# Patient Record
Sex: Female | Born: 1940 | Race: White | Hispanic: No | Smoking: Former smoker
Health system: Southern US, Community
[De-identification: ages and names within clinical notes are randomized; demographics above are authoritative.]

## PROBLEM LIST (undated history)

## (undated) DIAGNOSIS — K589 Irritable bowel syndrome without diarrhea: Secondary | ICD-10-CM

## (undated) DIAGNOSIS — K5790 Diverticulosis of intestine, part unspecified, without perforation or abscess without bleeding: Secondary | ICD-10-CM

## (undated) DIAGNOSIS — H04129 Dry eye syndrome of unspecified lacrimal gland: Secondary | ICD-10-CM

## (undated) DIAGNOSIS — Z5189 Encounter for other specified aftercare: Secondary | ICD-10-CM

## (undated) DIAGNOSIS — K449 Diaphragmatic hernia without obstruction or gangrene: Secondary | ICD-10-CM

## (undated) DIAGNOSIS — N3281 Overactive bladder: Secondary | ICD-10-CM

## (undated) DIAGNOSIS — M6289 Other specified disorders of muscle: Secondary | ICD-10-CM

## (undated) DIAGNOSIS — K861 Other chronic pancreatitis: Secondary | ICD-10-CM

## (undated) DIAGNOSIS — M4135 Thoracogenic scoliosis, thoracolumbar region: Secondary | ICD-10-CM

## (undated) DIAGNOSIS — K047 Periapical abscess without sinus: Secondary | ICD-10-CM

## (undated) DIAGNOSIS — K859 Acute pancreatitis without necrosis or infection, unspecified: Secondary | ICD-10-CM

## (undated) DIAGNOSIS — R682 Dry mouth, unspecified: Secondary | ICD-10-CM

## (undated) DIAGNOSIS — K219 Gastro-esophageal reflux disease without esophagitis: Secondary | ICD-10-CM

## (undated) DIAGNOSIS — H409 Unspecified glaucoma: Secondary | ICD-10-CM

## (undated) DIAGNOSIS — K922 Gastrointestinal hemorrhage, unspecified: Secondary | ICD-10-CM

## (undated) DIAGNOSIS — N828 Other female genital tract fistulae: Secondary | ICD-10-CM

## (undated) DIAGNOSIS — IMO0001 Reserved for inherently not codable concepts without codable children: Secondary | ICD-10-CM

## (undated) DIAGNOSIS — K648 Other hemorrhoids: Secondary | ICD-10-CM

## (undated) DIAGNOSIS — I10 Essential (primary) hypertension: Secondary | ICD-10-CM

## (undated) DIAGNOSIS — K562 Volvulus: Secondary | ICD-10-CM

## (undated) DIAGNOSIS — J9601 Acute respiratory failure with hypoxia: Secondary | ICD-10-CM

## (undated) DIAGNOSIS — N3289 Other specified disorders of bladder: Secondary | ICD-10-CM

## (undated) HISTORY — DX: Other specified disorders of bladder: N32.89

## (undated) HISTORY — DX: Encounter for other specified aftercare: Z51.89

## (undated) HISTORY — PX: HIATAL HERNIA REPAIR: SHX195

## (undated) HISTORY — DX: Acute respiratory failure with hypoxia: J96.01

## (undated) HISTORY — DX: Gastro-esophageal reflux disease without esophagitis: K21.9

## (undated) HISTORY — DX: Volvulus: K56.2

## (undated) HISTORY — DX: Irritable bowel syndrome, unspecified: K58.9

## (undated) HISTORY — DX: Diverticulosis of intestine, part unspecified, without perforation or abscess without bleeding: K57.90

## (undated) HISTORY — DX: Dry eye syndrome of unspecified lacrimal gland: H04.129

## (undated) HISTORY — PX: MOUTH SURGERY: SHX715

## (undated) HISTORY — DX: Essential (primary) hypertension: I10

## (undated) HISTORY — PX: NISSEN FUNDOPLICATION: SHX2091

## (undated) HISTORY — PX: EYE SURGERY: SHX253

## (undated) HISTORY — DX: Thoracogenic scoliosis, thoracolumbar region: M41.35

## (undated) HISTORY — DX: Overactive bladder: N32.81

## (undated) HISTORY — PX: CATARACT EXTRACTION: SUR2

## (undated) HISTORY — DX: Other specified disorders of muscle: M62.89

## (undated) HISTORY — PX: BAND HEMORRHOIDECTOMY: SHX1213

## (undated) HISTORY — DX: Other hemorrhoids: K64.8

## (undated) HISTORY — PX: STAPLE HEMORRHOIDECTOMY: SHX2438

## (undated) HISTORY — DX: Diaphragmatic hernia without obstruction or gangrene: K44.9

## (undated) HISTORY — DX: Gastrointestinal hemorrhage, unspecified: K92.2

## (undated) HISTORY — DX: Reserved for inherently not codable concepts without codable children: IMO0001

## (undated) HISTORY — DX: Other chronic pancreatitis: K86.1

---

## 1998-12-12 ENCOUNTER — Encounter: Payer: Self-pay | Admitting: Gastroenterology

## 1998-12-12 ENCOUNTER — Ambulatory Visit (HOSPITAL_COMMUNITY): Admission: RE | Admit: 1998-12-12 | Discharge: 1998-12-12 | Payer: Self-pay | Admitting: Gastroenterology

## 1999-02-27 ENCOUNTER — Ambulatory Visit (HOSPITAL_COMMUNITY): Admission: RE | Admit: 1999-02-27 | Discharge: 1999-02-27 | Payer: Self-pay | Admitting: Gastroenterology

## 1999-02-27 ENCOUNTER — Encounter: Payer: Self-pay | Admitting: Gastroenterology

## 1999-04-26 ENCOUNTER — Other Ambulatory Visit: Admission: RE | Admit: 1999-04-26 | Discharge: 1999-04-26 | Payer: Self-pay | Admitting: Family Medicine

## 1999-06-04 ENCOUNTER — Encounter: Payer: Self-pay | Admitting: Emergency Medicine

## 1999-06-04 ENCOUNTER — Emergency Department (HOSPITAL_COMMUNITY): Admission: EM | Admit: 1999-06-04 | Discharge: 1999-06-04 | Payer: Self-pay | Admitting: Emergency Medicine

## 2000-02-16 ENCOUNTER — Other Ambulatory Visit: Admission: RE | Admit: 2000-02-16 | Discharge: 2000-02-16 | Payer: Self-pay | Admitting: Family Medicine

## 2000-03-20 ENCOUNTER — Other Ambulatory Visit: Admission: RE | Admit: 2000-03-20 | Discharge: 2000-03-20 | Payer: Self-pay | Admitting: Radiology

## 2000-03-26 ENCOUNTER — Other Ambulatory Visit: Admission: RE | Admit: 2000-03-26 | Discharge: 2000-03-26 | Payer: Self-pay | Admitting: Radiology

## 2000-09-10 HISTORY — PX: BLADDER SUSPENSION: SHX72

## 2000-09-10 HISTORY — PX: VAGINAL HYSTERECTOMY: SUR661

## 2000-11-26 ENCOUNTER — Encounter: Payer: Self-pay | Admitting: Obstetrics and Gynecology

## 2000-12-02 ENCOUNTER — Encounter (INDEPENDENT_AMBULATORY_CARE_PROVIDER_SITE_OTHER): Payer: Self-pay

## 2000-12-02 ENCOUNTER — Inpatient Hospital Stay (HOSPITAL_COMMUNITY): Admission: RE | Admit: 2000-12-02 | Discharge: 2000-12-05 | Payer: Self-pay | Admitting: Obstetrics and Gynecology

## 2005-07-23 ENCOUNTER — Ambulatory Visit: Payer: Self-pay | Admitting: Gastroenterology

## 2007-01-29 ENCOUNTER — Ambulatory Visit: Payer: Self-pay | Admitting: Gastroenterology

## 2007-05-23 ENCOUNTER — Encounter: Admission: RE | Admit: 2007-05-23 | Discharge: 2007-05-23 | Payer: Self-pay | Admitting: Family Medicine

## 2007-11-17 ENCOUNTER — Ambulatory Visit: Payer: Self-pay | Admitting: Internal Medicine

## 2007-11-24 LAB — CONVERTED CEMR LAB
Basophils Absolute: 0 10*3/uL (ref 0.0–0.1)
Eosinophils Absolute: 0.1 10*3/uL (ref 0.0–0.6)
HCT: 41.2 % (ref 36.0–46.0)
Iron: 146 ug/dL — ABNORMAL HIGH (ref 42–145)
MCHC: 34.2 g/dL (ref 30.0–36.0)
MCV: 92.2 fL (ref 78.0–100.0)
Monocytes Relative: 8.5 % (ref 3.0–11.0)
Neutrophils Relative %: 74.8 % (ref 43.0–77.0)
Platelets: 239 10*3/uL (ref 150–400)
RBC: 4.47 M/uL (ref 3.87–5.11)
Saturation Ratios: 42.9 % (ref 20.0–50.0)

## 2008-01-20 ENCOUNTER — Ambulatory Visit: Payer: Self-pay | Admitting: Internal Medicine

## 2008-02-03 ENCOUNTER — Encounter: Payer: Self-pay | Admitting: Internal Medicine

## 2008-02-03 ENCOUNTER — Emergency Department (HOSPITAL_COMMUNITY): Admission: EM | Admit: 2008-02-03 | Discharge: 2008-02-04 | Payer: Self-pay | Admitting: Emergency Medicine

## 2008-02-03 ENCOUNTER — Ambulatory Visit: Payer: Self-pay | Admitting: Internal Medicine

## 2008-02-03 HISTORY — PX: COLONOSCOPY: SHX5424

## 2008-02-04 ENCOUNTER — Encounter: Payer: Self-pay | Admitting: Internal Medicine

## 2008-02-04 ENCOUNTER — Encounter (INDEPENDENT_AMBULATORY_CARE_PROVIDER_SITE_OTHER): Payer: Self-pay | Admitting: *Deleted

## 2008-02-10 ENCOUNTER — Telehealth: Payer: Self-pay | Admitting: Internal Medicine

## 2008-02-10 ENCOUNTER — Ambulatory Visit: Payer: Self-pay | Admitting: Internal Medicine

## 2008-02-11 ENCOUNTER — Telehealth: Payer: Self-pay | Admitting: Internal Medicine

## 2008-02-11 LAB — CONVERTED CEMR LAB
Eosinophils Absolute: 0.2 10*3/uL (ref 0.0–0.7)
HCT: 34.1 % — ABNORMAL LOW (ref 36.0–46.0)
Monocytes Absolute: 0.7 10*3/uL (ref 0.1–1.0)
Monocytes Relative: 7.2 % (ref 3.0–12.0)
Platelets: 433 10*3/uL — ABNORMAL HIGH (ref 150–400)
RDW: 11.7 % (ref 11.5–14.6)

## 2008-02-12 ENCOUNTER — Encounter: Payer: Self-pay | Admitting: Gastroenterology

## 2008-02-12 ENCOUNTER — Encounter: Payer: Self-pay | Admitting: Internal Medicine

## 2008-02-12 ENCOUNTER — Inpatient Hospital Stay (HOSPITAL_COMMUNITY): Admission: EM | Admit: 2008-02-12 | Discharge: 2008-02-20 | Payer: Self-pay | Admitting: Emergency Medicine

## 2008-02-12 HISTORY — PX: UPPER GASTROINTESTINAL ENDOSCOPY: SHX188

## 2008-02-16 ENCOUNTER — Encounter: Payer: Self-pay | Admitting: Gastroenterology

## 2008-02-18 ENCOUNTER — Ambulatory Visit: Payer: Self-pay | Admitting: Gastroenterology

## 2008-02-25 DIAGNOSIS — M4120 Other idiopathic scoliosis, site unspecified: Secondary | ICD-10-CM | POA: Insufficient documentation

## 2008-02-25 DIAGNOSIS — K219 Gastro-esophageal reflux disease without esophagitis: Secondary | ICD-10-CM | POA: Insufficient documentation

## 2008-02-25 DIAGNOSIS — H409 Unspecified glaucoma: Secondary | ICD-10-CM | POA: Insufficient documentation

## 2008-02-25 DIAGNOSIS — R197 Diarrhea, unspecified: Secondary | ICD-10-CM | POA: Insufficient documentation

## 2008-02-26 ENCOUNTER — Ambulatory Visit: Payer: Self-pay | Admitting: Internal Medicine

## 2008-02-27 LAB — CONVERTED CEMR LAB
Basophils Relative: 0.6 % (ref 0.0–1.0)
Eosinophils Relative: 1.5 % (ref 0.0–5.0)
Lymphocytes Relative: 38.1 % (ref 12.0–46.0)
MCV: 93.5 fL (ref 78.0–100.0)
Neutrophils Relative %: 50.3 % (ref 43.0–77.0)
RBC: 3.95 M/uL (ref 3.87–5.11)
WBC: 7 10*3/uL (ref 4.5–10.5)

## 2008-03-04 ENCOUNTER — Telehealth: Payer: Self-pay | Admitting: Internal Medicine

## 2008-03-10 ENCOUNTER — Encounter: Payer: Self-pay | Admitting: Internal Medicine

## 2008-04-12 ENCOUNTER — Ambulatory Visit: Payer: Self-pay | Admitting: Internal Medicine

## 2008-06-08 ENCOUNTER — Ambulatory Visit: Payer: Self-pay | Admitting: Internal Medicine

## 2008-06-08 DIAGNOSIS — M810 Age-related osteoporosis without current pathological fracture: Secondary | ICD-10-CM | POA: Insufficient documentation

## 2008-06-14 ENCOUNTER — Telehealth: Payer: Self-pay | Admitting: Internal Medicine

## 2008-06-15 ENCOUNTER — Ambulatory Visit: Payer: Self-pay | Admitting: Internal Medicine

## 2008-06-15 LAB — CONVERTED CEMR LAB
Prealbumin: 26.4 mg/dL (ref 18.0–45.0)
Tissue Transglutaminase Ab, IgA: 0.1 units (ref <7)

## 2008-06-17 ENCOUNTER — Encounter: Payer: Self-pay | Admitting: Internal Medicine

## 2008-09-27 ENCOUNTER — Ambulatory Visit: Payer: Self-pay | Admitting: Internal Medicine

## 2008-09-28 ENCOUNTER — Telehealth: Payer: Self-pay | Admitting: Internal Medicine

## 2008-10-04 ENCOUNTER — Ambulatory Visit: Payer: Self-pay | Admitting: Internal Medicine

## 2008-10-04 LAB — CONVERTED CEMR LAB: IgA: 148 mg/dL (ref 68–378)

## 2008-10-06 ENCOUNTER — Encounter: Payer: Self-pay | Admitting: Internal Medicine

## 2008-10-21 ENCOUNTER — Telehealth: Payer: Self-pay | Admitting: Internal Medicine

## 2008-10-27 ENCOUNTER — Encounter: Payer: Self-pay | Admitting: Internal Medicine

## 2008-11-10 ENCOUNTER — Ambulatory Visit: Payer: Self-pay | Admitting: Internal Medicine

## 2008-11-11 ENCOUNTER — Encounter: Payer: Self-pay | Admitting: Internal Medicine

## 2008-11-11 ENCOUNTER — Ambulatory Visit: Payer: Self-pay | Admitting: Internal Medicine

## 2008-11-11 HISTORY — PX: FLEXIBLE SIGMOIDOSCOPY: SHX1649

## 2008-11-19 ENCOUNTER — Encounter: Payer: Self-pay | Admitting: Internal Medicine

## 2008-11-22 ENCOUNTER — Telehealth: Payer: Self-pay | Admitting: Internal Medicine

## 2008-11-24 ENCOUNTER — Telehealth: Payer: Self-pay | Admitting: Internal Medicine

## 2008-11-29 ENCOUNTER — Telehealth: Payer: Self-pay | Admitting: Internal Medicine

## 2008-12-01 ENCOUNTER — Telehealth: Payer: Self-pay | Admitting: Internal Medicine

## 2008-12-07 ENCOUNTER — Telehealth: Payer: Self-pay | Admitting: Internal Medicine

## 2008-12-08 ENCOUNTER — Ambulatory Visit: Payer: Self-pay | Admitting: Internal Medicine

## 2008-12-09 ENCOUNTER — Telehealth: Payer: Self-pay | Admitting: Internal Medicine

## 2008-12-17 ENCOUNTER — Ambulatory Visit (HOSPITAL_COMMUNITY): Admission: RE | Admit: 2008-12-17 | Discharge: 2008-12-17 | Payer: Self-pay | Admitting: Surgery

## 2009-01-13 ENCOUNTER — Ambulatory Visit: Payer: Self-pay | Admitting: Internal Medicine

## 2009-02-03 ENCOUNTER — Telehealth: Payer: Self-pay | Admitting: Internal Medicine

## 2009-02-08 ENCOUNTER — Telehealth: Payer: Self-pay | Admitting: Internal Medicine

## 2009-02-11 ENCOUNTER — Telehealth: Payer: Self-pay | Admitting: Internal Medicine

## 2009-02-16 ENCOUNTER — Telehealth: Payer: Self-pay | Admitting: Internal Medicine

## 2009-02-17 ENCOUNTER — Telehealth: Payer: Self-pay | Admitting: Internal Medicine

## 2009-02-17 ENCOUNTER — Ambulatory Visit: Payer: Self-pay | Admitting: Internal Medicine

## 2009-02-17 DIAGNOSIS — K861 Other chronic pancreatitis: Secondary | ICD-10-CM | POA: Insufficient documentation

## 2009-02-17 DIAGNOSIS — K589 Irritable bowel syndrome without diarrhea: Secondary | ICD-10-CM

## 2009-02-19 ENCOUNTER — Encounter: Payer: Self-pay | Admitting: Physician Assistant

## 2009-02-21 LAB — CONVERTED CEMR LAB
Eosinophils Relative: 1.2 % (ref 0.0–5.0)
GFR calc non Af Amer: 105.74 mL/min (ref >60)
HCT: 40.7 % (ref 36.0–46.0)
Hemoglobin: 14.1 g/dL (ref 12.0–15.0)
Lymphs Abs: 1.1 10*3/uL (ref 0.7–4.0)
Monocytes Relative: 7.4 % (ref 3.0–12.0)
Neutro Abs: 5.2 10*3/uL (ref 1.4–7.7)
Potassium: 4.4 meq/L (ref 3.5–5.1)
Sodium: 141 meq/L (ref 135–145)
WBC: 6.9 10*3/uL (ref 4.5–10.5)

## 2009-03-08 ENCOUNTER — Ambulatory Visit: Payer: Self-pay | Admitting: Internal Medicine

## 2009-03-09 ENCOUNTER — Encounter: Payer: Self-pay | Admitting: Internal Medicine

## 2009-03-15 ENCOUNTER — Telehealth: Payer: Self-pay | Admitting: Gastroenterology

## 2009-03-16 ENCOUNTER — Telehealth: Payer: Self-pay | Admitting: Internal Medicine

## 2009-04-15 ENCOUNTER — Telehealth: Payer: Self-pay | Admitting: Internal Medicine

## 2009-04-18 ENCOUNTER — Telehealth: Payer: Self-pay | Admitting: Internal Medicine

## 2009-05-05 ENCOUNTER — Encounter: Admission: RE | Admit: 2009-05-05 | Discharge: 2009-05-05 | Payer: Self-pay | Admitting: Family Medicine

## 2009-05-18 ENCOUNTER — Ambulatory Visit: Payer: Self-pay | Admitting: Internal Medicine

## 2009-05-23 ENCOUNTER — Encounter: Payer: Self-pay | Admitting: Internal Medicine

## 2009-05-31 ENCOUNTER — Telehealth (INDEPENDENT_AMBULATORY_CARE_PROVIDER_SITE_OTHER): Payer: Self-pay | Admitting: *Deleted

## 2009-06-08 ENCOUNTER — Encounter (INDEPENDENT_AMBULATORY_CARE_PROVIDER_SITE_OTHER): Payer: Self-pay | Admitting: *Deleted

## 2009-06-08 ENCOUNTER — Ambulatory Visit: Payer: Self-pay

## 2009-06-08 ENCOUNTER — Encounter (INDEPENDENT_AMBULATORY_CARE_PROVIDER_SITE_OTHER): Payer: Self-pay | Admitting: Neurology

## 2009-06-16 ENCOUNTER — Encounter: Payer: Self-pay | Admitting: Internal Medicine

## 2009-07-11 ENCOUNTER — Encounter: Payer: Self-pay | Admitting: Internal Medicine

## 2009-08-15 ENCOUNTER — Ambulatory Visit: Payer: Self-pay | Admitting: Internal Medicine

## 2009-10-03 ENCOUNTER — Ambulatory Visit (HOSPITAL_COMMUNITY): Admission: RE | Admit: 2009-10-03 | Discharge: 2009-10-03 | Payer: Self-pay | Admitting: Family Medicine

## 2010-01-03 ENCOUNTER — Ambulatory Visit (HOSPITAL_COMMUNITY): Admission: RE | Admit: 2010-01-03 | Discharge: 2010-01-03 | Payer: Self-pay | Admitting: Family Medicine

## 2010-03-28 ENCOUNTER — Encounter (HOSPITAL_COMMUNITY): Admission: RE | Admit: 2010-03-28 | Discharge: 2010-03-28 | Payer: Self-pay | Admitting: Internal Medicine

## 2010-06-27 ENCOUNTER — Encounter (HOSPITAL_COMMUNITY)
Admission: RE | Admit: 2010-06-27 | Discharge: 2010-09-25 | Payer: Self-pay | Source: Home / Self Care | Attending: Family Medicine | Admitting: Family Medicine

## 2010-10-18 ENCOUNTER — Telehealth: Payer: Self-pay | Admitting: Internal Medicine

## 2010-10-26 ENCOUNTER — Telehealth: Payer: Self-pay | Admitting: Internal Medicine

## 2010-10-26 NOTE — Progress Notes (Signed)
Summary: Medication refill  Phone Note Call from Patient Call back at Home Phone [phone]   Caller: Patient Call For: Drenda Freeze Reason for Call: Talk to Nurse Summary of Call: pt scheduled an appt and wants to know if she can get a refill on her medication now Initial call taken by: [place] Johnson,  October 18, 2010 8:13 AM  Follow-up for Phone Call        I called patient back and told her that I did [facility] and for her to have further refills she must keep her follow up appointment with Dr. Leone Payor on March 12. Patient said that she understands and will be at her appointment on the 12th. Follow-up by: Ok Anis CMA,  October 18, 2010 9:18 AM    Prescriptions: CREON 24000 UNIT CPEP (PANCRELIPASE (LIP-PROT-AMYL)) Take 4  capsules with each meal and 1 with each snack  #330 Capsule x 0   Entered by:   Ok Anis CMA   Authorized by:   Iva Boop MD, [facility]   Signed by:   Ok Anis CMA on 10/18/2010   Method used:   [facility]  [place] [address]       [address]       [place], [place]  [address]       Ph: [phone] or [phone]       Fax: [phone]   RxID:   [id number]

## 2010-11-01 NOTE — Progress Notes (Signed)
Summary: refills  Phone Note Call from Patient Call back at Home Phone [phone]   Caller: Patient Call For: Dr Leone Payor Reason for Call: Talk to Nurse Summary of Call: Patient would like refills for her Lotonex until her appt 3-12 Initial call taken by: Tawni Levy,  October 26, 2010 11:45 AM  Follow-up for Phone Call        Patient wants to know if she can have Lotronex until her office appointment on 3-12.. Do you want me to refill this for patient. Follow-up by: Ok Anis CMA,  October 26, 2010 12:56 PM  Additional Follow-up for Phone Call Additional follow up Details #1::        Called patient and told her that it will not be until tommorrow that we can give her an answer about the Lotronex and patient said thats ok she has some pills left but not enough to last until March office visit. Patient states that she will also need refill on Creon as well.  Is it ok to refill Creon  Additional Follow-up by: Ok Anis CMA,  October 26, 2010 1:00 PM    Additional Follow-up for Phone Call Additional follow up Details #2::    refills done Follow-up by: Iva Boop MD, Clementeen Graham,  October 27, 2010 8:27 AM  Additional Follow-up for Phone Call Additional follow up Details #3:: Details for Additional Follow-up Action Taken: I called patient and let her know we sent  RX Additional Follow-up by: Ok Anis CMA,  October 27, 2010 8:38 AM  Prescriptions: CREON 24000 UNIT CPEP (PANCRELIPASE (LIP-PROT-AMYL)) Take 4  capsules with each meal and 1 with each snack  #330 Capsule x 0   Entered and Authorized by:   Iva Boop MD, [facility]   Signed by:   Iva Boop MD, [facility] on 10/27/2010   Method used:   [facility]  [place] [address]       [address]       [place], [place]  [address]       Ph: [phone] or [phone]       Fax: [phone]   RxID:   [id number] LOTRONEX 1 MG TABS (ALOSETRON HCL) 1 by mouth two times a day as needed  #60  Tablet x 0   Entered and Authorized by:   Iva Boop MD, [facility]   Signed by:   Iva Boop MD, [facility] on 10/27/2010   Method used:   [facility]  [place] [address]       [address]       [place], [place]  [address]       Ph: [phone] or [phone]       Fax: [phone]   RxID:   [id number]

## 2010-11-20 ENCOUNTER — Encounter: Payer: Self-pay | Admitting: Internal Medicine

## 2010-11-20 ENCOUNTER — Ambulatory Visit (INDEPENDENT_AMBULATORY_CARE_PROVIDER_SITE_OTHER): Payer: Medicare Other | Admitting: Internal Medicine

## 2010-11-20 DIAGNOSIS — K861 Other chronic pancreatitis: Secondary | ICD-10-CM

## 2010-11-20 DIAGNOSIS — K589 Irritable bowel syndrome without diarrhea: Secondary | ICD-10-CM

## 2010-11-28 NOTE — Assessment & Plan Note (Signed)
Summary: FU FOR MEDICATION.[name] W PT//CX POL ADVISED   History of Present Illness Visit Type: Follow-up Visit Primary GI MD: Angela Head MD [facility] Primary Provider: [name], MD Requesting Provider: n/a Chief Complaint: pancreatitis History of Present Illness:   70 yo ww with severe IBS and chronc pancreatitis for follow-up since 12.2010 visit. Weight up 13# Doing well overall, no incontinence, better quality of life on current regimen. Would like refills of Creon and Lotronex and Prilosec.    GI Review of Systems      Denies abdominal pain, acid reflux, belching, bloating, chest pain, dysphagia with liquids, dysphagia with solids, heartburn, loss of appetite, nausea, vomiting, vomiting blood, weight loss, and  weight gain.        Denies anal fissure, black tarry stools, change in bowel habit, constipation, diarrhea, diverticulosis, fecal incontinence, heme positive stool, hemorrhoids, irritable bowel syndrome, jaundice, light color stool, liver problems, rectal bleeding, and  rectal pain.    Current Medications (verified): 1)  Brimonidine Tartrate 0.2 %  Soln (Brimonidine Tartrate) .Marland Kitchen.. 1 Drop Two Times A Day Each Eye 2)  Lotrel 5-20 Mg  Caps (Amlodipine Besy-Benazepril Hcl) .Marland Kitchen.. 1 Once Daily 3)  Est Estrogens-Methyltest Ds 1.25-2.5 Mg  Tabs (Est Estrogens-Methyltest) .Marland Kitchen.. 1 Once Daily 4)  Centrum Ultra Womens  Tabs (Multiple Vitamins-Minerals) .... One Tablet By Mouth Once Daily 5)  Dorzolamide Hcl 2 %  Soln (Dorzolamide Hcl) .Marland Kitchen.. 1 Drop Each Eye 6)  Xalatan 0.005 %  Soln (Latanoprost) .Marland Kitchen.. 1 Drop Each Eye 7)  Calcium 500 Mg  Tabs (Calcium Carbonate) .... Take 1 Tablet By Mouth 2-3 Times Daily 8)  Vitamin D 78295 Unit Caps (Ergocalciferol) .... Take 1 Capsule By Mouth Every Other Week 9)  Aspirin 81 Mg Tbec (Aspirin) .... Once Daily 10)  Lotronex 1 Mg Tabs (Alosetron Hcl) .Marland Kitchen.. 1 By Mouth Two Times A Day As Needed 11)  Creon 24000 Unit Cpep (Pancrelipase (Lip-Prot-Amyl))  .... Take 3 Capsule By Mouth With Each Meal and One Capsule By Mouth With Each Snack 12)  Prilosec 20 Mg Cpdr (Omeprazole) .... Take 1 Capsule One Half Hr. Before Breakfast 13)  Imodium A-D 2 Mg Tabs (Loperamide Hcl) .... One Tablet By Mouth Once Daily May Take Two As Needed 14)  Gas Relief Extra Strength 125 Mg Caps (Simethicone) .... As Needed 15)  Benefiber Plus Calcium  Chew (Wheat Dextrin-Calcium) .... Take 2  Every Morning and Takes 2 At Bedtime 16)  Hydrocortisone Acetate 25 Mg Supp (Hydrocortisone Acetate) .... Insert 1 Supp Rectally Two Times A Day As Needed 17)  Systane Ultra 0.4-0.3 % Soln (Polyethyl Glycol-Propyl Glycol) .... 4 Drops in Each Eye Once Daily 18)  Muro 128 5 % Oint (Sodium Chloride (Hypertonic)) .... At Bedtime in Each Eye 19)  Boniva Injections .... 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 Nissen Fundoplication Oral surgery bladder tact hysterectomy  Cataract Extraction r eye hemorrhoidal banding left eye cataract extraction  Family History: Reviewed history from 03/08/2009 and no changes required. Family History of Heart Disease: Sister Family History of CVA: Father No FH of Colon  Cancer: Family History of Liver Cancer: Sister  Social History: Reviewed history from 03/08/2009 and no changes required. Patient is widowed (09/03/08) was married 48 yrs Occupation: Former Chartered loss adjuster Patient is a former smoker. -stopped 55 years ago Alcohol Use - no Patient does not get regular exercise.   Vital Signs:  Patient profile:   70 year old female Height:      56 inches Weight:      105 pounds BMI:     23.63 BSA:     1.35 Pulse rate:   60 / minute Pulse rhythm:   regular BP sitting:   122 / 64  (left arm) Cuff size:   regular  Vitals Entered By: Ok Anis CMA (November 20, 2010 10:03 AM)  Physical Exam  General:  petite and thin NAD Chest Wall:  severe kyphoscoliosis Lungs:  Clear throughout to auscultation. Heart:  Regular rate and rhythm; no murmurs, rubs,  or bruits. Abdomen:  soft and nontender, no masses BS+    Impression & Recommendations:  Problem # 1:  CHRONIC PANCREATITIS (ICD-577.1) Assessment Improved doing well on Creon. Continue  Problem # 2:  IRRITABLE BOWEL SYNDROME (ICD-564.1) Assessment: Improved Doing well on current therapy - continue Lotronex, fiber.  Problem # 3:  GERD (ICD-530.81) Assessment: Unchanged doing well, continue PPI.  Patient Instructions: 1)  Your prescriptions have been sent to your pharmacy.  2)  Please schedule a follow-up appointment in 1 year. 3)  Copy sent to : Angela Pollack, MD 4)  The medication list was reviewed and reconciled.  All changed / newly prescribed medications were explained.  A complete medication list was provided to the patient / caregiver. Prescriptions: LOTRONEX 1 MG TABS (ALOSETRON HCL) 1 by mouth two times a day as needed  #60 x 11   Entered by:   Christie Nottingham CMA (AAMA)   Authorized by:   Iva Boop MD, [facility]   Signed by:   Iva Boop MD, FACG on 11/20/2010   Method used:   [facility]  [place] [address]       [address]       [place], [place]  [address]       Ph: [phone] or [phone]       Fax: [phone]   RxID:   [id number] CREON 24000 UNIT CPEP (PANCRELIPASE (LIP-PROT-AMYL)) Take 3 capsule by mouth with each meal and one  capsule by mouth with each snack  #120 x 11   Entered by:   Christie Nottingham CMA (AAMA)   Authorized by:   Iva Boop MD, [facility]   Signed by:   Iva Boop MD, FACG on 11/20/2010   Method used:   [facility]  [place] [address]       [address]       [place], [place]  [address]       Ph: [phone] or [phone]       Fax: [phone]   RxID:   [id number] PRILOSEC 20 MG CPDR (OMEPRAZOLE) Take 1 capsule one half hr. before breakfast  #42 x 11   Entered by:   Christie Nottingham CMA (AAMA)   Authorized by:   Iva Boop MD, [facility]   Signed by:   Iva Boop MD, FACG on 11/20/2010   Method used:   [facility]  [facility]  [place] [address] (retail)       [address]       [place], [place]  [address]       Ph: [phone] or [phone]       Fax: [phone]   RxID:   [id number]

## 2010-12-12 ENCOUNTER — Encounter (HOSPITAL_COMMUNITY): Payer: Medicare Other | Attending: Family Medicine

## 2010-12-12 DIAGNOSIS — M810 Age-related osteoporosis without current pathological fracture: Secondary | ICD-10-CM | POA: Insufficient documentation

## 2010-12-20 ENCOUNTER — Telehealth: Payer: Self-pay | Admitting: Internal Medicine

## 2010-12-20 LAB — BASIC METABOLIC PANEL
CO2: 24 mEq/L (ref 19–32)
Chloride: 108 mEq/L (ref 96–112)
Creatinine, Ser: 0.65 mg/dL (ref 0.4–1.2)
GFR calc Af Amer: >60 mL/min (ref >60)
Potassium: 4 mEq/L (ref 3.5–5.1)

## 2010-12-20 LAB — HEMOGLOBIN AND HEMATOCRIT, BLOOD: HCT: 38.9 % (ref 36.0–46.0)

## 2010-12-20 NOTE — Telephone Encounter (Signed)
Pt is only getting a 10 day supply of creon at a time.  Dr Leone Payor prescribed 3 po with each meal and 1 with each snack, but he only sent in 120 pills.  I have spoke with the pharmacy and changed it back to 330 as it was previously.  I have left a message for the patient that she should be able to pick up the new montly rx.

## 2010-12-26 ENCOUNTER — Other Ambulatory Visit: Payer: Self-pay | Admitting: Internal Medicine

## 2011-01-23 NOTE — H&P (Signed)
[name], [name]                  ACCOUNT NO.:  [id number]   MEDICAL RECORD NO.:  [id number]          PATIENT TYPE:  INP   LOCATION:  1223                         FACILITY:  [facility]   PHYSICIAN:  Iva Boop, MD,FACGDATE OF BIRTH:  [birth year]   DATE OF ADMISSION:  02/11/2008  DATE OF DISCHARGE:                              HISTORY & PHYSICAL   ADDENDUM:   MEDICATIONS:  Aspirin, Prilosec, Lomotil, Benazepril, Amlodipine,  Xalatan, Alphagan.   ALLERGIES:  CODEINE.      Iva Boop, MD,FACG  Electronically Signed     CEG/MEDQ  D:  02/12/2008  T:  02/12/2008  Job:  [id number]

## 2011-01-23 NOTE — Discharge Summary (Signed)
NAMEKYLEIGH, [name]                  ACCOUNT NO.:  [id number]   MEDICAL RECORD NO.:  [id number]          PATIENT TYPE:  INP   LOCATION:  1531                         FACILITY:  [facility]   PHYSICIAN:  Barbette Hair. Arlyce Dice, MD,FACGDATE OF BIRTH:  1941-07-31   DATE OF ADMISSION:  02/11/2008  DATE OF DISCHARGE:  02/20/2008                               DISCHARGE SUMMARY   ADMITTING DIAGNOSES:  86. A 70 year old white female with acute lower gastrointestinal bleed,      etiology not clear, rule out diverticular, rule out post      colonoscopy injury.  2. Right lower quadrant pain, post colonoscopy done approximately 1      week ago, rule out transient ischemia with colonoscopy or post      colonoscopy injury.  3. Chronic diarrhea, previously attributed to chronic pancreatitis and      component of irritable bowel syndrome.  4. Acute blood loss anemia.  5. Gastroesophageal reflux disease.  6. 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  7. Glaucoma.   DISCHARGE DIAGNOSES:  1. Resolved recurrent lower gastrointestinal bleed secondary to injury      and ulceration of the sigmoid colon at the time of colonoscopy,      stable status post endoclipping.  2. Right lower quadrant pain, resolved secondary to the same process.  3. Transient upper gastrointestinal bleed during admission, probable      retch gastropathy with a huge hiatal hernia.  4. Anemia secondary to acute blood loss, stable status post      transfusions x4 I believe.  5. Other diagnoses as listed above.   CONSULTATIONS:  None.   BRIEF HISTORY:  [name]  is a pleasant 70 year old white female who  underwent colonoscopy with Dr. Juanda Chance on Feb 03, 2008, for further  evaluation of chronic diarrhea and also for polyp surveillance.  She had  been having a history of intermittent rectal bleeding as well.  Colonoscopy showed a severely tortuous colon, diverticulosis and  internal hemorrhoids, as well as a question of  proctitis.  Path specimen  showed normal random colon biopsies, but in the area of questionable  proctitis these seemed to be consistent with a solitary rectal ulcer  syndrome.  The night after her colonoscopy, she came to the emergency  room with complaints of right lower quadrant pain.  She was evaluated by  the ER.  A CT of the abdomen and pelvis was done without contrast and  showed minimal bilateral pleural effusions, small amount of free  peritoneal fluid and mild sigmoid diverticulosis.  She was discharged to  home and since then has continued to have pain, though it had not  progressed.  Her appetite has been poor and she has continued to  experience some intermittent bleeding.  She called the office, was given  Anusol-HC suppositories.  She had a CBC done, where hemoglobin went from  14-12.  On the evening of admission, she had abrupt onset of voluminous  bloody stools with clots.  She called Dr. Leone Payor, who was on-call and  was advised to come  to the emergency room.  She was seen and evaluated  in the ER per Dr. Leone Payor.  Hemoglobin initially 10, and then with  hydration was 7.6.  She was admitted to the step-down unit with an acute  lower GI bleed for stabilization, transfusions as indicated and further  diagnostic workup.  She was also still complaining of the right lower  quadrant pain and, therefore, it was suspected that she may have had an  injury to her colon at the time of the recent colonoscopy.   LABORATORY STUDIES:  On admission, white count was 16,000, platelets  500,000, BUN and creatinine normal.  Albumin 2.2, calcium 7.7, glucose  126, hemoglobin was 7.6.   She was transfused 2 units of packed RBCs.  The following morning, her  hemoglobin up to 11.4 post transfusion, however, later that morning she  started having profuse nausea and vomiting with coffee-ground emesis.  She complained of a lot of heartburn and indigestion along with this.  She had not had any  nausea or vomiting prior to admission.  She was  retching to the point that an NG was placed.  She was started on a  Protonix infusion.  We were trying to obtain a stat CT scan of the  abdomen and pelvis.  This was proceeded with contrast via the NG, and  then she also underwent upper endoscopy with Dr. Christella Hartigan later that  afternoon.  The CT scan revealed a large hiatal hernia, NG tube entirely  above the diaphragm within the gastric fundus, severe rotary scoliosis,  coarse calcification in the body of the pancreas.  The ascending colon,  transverse colon and descending colon appeared normal.  Diffuse haziness  in the peritoneum.  On the pelvic CT, there appeared to be thickening of  the sigmoid colon and indistinct serosal border.  She underwent upper  endoscopy which showed an approximate 15-cm hiatal hernia with the  majority of the stomach in her chest.  There were no obvious ulcerations  and no fresh blood in her stomach.  She was covered with IV antibiotics,  Unasyn.  Her lower GI bleeding stopped and we began very gradually  advancing her diet.  On the morning of February 16, 2008, she was  approaching potential discharge.  She unfortunately had another large  volume lower GI bleed.  She dropped her hemoglobin and was transfused 2  more units of packed RBCs with a hemoglobin up to 10.8 post transfusion.  She underwent sigmoidoscopy with Dr. Arlyce Dice on February 16, 2008, with  finding of a 5-cm segment of proximal sigmoid colon with severely  inflamed and deeply ulcerated mucosa.  There was one visible vessel with  slight oozing.  Two endoclips were applied and hemostasis was achieved.  She did have some increase in abdominal tenderness after that procedure,  which then gradually resolved.  She had no evidence for any further  active bleeding during her hospitalization.  She was continued on IV  Unasyn.  We bowel rested her for about 48 hours, and then gradually  began advancing her diet.  By  February 19, 2008, She was doing very well,  was ambulating in the room, still somewhat weak, but otherwise feeling  well and without any complaints of abdominal discomfort.  She did  require some potassium replacement, and by February 20, 2008, she was felt  stable for discharge to home with instructions to follow up with Dr.  Juanda Chance in 1 week on February 26, 2008, and to call  for any problems in the  interim.  She was to limit her activity, no strenuous activity or heavy  lifting at home.  She was to stop her baby aspirin until further notice.  She was discharged on Cipro 500 mg b.i.d. x7 days and Flagyl 250 q.i.d.  x7 days.  Other medications same as on admission, which included her  Prozac 20 mg daily, Lomotil p.r.n., Xalatan and Alphagan eye drops,  benazepril and amlodipine.   DISCHARGE DIET:  Soft, low-residue.   We did send off a celiac panel during this admission, TGG returned  within normal limits.      Amy Esterwood, PA-C      Robert D. Arlyce Dice, MD,FACG  Electronically Signed    AE/MEDQ  D:  02/20/2008  T:  02/20/2008  Job:  [id number]   cc:   Duncan Dull, M.D.  Fax: [phone]

## 2011-01-23 NOTE — Assessment & Plan Note (Signed)
[facility]                         GASTROENTEROLOGY OFFICE NOTE   NAME:[name], Angela HARMON                         MRN:          [id number]  DATE:01/29/2007                            DOB:          [birth year]    [name] comes in on May 21.  Since 2007, really no problems.  Has IBS and  some rectal incontinence.  She has had pancreatitis and GERD.  [name] has  a known large hiatal hernia, history of chronic pancreatitis, IBS for  diverticular disease and has a known very tortuous colon and has had  some previous polyps of the colon.  She is also status post  fundoplication, and she has known marked kyphosis.  She is a bus Hospital doctor.   PHYSICAL EXAMINATION:  GENERAL:  She looks about the same.  VITAL SIGNS:  Weighs 101 pounds.  Blood pressure 118/76, pulse 80 and  regular. Heart, lung and extremities are all basically unremarkable.   IMPRESSION:  1. Marked kyphosis.  2. Large hiatal hernia.  3. History of chronic pancreatitis.  4. Irritable bowel syndrome with a history of colon polyps.  5. Status post fundoplication.   RECOMMENDATIONS:  She is to continue the same medication.  I have her on  Nexium, Lomotil, and that sometime in the near future she needs to have  a colonoscopic examination with one of my colleagues after my  retirement.  She also needs blood tests if she has not had any done  recently.     Ulyess Mort, MD  Electronically Signed    SML/MedQ  DD: 01/29/2007  DT: 01/29/2007  Job #: [id number]

## 2011-01-23 NOTE — Assessment & Plan Note (Signed)
[facility]                                 ON-CALL NOTE   NAME:[name], [name]                           MRN:          [id number]  DATE:11/15/2007                            DOB:          [birth year]    Phone number is [phone].  The time of the call is 7:25 a.m.   [name] called this morning with complaints of diarrhea over the past  3 days.  She states that she has seen Dr. Victorino Dike in the past for  irritable bowel syndrome.  She has been reassigned to Dr. Lina Sar,  but has not seen Dr. Juanda Chance.  She has not been in our office for over a  year.  She sees Dr. [name] as her primary Idil Maslanka.  The patient  tells me that she has had multiple voluminous stools.  She has also had  stools occurring at night throughout the night.  She denies recent  antibiotic exposure or fevers.  No significant abdominal pain or  vomiting.  She has been using Imodium and wondered if I might call in  Lomotil, as her prescription has expired.  I told her that without  further evaluation, that I was uncomfortable treating her over the  phone.  Furthermore, I told her that diarrhea pattern (through the  course of the night...) would be inconsistent with irritable bowel.  As  such, I encouraged her to present to an urgent care for evaluation, or  contact her primary care physician who knows her more intimately.  I  also encouraged her to contact our office to establish with Dr. Juanda Chance  as soon as possible.  She was agreeable.     Wilhemina Bonito. Marina Goodell, MD  Electronically Signed    JNP/MedQ  DD: 11/15/2007  DT: 11/15/2007  Job #: [id number]   cc:   Hedwig Morton. Juanda Chance, MD

## 2011-01-23 NOTE — Op Note (Signed)
[name], [name]                  ACCOUNT NO.:  [id number]   MEDICAL RECORD NO.:  [id number]          PATIENT TYPE:  AMB   LOCATION:  DAY                          FACILITY:  [facility]   PHYSICIAN:  Thornton Park. Daphine Deutscher, MD  DATE OF BIRTH:  08-Apr-1941   DATE OF PROCEDURE:  DATE OF DISCHARGE:                               OPERATIVE REPORT   PREOPERATIVE DIAGNOSIS:  Bleeding hemorrhoids.   POSTOPERATIVE DIAGNOSIS:  Right-sided prolapsing hemorrhoids.   PROCEDURE:  Examination under anesthesia with hemorrhoidal banding x2.   SURGEON:  Thornton Park. Daphine Deutscher, M.D.   ASSISTANT:  None.   ANESTHESIA:  General.   DESCRIPTION OF PROCEDURE:  [name] is a 70 year old lady with  underlying pancreatitis and diarrhea who was taken to room-6 on Friday,  December 17, 2008, and given general anesthesia.  The perineum was prepped  with a chlorhexidine equivalent and draped sterilely.  Her hemorrhoidal  material was prolapsing on the right.  I did an EUA and did not see  really any prolapsing tissue on the left at all.  These 2 areas were so  prominent that I could almost just grasp them on the outside and take  the hemorrhoidal banding device and go down to the base and double band  them.  I did this at the 10 and 7 o'clock positions respectively.  There  was essentially no bleeding and I got good purchases.  The area was then  injected with some lidocaine, including radially all around in her anal  sphincter region for pain control.  The patient was awakened and taken  to the recovery room.  She will be given a prescription for Darvocet-N  100 to take as needed for pain.      Thornton Park Daphine Deutscher, MD  Electronically Signed     MBM/MEDQ  D:  12/17/2008  T:  12/17/2008  Job:  [id number]   cc:   Duncan Dull, M.D.  Fax: [phone]   Hedwig Morton. Juanda Chance, MD  [address]  [place]  [place] [address]

## 2011-01-23 NOTE — H&P (Signed)
[name], [name]                  ACCOUNT NO.:  [id number]   MEDICAL RECORD NO.:  [id number]          PATIENT TYPE:  INP   LOCATION:  1223                         FACILITY:  [facility]   PHYSICIAN:  Iva Boop, MD,FACGDATE OF BIRTH:  1941/01/01   DATE OF ADMISSION:  02/11/2008  DATE OF DISCHARGE:                              HISTORY & PHYSICAL   CHIEF COMPLAINT:  Passing blood.   HISTORY:  This a 70 year old white woman who underwent a colonoscopy  with biopsies on Feb 03, 2008, by Dr. Lina Sar.  The patient has  chronic diarrhea and was having a colonoscopy for polyp surveillance as  well.  She also has a history of intermittent rectal bleeding.  That  colonoscopy demonstrated a severe tortuous colon, diverticulosis, and  internal hemorrhoids, and a question of proctitis.  Pathology specimens  demonstrated normal random colon biopsies, but in the area of  questionable proctitis it was consistent with a solitary rectal ulcer  syndrome.  Spme mucosal trauma in the sigmoid was also noted. The night  after the colonoscopy, she presented to the emergency department with  right lower quadrant pain, where she was evaluated, laboratory studies  were unrevealing, a CT of the abdomen and pelvis without contrast (this  limited the exam, as well as lack of intraabdominal fat and marked  scoliosis) showed some minimal bilateral pleural effusions, a small  amount of free peritoneal fluid, within normal to physiologic fluid, and  mild sigmoid diverticulosis.  The abdominal pain has improved, though it  has been there, and she has not felt like eating much.  About 2-3 days  prior to admission, she called our office, and because of the rectal  bleeding was prescribed Anusol-HC suppositories, which she talked at  least twice.  Her hemoglobin went from 14 prior to her colonoscopy to  12.  His hemoglobin was also 14 the night of the ER evaluation.  Then  this evening, she had six voluminous bloody  bowel movements with clots.  She called me, and I directed to come to the emergency room, and in the  time between the phone call in the ER presentation she has three more  bloody stools and also had a short syncopal episode in the emergency  department waiting room.  She was given fluids and felt better.  Her  hemoglobin was 10 and then 7.6.  She has not had any melena.  There has  not been any vomiting.  She has the chronic diarrhea related to  irritable bowel and presumed pancreatic insufficiency.  She has not been  using any NSAIDs.  She does take an 81 mg aspirin daily, however, but  nothing new.  She has eyeglasses, she has been somewhat weak, and has  been anorectic as noted.  She has had a fairly steady right lower  quadrant pain that has improved but has persisted since the colonoscopy.  All other review of systems negative.   PAST MEDICAL HISTORY:  1. Adenomatous colon polyps, 2004.  2. Irritable bowel syndrome.  3. Chronic pancreatitis diagnosed by Dr. Terrial Rhodes.  4. 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  5. Glaucoma, with a history of laser treatment for that.  6. Prior vaginal hysterectomy and bladder suspension.  7. Large hiatal hernia (question failed Nissen fundoplication).  8. History of asthma side.  9. Gastroesophageal reflux disease.   SOCIAL HISTORY:  She is married.  She lives with her husband, who is  being treated for bladder cancer.  She is a former smoker.  No alcohol  use.  Former school Midwife.   FAMILY HISTORY:  Noncontributory.   PHYSICAL EXAMINATION:  GENERAL:  Reveals a pale elderly white woman,  thin, asthenic.  VITAL SIGNS:  Temperature 97.1, initial blood pressure 152/69, then  80/60, and then 90/62, pulse 104 to 84 to 71,  respirations 18.  HEENT:  Eyes:  Pupils round and reactive to light.  Pale conjunctiva.  Anicteric.  NECK:  Supple.  MOUTH:  Posterior pharynx free of lesions.  The mucosa is pale.  LUNGS:  Clear.  BACK:  Marked kyphoscoliosis.  HEART:  S1, S2.  No rubs, murmurs, or gallops.  ABDOMEN:  Soft.  Bowel sounds are present.  She has moderate tenderness  and some guarding in the right lower quadrant area and a focal area just  above McBurney is point, a little more so the midline as well.  There is  no abdominal wall hematoma.  There is no organomegaly or mass.  No  hernia detected.  LYMPH NODES:  No cervical, supraclavicular, or inguinal adenopathy  detected.  LOWER EXTREMITIES:  Free of edema.  No cyanosis or clubbing in the upper  extremities.  Nail beds are pale.  NEUROLOGIC:  Grossly nonfocal.  She is alert and oriented and x3.  PSYCHIATRIC:  Appropriate mood and affect.   LABORATORY DATA:  As described above for her hemoglobin.  Her white  blood cell count is 16,000, platelets 500,000.  BUN and creatinine are  normal.  Albumin is 2.2, calcium 7.7, appropriately down, glucose 126.  Remainder of the electrolytes normal.  Coags normal.   ASSESSMENT:  1. Gastrointestinal bleeding, lower in origin.  The presentation      sounds like a diverticular bleed.  However, she also has right      lower quadrant pain after her colonoscopy that suggests some sort      of injury, though had sort of thing usually does not bleed.      However, it is possible.  I doubt it would be from her biopsies,      but that is possible as well, but that seems awfully unlikely.      Looking at the bleeding itself, it sounds and presents most      consistent with a diverticular bleed, though a post-colonoscopy      complication causing bleeding is in the differential diagnosis.      especially with the mucosal trauma in the sigmoid colon.  2. Right lower quadrant pain after colonoscopy.  She does have a      difficult tortuous colon.  The possibility of some sort of      transient ischemia and bleeding is possible. Could be from sigmoid      trauma also.  3. Chronic diarrhea attributed to chronic  pancreatitis.  Irritable      bowel syndrome is also part of this.  The possibility of celiac      disease comes to mind when I look at this lady, but maybe that has  been tested for.  If not, I would do it at some point, though that      is not urgent.  4. Acute blood loss anemia.   PLAN:  Will admit her to step-down and continue transfusions to keep  hemoglobin 8-9.  Two units of packed red cells were ordered by the  emergency room physician and have been started.  With do serial  hemoglobins.  She may need further investigation with repeat CT  scanning, bleeding scan, or even colonoscopy.  I think it is a little  early to tell which one of these we would pursue and would like to  reassess within the next several hours after admission,  i.e., later on this morning.  The white blood cell count could be  related to the pain or to stress demargination related to the acute  hemorrhage.  Further plans pending clinical course.  A normal BUN and  creatinine go against an upper GI bleed, and I do not think that is what  is going on.      Iva Boop, MD,FACG  Electronically Signed     CEG/MEDQ  D:  02/12/2008  T:  02/12/2008  Job:  [id number]   cc:   Hedwig Morton. Juanda Chance, MD  [address]  [place]  [place] [address]

## 2011-01-23 NOTE — Assessment & Plan Note (Signed)
[facility]                                 ON-CALL NOTE   NAME:[name], [name]                           MRN:          [id number]  DATE:02/03/2008                            DOB:          [birth year]    Telephone note on [name], date of birth 05-21-1941.   Reason [name] called:  Stated that she is having abdominal pain.  She underwent colonoscopy today because of limited rectal bleeding.  She  took two Imodium in the early afternoon.  Following this, she developed  right-sided abdominal pain.  She states that it is severe.   She was instructed to go to the [facility] Emergency Room for  evaluation.     Barbette Hair. Arlyce Dice, MD,FACG  Electronically Signed    RDK/MedQ  DD: 02/03/2008  DT: 02/03/2008  Job #: [id number]   cc:   Hedwig Morton. Juanda Chance, MD

## 2011-01-23 NOTE — Assessment & Plan Note (Signed)
[facility]                         GASTROENTEROLOGY OFFICE NOTE   NAME:[name], [name]                         MRN:          [id number]  DATE:11/17/2007                            DOB:          1941-01-24    HISTORY:  Ms. Twersky is a 70 year old white female with chronic  diarrhea/IBS, who was previously evaluated.  Also a history of  pancreatitis.  She was Jaynee Eagles patient, who is switching to  my care since Dr. Richarda Osmond retirement.  She had an exacerbation of  the diarrhea for the past five days, associated with rectal bleeding.  Her last colonoscopy in August 2004, showed diminutive polyps of the  right colon and decreased rectal tone.  She had a tortuous colon.  Other  GI diagnoses were a large hiatal hernia and  chronic pancreatitis.  She  has a history of a Nissen fundoplication.  The patient has a severe  scoliosis of the thoracic and lumbar spine.   MEDICATIONS:  1. Trental one p.o. daily.  2. Xalatan eye drops.  3. Vitamin E.  4. Aspirin 81 mg p.o. daily.  5. Amlodipine.  6. Benazepril 5/20, one p.o. daily.  7. Ultrase 120 MT four times daily.   PHYSICAL EXAMINATION:  VITAL SIGNS:  Blood pressure 128/66, pulse 84,  weight 100.2 pounds and stable.  GENERAL:  The patient is thin, alert, oriented.  She has marked  kyphoscoliosis of the thoracic spine.  COR:  Normal S1 and normal S2.  ABDOMEN:  Scaphoid.  Her right rib cage was extending into above the  right iliac crest due to scoliosis.  There were no bruits, no  tenderness, no distention.  RECTAL:  Somewhat decreased rectal tone.  Stool was Hemoccult-positive.   IMPRESSION:  A 70 year old white female with chronic diarrhea due to  irritable bowel syndrome, with incontinence, due to decreased sphincter  tone.  Last colonoscopy in August 2004.   PLAN:  1. Add Questran 4 grams daily to her regimen.  2. She is due for a colonoscopy to check for adenomatous polyps.  She    will have to postpone it because of her husband's sickness.  He is      undergoing chemotherapy.  3. Switch from Ultrase to Lipram two p.o. three times daily because of      its cost effectiveness.  4. Samples of Analpram cream 2.5%, to use for rectal irritation.  5. CBC and iron studies today.     Hedwig Morton. Juanda Chance, MD  Electronically Signed    DMB/MedQ  DD: 11/17/2007  DT: 11/18/2007  Job #: [id number]   cc:   Duncan Dull, M.D.

## 2011-01-26 NOTE — Discharge Summary (Signed)
[facility]  Patient:    [name], [name]                   MRN: [id number] Adm. Date:  [id number] Disc. Date: [id number] Attending:  Lendon Colonel CC:         Barron Alvine, M.D.                           Discharge Summary  ADMISSION DIAGNOSIS:  Genital prolapse with mixed incontinence, stress and urge incontinence.  HISTORY OF PRESENT ILLNESS:  The patient is a 70 year old female, gravida 2, para 2 with pelvic relaxation, pelvic pressure, and feeling that things are falling out.  On examination, she had marked amount of descensus, second and third degree cystocele and rectocele.  She had been evaluated urologically by Dr. Isabel Caprice who felt that a sling procedure in conjunction with vaginal hysterectomy and repair would be indicated.  LABORATORY STUDIES:  Chest x-ray was within normal limits.  Cardiogram was within normal limits.  Admission hemoglobin was 13.  Metabolic profile was normal.  HOSPITAL COURSE:  The patient was admitted to the hospital and underwent an uneventful vaginal hysterectomy, A&P repair, and sling.  At the time of doing the posterior repair, a buttonhole laceration of the rectum occurred.  This was repaired without incidence and she remained afebrile.  She was informed of this on March 26, as well.  Her postoperative course was uncomplicated.  She was discharged with an indwelling suprapubic catheter.  She was to continue her Urised and Percocet.  She will come by our office in two weeks and follow up for suprapubic care with Dr. Isabel Caprice.  CONDITION ON DISCHARGE:  Improved. DD:  12/26/00 TD:  12/26/00 Job: [id number]

## 2011-01-26 NOTE — Assessment & Plan Note (Signed)
[facility]                                 ON-CALL NOTE   NAME:[name], [name]                         MRN:          [id number]  DATE:03/03/2008                            DOB:          May 03, 1941    [name] called stating that she is having bloating and some nausea  with vomiting.  She developed diarrhea early this morning for which she  took 2 Imodium.  Following this, the diarrhea subsided, but she  developed bloating and then nausea with some vomiting.  She has a  history of a colonic perforation following colonoscopy that occurred  right after [facility] Day.  She has been out of the hospital for at least  2-3 weeks.  She has had no bleeding.   I instructed [name] to not to take any more Imodium.  Should her  symptoms continue, I instructed her to call the office in the morning.  It is notable that she is without pain.     Barbette Hair. Arlyce Dice, MD,FACG  Electronically Signed    RDK/MedQ  DD: 03/03/2008  DT: 03/04/2008  Job #: [id number]

## 2011-01-26 NOTE — Op Note (Signed)
[facility]  Patient:    [name], [name]                   MRN: [id number] Proc. Date: 12/02/00 Adm. Date:  [id number] Attending:  Lendon Colonel CC:         Katherine Roan, M.D.  Feliciana Rossetti, M.D.   Operative Report  PREOPERATIVE DIAGNOSES: 1. Uterine prolapse. 2. Cystocele. 3. Rectocele. 4. Voiding dysfunction.  POSTOPERATIVE DIAGNOSES: 1. Uterine prolapse. 2. Cystocele. 3. Rectocele. 4. Voiding dysfunction.  OPERATION:  Pubovaginal sling in conjunction with vaginal hysterectomy and anterior and posterior repair performed by Dr. Kyra Manges.  SURGEON:  Barron Alvine, M.D.  ANESTHESIA:  General.  INDICATIONS:  [name] is a 70 year old female.  She was sent to see [place] by Dr. Quintella Reichert because of voiding symptoms of pelvic prolapse.  She has had severe urinary urgency with urge incontinence.  She has had typical symptoms of bladder over activity with some questionable mild leakage without awareness. Exam revealed a significant cystocele with questionable rectocele and possible uterine prolapse.  She did have some improvement with her voiding status with some antispasmodic as well as treatment of occult cystitis.  She was significantly bothered by her prolapse and was evaluated by Dr. Elana Alm.  He felt that she did require surgery.  He felt that if surgery were required, it would be reasonable to consider a pubovaginal sling in conjunction with his surgery.  I explained to [name] that, with larger cystoceles, it is not uncommon for there to be some unmasking of stress urinary incontinence with repair of the cystocele.  In situations like that, it is very reasonable to place a loose pubovaginal sling to try to prevent stress urinary incontinence from occurring postoperatively either in the immediate or more delayed time frame.  The patient appeared to understand all of these issues.  She was evaluated by Dr. Elana Alm who felt  that she should have vaginal hysterectomy with anterior and posterior repair.  For that reason, she presents now for a combined procedure.  TECHNIQUE AND FINDINGS:  When we entered the operating room, Dr. Elana Alm had already completed the vaginal hysterectomy without apparent complications and had opened the anterior vaginal wall and had exposed the cystocele.  She was in lithotomy position and had had successful induction of general endotracheal anesthesia.  We were able to enter the retropubic spaces bilaterally with simple fingertip dissection, and these tissues were noted to be very weak and attenuated.  A small incision was made over the pubic symphysis which was carried down to the level of the fascia.  The sling itself was made with a piece of cadaveric fascia lata  measuring 2.5 by approximately 10 cm.  Both ends were anchored with a #1 nylon suture.  With the Foley catheter in place to drain her bladder, we were able to pass a clamp underneath the suprapubic incision, underneath the pubic bone, and into the right-sided vaginal incision. This was done with direct digital finger control.  The nylon sutures were then grabbed and brought out the incision suprapubically.  The same thing was done on the left side.  The sling was then pexed in open position right at the bladder neck with some 3-0 Vicryl suture.  Cystoscopy revealed the sling to be in good position.  There was no evidence of bladder injury, and blue dye could be seen from both ureteral orifices.  A suprapubic tube was placed with direct visual guidance.  We  then went ahead and tied the sling very loosely over approximately two fingertips.  The wound was infiltrated with Marcaine as well as some antibiotic solution.  The skin was closed with clips.  Dr. Elana Alm then continued on with the formal anterior repair and closure of the anterior portion of the vagina.  He then plans a rectocele repair as well. This will all be  dictated by Dr. Elana Alm. DD:  12/02/00 TD:  12/02/00 Job: [id number]

## 2011-01-26 NOTE — Op Note (Signed)
[facility]  Patient:    [name], [name]                   MRN: [id number] Proc. Date: 12/02/00 Adm. Date:  [id number] Attending:  Lendon Colonel                           Operative Report  PREOPERATIVE DIAGNOSES:  Symptomatic pelvic relaxation with stress and urge incontinence.  POSTOPERATIVE DIAGNOSES:  Symptomatic pelvic relaxation with stress and urge incontinence.  OPERATION PERFORMED:  Vaginal hysterectomy, anterior and posterior repair and sling procedure, repair of button hole laceration or rectum.  DESCRIPTION OF PROCEDURE:  The patient was placed in lithotomy position, prepped and draped in the usual fashion. The cervix grasped with a tenaculum, injected with 10 cc of 1% xylocaine with epinephrine. The posterior cul-de-sac was incised with sharp dissection. The uterosacral ligaments were skeletonized and ligated with #0 chromic suture. Following this, the cardinal ligaments were clamped and ligated in a similar fashion. The anterior of the bladder was dissected off the lower segment and the anterior peritoneum was identified. The uterine arteries and upper broad ligament were clamped and ligated. The uterus was removed in a routine fashion. Both ovaries were quite normal. Following this, uterosacral ligaments were plicated in the midline with interrupted sutures of #0 Vicryl and one suture of #0 Ethibond. This supported the vaginal vault quite nicely. Following this, we closed the vagina posteriorly with figure-of-eight sutures of 3-0 Vicryl and then the peritoneum was closed with 2-0 PDS. Sponge and needle count was verified to be correct. The vagina was then dissected from the underlying pubovaginal fascia, Dr. Isabel Caprice then performed a sling procedure following which we completed the anterior repair using interrupted sutures of 3-0 Vicryl for repair of the cystocele. Following this, a wedge of the vagina was excised and then  closed with interrupted sutures of 3-0 Vicryl. The peritoneal suture was tied down at the apex of the vagina. Posterior repair was affected by wedging into the peritoneal body dissecting the vagina from the underlying rectum. Right at the apex of the vagina, a button hole laceration in the colon was encountered. ______ in the last laceration was identified, dissected free and closed with interrupted sutures of 3-0 Vicryl and the posterior repair was continued by plicating the prerectal fascia in two layers with interrupted suture of 3-0 Vicryl. The wedge of the vagina was then excised and then the vagina was closed with a running suture and an interrupted suture of 3-0 Vicryl. Hemostasis was meticulously obtained. The vagina was then packed with antibiotic soaked iodoform pack. The perineal skin was closed with interrupted and continuous 3-0 Vicryl and 3-0 chromic. The levator sling was then injected with a 0.5% Marcaine with epinephrine. [name] tolerated this procedure well and was sent to the recovery room in good condition. DD:  12/02/00 TD:  12/02/00 Job: [id number]

## 2011-01-26 NOTE — H&P (Signed)
[facility]  Patient:    [name], [name]                   MRN: [id number] Adm. Date:  [id number] Attending:  Lendon Colonel                         History and Physical  CHIEF COMPLAINT:  Pelvic pressure, stress and frequency and urge incontinence.  HISTORY OF PRESENT ILLNESS:  Ms. Busker is a 70 year old gravida 2, para 2 female who presents for a vaginal hysterectomy for pelvic relaxation.  She has pelvic pressure and a feeling that things are falling out, stress urinary incontinence and also urge incontinence.  She has been evaluated by Dr. Barron Alvine and on this admission, combined uro-gynecologic surgery will be performed in the form of vaginal hysterectomy, A&P repair and a sling.  She recently had a lower respiratory infection treated by Dr. Tinnie Gens C. Hooper with Tequin 400 mg daily.  She reports at this time she is feeling well and is prepared for surgery.  PAST MEDICAL HISTORY:  She has had oral surgery.  She is currently taking Prempro, Lopressor and Lotrel for hypertension, Prevacid for reflux and hiatal hernia.  She has a history of asthma and uses albuterol and takes Uracid to control her urinary frequency.  ALLERGIES:  She is allergic to CODEINE.  REVIEW OF SYSTEMS:  HEENT:  She wears glasses and has been treated for glaucoma in the past.  She has had laser surgery on her right eye for this. She notices no decrease in visual or auditory acuity.  No frequent headaches. HEART:  She has a history of hypertension for which she is well-compensated and under control.  No chest pain.  No shortness of breath.  No history of heart murmur.  LUNGS:  She has been treated recently for bronchitis with an albuterol inhaler and Tequin.  Reports no shortness of breath, no hemoptysis. GU:  See present illness.  She has a cystocele and uterine descensus and pelvic relaxation; this is the reason for this admission.  GI:  She has an irritable  bowel syndrome, has had a colonoscopy and evaluated extensively. This responds well to ______ .  MUSCLE, BONES AND JOINTS:  She has scoliosis but no treatment.  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  She does not smoke or drink.  FAMILY HISTORY:  Mother died at 14.  Her father died at 54.  She has two brothers living and well.  She has one sister living and well and three are deceased; one sister had cancer of the liver, the other had cancer of the brain and one sister had heart disease.  She denies diabetes in the family.  PHYSICAL EXAMINATION:  VITAL SIGNS:  Examination reveals a weight of 102.  Blood pressure 140/80.  EENT:  Examination of the eyes, ears, nose and throat is unremarkable.  The oropharynx is not injected.  NECK:  Supple.  Carotid pulses are equal without bruits.  Thyroid is not enlarged.  HEART:  Normal sinus rhythm.  No murmurs.  No heaves, thrills, rubs or gallops.  BREASTS:  No masses or tenderness.  LUNGS:  Clear to P&A.  Diaphragms move well with inspiration and expiration. I hear no wheezes at this time.  ABDOMEN:  Soft and flat.  Liver, spleen and kidneys are not palpated.  There is no tenderness and bowel sounds are normal.  No  bruits are heard.  Femoral pulses are equal bilaterally.  EXTREMITIES:  Extremities show good range of motion, equal pulses and reflexes.  PELVIC:  Examination reveals a marked amount of uterine descensus with second to third degree cystocele and moderate rectocele.  Hemoccult is negative.  No pelvic masses are noted and the uterus appears to be normal size.  NEUROLOGIC:  The patient is alert, oriented to time, place and recent events. Her cranial nerves are grossly intact.  IMPRESSION:  Symptomatic pelvic relaxation with cystocele and rectocele, stress and urge incontinence.  Patient is scheduled for a vaginal hysterectomy, anterior and posterior repair and sling procedure by Dr. Isabel Caprice.  Pap smear  recently was normal.  Patient has been given detailed informed consent.  Risks and benefits had been thoroughly discussed with patient. DD:  12/01/00 TD:  12/02/00 Job: [id number]

## 2011-02-07 ENCOUNTER — Telehealth: Payer: Self-pay | Admitting: Internal Medicine

## 2011-02-07 NOTE — Telephone Encounter (Signed)
Dr Gessner please advise? 

## 2011-02-08 ENCOUNTER — Encounter: Payer: Self-pay | Admitting: Internal Medicine

## 2011-03-13 ENCOUNTER — Ambulatory Visit (HOSPITAL_COMMUNITY): Payer: Medicare Other | Attending: Family Medicine

## 2011-03-13 DIAGNOSIS — M810 Age-related osteoporosis without current pathological fracture: Secondary | ICD-10-CM | POA: Insufficient documentation

## 2011-04-29 ENCOUNTER — Ambulatory Visit (HOSPITAL_BASED_OUTPATIENT_CLINIC_OR_DEPARTMENT_OTHER): Payer: Medicare Other | Attending: Family Medicine

## 2011-04-29 DIAGNOSIS — G4733 Obstructive sleep apnea (adult) (pediatric): Secondary | ICD-10-CM | POA: Insufficient documentation

## 2011-05-05 DIAGNOSIS — G4733 Obstructive sleep apnea (adult) (pediatric): Secondary | ICD-10-CM

## 2011-05-05 NOTE — Procedures (Signed)
NAMEKEIRAH, KONITZER                  ACCOUNT NO.:  [id number]  MEDICAL RECORD NO.:  [id number]          PATIENT TYPE:  OUT  LOCATION:  SLEEP CENTER                 FACILITY:  [facility]  PHYSICIAN:  Wofford Stratton D. Maple Hudson, MD, FCCP, FACPDATE OF BIRTH:  11-26[birth year]1940  DATE OF STUDY:  04/29/2011                           NOCTURNAL POLYSOMNOGRAM  REFERRING PHYSICIAN:  Duncan Dull, M.D.  INDICATION FOR STUDY:  Hypersomnia with sleep apnea.  EPWORTH SLEEPINESS SCORE:  Epworth sleepiness score 17/24, BMI 20.8. Weight 103 pounds, height 59 inches.  Neck 12.5 inches.  Home medications are charted and reviewed.  MEDICATIONS:  SLEEP ARCHITECTURE:  Total sleep time 360.5 minutes with sleep efficiency 97.6%.  Stage I was 2.6%, stage II 78.1%, stage III 0.7%, REM 18.6% of total sleep time.  Sleep latency 3 minutes, REM latency 61 minutes, awake after sleep onset 6 minutes, arousal index 9.5.  Bedtime medication:  Benefiber, latanoprost, Muro 128 ointment.  RESPIRATORY DATA:  Apnea/hypopnea index (AHI) 10.2 per hour.  A total of 61 events were scored including 34 obstructive apneas, 1 central apnea, 9 mixed apneas, and 17 hypopneas.  Events were seen in all sleep positions but especially while supine and then REM.  REM AHI 34.9 per hour.  There were insufficient early events to permit application of CPAP titration by split protocol on this study night.  Most events were noted after 2:00 a.m.  OXYGEN DATA:  Moderate snoring with oxygen desaturation to a nadir of 69% and a mean oxygen saturation through the study of 95.8% on room air.  CARDIAC DATA:  Normal sinus rhythm.  MOVEMENT-PARASOMNIA:  No significant movement disturbance.  No bathroom trips.  IMPRESSIONS-RECOMMENDATIONS: 1. Mild obstructive sleep apnea/hypopnea syndrome, AHI 10.2 per hour     with events in all sleep positions, especially while supine and     while in REM.  Moderate snoring with oxygen desaturation to a nadir     of 69% and a  mean saturation through the study of 95.8% on room     air. 2. She had insufficient early numbers of events to meet requirements     for initiation of CPAP titration by split     protocol on this study night.  Consider return for a dedicated CPAP     titration study or evaluate for alternative management as     clinically appropriate.     Lyric Rossano D. Maple Hudson, MD, [facility], FACP Diplomate, Biomedical engineer of Sleep Medicine Electronically Signed    CDY/MEDQ  D:  05/05/2011 12:23:06  T:  05/05/2011 17:15:27  Job:  [id number]

## 2011-06-06 LAB — CBC
HCT: 41.5
Hemoglobin: 14.6
MCHC: 35.1
MCV: 89.6
Platelets: 118 — ABNORMAL LOW
RBC: 4.63
RDW: 12.4
WBC: 8.7

## 2011-06-06 LAB — DIFFERENTIAL
Basophils Absolute: 0
Basophils Relative: 0
Eosinophils Absolute: 0
Eosinophils Relative: 0
Lymphocytes Relative: 8 — ABNORMAL LOW
Lymphs Abs: 0.7
Monocytes Absolute: 0.7
Monocytes Relative: 8
Neutro Abs: 7.3
Neutrophils Relative %: 84 — ABNORMAL HIGH

## 2011-06-07 LAB — HEMOGLOBIN AND HEMATOCRIT, BLOOD
HCT: 27.2 — ABNORMAL LOW
HCT: 28.6 — ABNORMAL LOW
HCT: 31.3 — ABNORMAL LOW
HCT: 31.9 — ABNORMAL LOW
HCT: 32.2 — ABNORMAL LOW
Hemoglobin: 10.7 — ABNORMAL LOW
Hemoglobin: 10.8 — ABNORMAL LOW
Hemoglobin: 7.6 — CL
Hemoglobin: 8.4 — ABNORMAL LOW
Hemoglobin: 9 — ABNORMAL LOW
Hemoglobin: 9.4 — ABNORMAL LOW
Hemoglobin: 9.8 — ABNORMAL LOW

## 2011-06-07 LAB — CBC
HCT: 23.2 — ABNORMAL LOW
HCT: 28 — ABNORMAL LOW
HCT: 33.2 — ABNORMAL LOW
HCT: 33.7 — ABNORMAL LOW
Hemoglobin: 10.5 — ABNORMAL LOW
Hemoglobin: 11.6 — ABNORMAL LOW
MCHC: 33.3
MCHC: 34.2
MCV: 86.6
MCV: 89.5
MCV: 90.2
Platelets: 290
Platelets: 316
Platelets: 353
Platelets: 380
Platelets: 500 — ABNORMAL HIGH
RBC: 3.49 — ABNORMAL LOW
RBC: 3.75 — ABNORMAL LOW
RDW: 12.9
RDW: 15.1
RDW: 15.3
RDW: 15.3
WBC: 5.2
WBC: 5.6

## 2011-06-07 LAB — DIFFERENTIAL
Basophils Absolute: 0
Basophils Absolute: 0
Basophils Absolute: 0.1
Basophils Relative: 1
Eosinophils Absolute: 0.1
Eosinophils Absolute: 0.2
Eosinophils Relative: 2
Lymphocytes Relative: 39
Lymphs Abs: 2.2
Lymphs Abs: 2.8
Lymphs Abs: 7.2 — ABNORMAL HIGH
Monocytes Absolute: 0.3
Monocytes Absolute: 1.1 — ABNORMAL HIGH
Monocytes Relative: 7
Monocytes Relative: 7
Neutro Abs: 2.9
Neutro Abs: 4.3
Neutro Abs: 7.8 — ABNORMAL HIGH
Neutrophils Relative %: 48
Neutrophils Relative %: 52

## 2011-06-07 LAB — CROSSMATCH
Antibody Screen: NEGATIVE
Antibody Screen: NEGATIVE

## 2011-06-07 LAB — BASIC METABOLIC PANEL
BUN: 9
Calcium: 7.5 — ABNORMAL LOW
Chloride: 108
Creatinine, Ser: 0.54
Creatinine, Ser: 0.57
Creatinine, Ser: 0.61
GFR calc Af Amer: >60
GFR calc Af Amer: >60
GFR calc non Af Amer: >60
GFR calc non Af Amer: >60
GFR calc non Af Amer: >60
Glucose, Bld: 87
Potassium: 3.7
Sodium: 138

## 2011-06-07 LAB — URINALYSIS, ROUTINE W REFLEX MICROSCOPIC
Bilirubin Urine: NEGATIVE
Glucose, UA: NEGATIVE
Hgb urine dipstick: NEGATIVE
Ketones, ur: NEGATIVE
Nitrite: NEGATIVE
Specific Gravity, Urine: 1.008
pH: 6.5

## 2011-06-07 LAB — COMPREHENSIVE METABOLIC PANEL
ALT: 68 — ABNORMAL HIGH
AST: 38 — ABNORMAL HIGH
Alkaline Phosphatase: 66
CO2: 23
Calcium: 7.7 — ABNORMAL LOW
Chloride: 112
GFR calc non Af Amer: >60
Glucose, Bld: 126 — ABNORMAL HIGH
Sodium: 140
Total Bilirubin: 0.7

## 2011-06-07 LAB — TISSUE TRANSGLUTAMINASE, IGA: Tissue Transglutaminase Ab, IgA: 0.2 U/mL (ref <7)

## 2011-06-07 LAB — PATHOLOGIST SMEAR REVIEW

## 2011-06-07 LAB — ABO/RH: ABO/RH(D): O POS

## 2011-06-07 LAB — OCCULT BLOOD X 1 CARD TO LAB, STOOL: Fecal Occult Bld: POSITIVE

## 2011-06-07 LAB — IGA: IgA: 118

## 2011-06-07 LAB — PREPARE RBC (CROSSMATCH)

## 2011-06-12 ENCOUNTER — Ambulatory Visit (HOSPITAL_COMMUNITY): Payer: Medicare Other

## 2011-08-31 ENCOUNTER — Other Ambulatory Visit: Payer: Self-pay | Admitting: Internal Medicine

## 2011-08-31 NOTE — Telephone Encounter (Signed)
Medication refilled for Prilosec OTC but patient needs to schedule an appointment in/for March 2013 according to 11/2010 office visit.

## 2011-09-13 ENCOUNTER — Other Ambulatory Visit (HOSPITAL_COMMUNITY): Payer: Self-pay | Admitting: *Deleted

## 2011-09-25 ENCOUNTER — Encounter (HOSPITAL_COMMUNITY)
Admission: RE | Admit: 2011-09-25 | Discharge: 2011-09-25 | Disposition: A | Discharge status: Home / Self Care | Payer: Medicare Other | Source: Ambulatory Visit | Attending: Family Medicine | Admitting: Family Medicine

## 2011-09-25 DIAGNOSIS — M810 Age-related osteoporosis without current pathological fracture: Secondary | ICD-10-CM | POA: Insufficient documentation

## 2011-09-25 MED ORDER — SODIUM CHLORIDE 0.9 % IV SOLN
Freq: Once | INTRAVENOUS | Status: AC
  Administered 2011-09-25: 09:00:00 via INTRAVENOUS

## 2011-09-25 MED ORDER — IBANDRONATE SODIUM 3 MG/3ML IV SOLN
3.0000 mg | Freq: Once | INTRAVENOUS | Status: AC
  Administered 2011-09-25: 3 mg via INTRAVENOUS
  Filled 2011-09-25: qty 3

## 2011-10-02 ENCOUNTER — Encounter: Payer: Self-pay | Admitting: Internal Medicine

## 2011-10-02 ENCOUNTER — Ambulatory Visit (INDEPENDENT_AMBULATORY_CARE_PROVIDER_SITE_OTHER): Payer: Medicare Other | Admitting: Internal Medicine

## 2011-10-02 VITALS — BP 110/62 | HR 80 | Ht 59.0 in | Wt 103.0 lb

## 2011-10-02 DIAGNOSIS — K649 Unspecified hemorrhoids: Secondary | ICD-10-CM

## 2011-10-02 DIAGNOSIS — R197 Diarrhea, unspecified: Secondary | ICD-10-CM

## 2011-10-02 DIAGNOSIS — K861 Other chronic pancreatitis: Secondary | ICD-10-CM

## 2011-10-02 DIAGNOSIS — K648 Other hemorrhoids: Secondary | ICD-10-CM

## 2011-10-02 DIAGNOSIS — K589 Irritable bowel syndrome without diarrhea: Secondary | ICD-10-CM

## 2011-10-02 MED ORDER — ALOSETRON HCL 1 MG PO TABS: 1.0000 mg | ORAL_TABLET | Freq: Two times a day (BID) | ORAL | Status: DC

## 2011-10-02 NOTE — Assessment & Plan Note (Signed)
This is the cause of her rectal bleeding. She has had problems like this before, and also probably the cause of her minor fecal soilage. We'll refer her back to Dr. Daphine Deutscher who is seen her for the hemorrhoids in the past.

## 2011-10-02 NOTE — Assessment & Plan Note (Signed)
Will continue her Creon.

## 2011-10-02 NOTE — Assessment & Plan Note (Signed)
Relatively stable though seems to have some increasing symptoms. She is only using her Lotronex once a day except on occasions have advised her to try that twice a day. Otherwise continue her medications.

## 2011-10-02 NOTE — Assessment & Plan Note (Signed)
Doing well, no changes 

## 2011-10-02 NOTE — Progress Notes (Signed)
  Subjective:    Patient ID: [name], female    DOB: [birth year], 71 y.o.   MRN: [id number]  HPI This pleasant lady returns for followup of IBS and suspected chronic pancreatitis. In general things are okay though she still has some mild fecal soilage, urgent defecation and is complaining of some rectal bleeding and thinks are hemorrhoids are acting up. She uses Lotronex and Creon, and some loperamide, about 1-2 each day. She is only using a Lotronex once a day most the time unless she has a flare. She also uses Benefiber. Wt Readings from Last 3 Encounters:  10/02/11 103 lb (46.72 kg)  09/25/11 103 lb (46.72 kg)  11/20/10 105 lb (47.628 kg)     Review of Systems Denies other problems    Objective:   Physical Exam General:  NAD Eyes:   anicteric Lungs:  Clear - severe kyphoscoliosis Heart:  S1S2 no rubs, murmurs or gallops Abdomen:  soft and nontender, BS+ Rectal: Protruding left lateral hemorrhoid that is prolapsing and reducible. Total mass   otherwise. Exam completed with female staff present. Ext:   no edema              Assessment & Plan:

## 2011-10-02 NOTE — Assessment & Plan Note (Signed)
Multifactorial with IBS and presumed chronic pancreatitis

## 2011-10-02 NOTE — Patient Instructions (Signed)
You have been scheduled for an appointment at [facility] with Dr. Daphine Deutscher on 11/15/11 at 10:30 am. Please arrive at 10:00 am. Please take your Lotronex twice a day.

## 2011-11-15 ENCOUNTER — Ambulatory Visit (INDEPENDENT_AMBULATORY_CARE_PROVIDER_SITE_OTHER): Payer: Medicare Other | Admitting: Surgery

## 2011-11-15 ENCOUNTER — Encounter (INDEPENDENT_AMBULATORY_CARE_PROVIDER_SITE_OTHER): Payer: Self-pay | Admitting: Surgery

## 2011-11-15 VITALS — BP 120/86 | HR 92 | Temp 99.3°F | Resp 24 | Ht 59.0 in | Wt 104.8 lb

## 2011-11-15 DIAGNOSIS — K648 Other hemorrhoids: Secondary | ICD-10-CM

## 2011-11-15 DIAGNOSIS — K649 Unspecified hemorrhoids: Secondary | ICD-10-CM

## 2011-11-15 NOTE — Progress Notes (Signed)
Chief Complaint:  Prolapsed hemorroid  History of Present Illness:  [name] is an 71 y.o. female Who is referred in by Dr. Stan Head with a prolapsed hemorrhoid. I last saw [name] in June of 2010 after I did a hemorrhoidal banding on her. This seemed to work for her well but lately she's had continued anal soilage and Dr. Chancy Hurter examined her in January and found a prolapsed hemorrhoid.  She has had some bowel issues and has benefited from [facility].  Past Medical History  Diagnosis Date  . Glaucoma   . Dry eye syndrome   . Anemia   . GI bleed   . Diverticulosis   . GERD (gastroesophageal reflux disease)   . Thoracogenic scoliosis of thoracolumbar region   . Chronic pancreatitis   . IBS (irritable bowel syndrome)   . Pelvic floor dysfunction     and rectalprolaspe  . Osteoporosis   . Hiatal hernia   . Internal hemorrhoids   . Blood transfusion   . Hypertension     Past Surgical History  Procedure Date  . Nissen fundoplication   . Vesicovaginal fistula closure w/ tah   . Abdominal hysterectomy   . Cataract extraction     bilateral  . Band hemorrhoidectomy   . Mouth surgery   . Colonoscopy 02/03/2008    diverticulosis, internal hemorrhoids  . Upper gastrointestinal endoscopy 02/12/2008    hiatal henia  . Flexible sigmoidoscopy 11/11/2008    internal hemorrhoids  . Eye surgery     lasic  . Hiatal hernia repair   . Mouth surgery     Current Outpatient Prescriptions  Medication Sig Dispense Refill  . alosetron (LOTRONEX) 1 MG tablet Take 1 tablet (1 mg total) by mouth 2 (two) times daily.      Marland Kitchen amLODipine-benazepril (LOTREL) 5-20 MG per capsule Take 1 capsule by mouth daily.      Marland Kitchen aspirin 81 MG tablet Take 81 mg by mouth daily.       . brimonidine (ALPHAGAN) 0.2 % ophthalmic solution Place 1 drop into both eyes 2 (two) times daily.      . calcium carbonate (TUMS - DOSED IN MG ELEMENTAL CALCIUM) 500 MG chewable tablet Chew 1 tablet by mouth as needed.       .  dorzolamide (TRUSOPT) 2 % ophthalmic solution Place 1 drop into both eyes.      Marland Kitchen estrogen-methylTESTOSTERone (ESTRATEST) 1.25-2.5 MG per tablet Take 1 tablet by mouth daily.      . hydrocortisone (ANUSOL-HC) 25 MG suppository Place 25 mg rectally 2 (two) times daily as needed.      . latanoprost (XALATAN) 0.005 % ophthalmic solution Place 1 drop into both eyes at bedtime.      Marland Kitchen loperamide (IMODIUM A-D) 2 MG tablet Take 2 mg by mouth as needed.      . Multiple Vitamins-Minerals (CENTRUM ULTRA WOMENS) TABS Take 1 tablet by mouth daily.      Marland Kitchen omeprazole (PRILOSEC) 20 MG capsule Take 1 capsule by mouth 1/2 hour before breakfast      . Pancrelipase, Lip-Prot-Amyl, (CREON) 24000 UNITS CPEP Take 3 capsule by mouth with each meal and 1 with each snack      . Polyethyl Glycol-Propyl Glycol (SYSTANE ULTRA) 0.4-0.3 % SOLN Place 4 drops in each eye once daily      . PRILOSEC OTC 20 MG tablet TAKE 1 CAPSULE 1/2 HOUR BEFORE BREAKFAST  42 tablet  2  . sodium chloride (MURO 128) 5 %  ophthalmic solution Place 1 drop into both eyes at bedtime.      . Vitamin D, Ergocalciferol, (DRISDOL) 50000 UNITS CAPS Take 50,000 Units by mouth every 14 (fourteen) days.       . Wheat Dextrin (BENEFIBER) TABS Take 2 tablets by mouth 2 (two) times daily.       Codeine Family History  Problem Relation Age of Onset  . Heart disease Sister   . Cancer Sister     liver  . Stroke Father   . Liver cancer Sister   . Cancer Sister     brain  . Colon cancer Neg Hx   . Kidney disease Mother   . Cancer Brother     lung   Social History:   reports that she has quit smoking. She has never used smokeless tobacco. She reports that she does not drink alcohol or use illicit drugs.   REVIEW OF SYSTEMS - PERTINENT POSITIVES ONLY: noncontributory  Physical Exam:   Blood pressure 120/86, pulse 92, temperature 99.3 F (37.4 C), temperature source Temporal, resp. rate 24, height 4\' 11"  (1.499 m), weight 104 lb 12.8 oz (47.537  kg). Body mass index is 21.17 kg/(m^2).  Gen:  WDWN white female NAD  Neurological: Alert and oriented to person, place, and time. Motor and sensory function is grossly intact  Head: Normocephalic and atraumatic.  Eyes: Conjunctivae are normal. Pupils are equal, round, and reactive to light. No scleral icterus.  Neck: Normal range of motion. Neck supple. No tracheal deviation or thyromegaly present.  Cardiovascular:  SR without murmurs or gallops.  No carotid bruits Respiratory: Effort normal.  No respiratory distress. No chest wall tenderness. Breath sounds normal.  No wheezes, rales or rhonchi.  Abdomen:  nontender GU: Rectal:  Posterior and above dentate line there is a mass on a stalk that may be a polyp or growth related to her prolapse.  Musculoskeletal: Normal range of motion. Extremities are nontender. No cyanosis, edema or clubbing noted Lymphadenopathy: No cervical, preauricular, postauricular or axillary adenopathy is present Skin: Skin is warm and dry. No rash noted. No diaphoresis. No erythema. No pallor. Pscyh: Normal mood and affect. Behavior is normal. Judgment and thought content normal.   LABORATORY RESULTS: No results found for this or any previous visit (from the past 48 hour(s)).  RADIOLOGY RESULTS: No results found.  Problem List: Patient Active Problem List  Diagnoses  . GLAUCOMA  . GERD  . CHRONIC PANCREATITIS  . OSTEOPOROSIS  . THORACOLUMBAR SCOLIOSIS, MILD  . Irritable bowel syndrome  . Diarrhea  . Hemorrhoids grade 3 prolapsing    Assessment & Plan: Exam under anesthesia and removal of rectal mass under general anesthesia. She would prefer to have this done at [place] [facility].   we'll schedule    Matt B. Daphine Deutscher, MD, [facility] [phone] beeper [phone]  11/15/2011 11:34 AM

## 2011-11-29 ENCOUNTER — Encounter (HOSPITAL_COMMUNITY): Payer: Self-pay | Admitting: Pharmacy Technician

## 2011-12-11 ENCOUNTER — Encounter (HOSPITAL_COMMUNITY): Payer: Self-pay

## 2011-12-11 ENCOUNTER — Ambulatory Visit (HOSPITAL_COMMUNITY)
Admission: RE | Admit: 2011-12-11 | Discharge: 2011-12-11 | Disposition: A | Discharge status: Home / Self Care | Payer: Medicare Other | Source: Ambulatory Visit | Attending: Surgery | Admitting: Surgery

## 2011-12-11 ENCOUNTER — Other Ambulatory Visit: Payer: Self-pay

## 2011-12-11 ENCOUNTER — Encounter (HOSPITAL_COMMUNITY)
Admission: RE | Admit: 2011-12-11 | Discharge: 2011-12-11 | Disposition: A | Discharge status: Home / Self Care | Payer: Medicare Other | Source: Ambulatory Visit | Attending: Surgery | Admitting: Surgery

## 2011-12-11 DIAGNOSIS — K649 Unspecified hemorrhoids: Secondary | ICD-10-CM | POA: Insufficient documentation

## 2011-12-11 DIAGNOSIS — Z01812 Encounter for preprocedural laboratory examination: Secondary | ICD-10-CM | POA: Insufficient documentation

## 2011-12-11 DIAGNOSIS — K449 Diaphragmatic hernia without obstruction or gangrene: Secondary | ICD-10-CM | POA: Insufficient documentation

## 2011-12-11 DIAGNOSIS — Z01810 Encounter for preprocedural cardiovascular examination: Secondary | ICD-10-CM | POA: Insufficient documentation

## 2011-12-11 DIAGNOSIS — M4120 Other idiopathic scoliosis, site unspecified: Secondary | ICD-10-CM | POA: Insufficient documentation

## 2011-12-11 DIAGNOSIS — Z01818 Encounter for other preprocedural examination: Secondary | ICD-10-CM | POA: Insufficient documentation

## 2011-12-11 DIAGNOSIS — I4460 Unspecified fascicular block: Secondary | ICD-10-CM | POA: Insufficient documentation

## 2011-12-11 HISTORY — DX: Dry mouth, unspecified: R68.2

## 2011-12-11 HISTORY — DX: Periapical abscess without sinus: K04.7

## 2011-12-11 LAB — CBC
HCT: 45 % (ref 36.0–46.0)
Hemoglobin: 15.1 g/dL — ABNORMAL HIGH (ref 12.0–15.0)
MCHC: 33.6 g/dL (ref 30.0–36.0)
MCV: 91.8 fL (ref 78.0–100.0)

## 2011-12-11 LAB — BASIC METABOLIC PANEL
BUN: 14 mg/dL (ref 6–23)
Chloride: 103 mEq/L (ref 96–112)
Creatinine, Ser: 0.6 mg/dL (ref 0.50–1.10)
GFR calc non Af Amer: >90 mL/min (ref >90)
Glucose, Bld: 86 mg/dL (ref 70–99)
Potassium: 3.8 mEq/L (ref 3.5–5.1)

## 2011-12-11 LAB — SURGICAL PCR SCREEN: Staphylococcus aureus: NEGATIVE

## 2011-12-11 NOTE — Patient Instructions (Signed)
YOUR SURGERY IS SCHEDULED ON:  Friday  4/5  AT 11:20 AM  REPORT TO [place] [facility] AT: 9:15 AM      PHONE # FOR SHORT STAY IS [phone]  DO NOT EAT OR DRINK ANYTHING AFTER MIDNIGHT THE NIGHT BEFORE YOUR SURGERY.  YOU MAY BRUSH YOUR TEETH, RINSE OUT YOUR MOUTH--BUT NO WATER, NO FOOD, NO CHEWING GUM, NO MINTS, NO CANDIES, NO CHEWING TOBACCO.  PLEASE TAKE THE FOLLOWING MEDICATIONS THE AM OF YOUR SURGERY WITH A FEW SIPS OF WATER:  TAKE ALOSETRON (LOTRONEX)  AND LOPERAMIDE (IMODIUM) AND PRILOSEC.  USE YOUR USUAL AM EYE DROPS--BRIMONIDINE AND DORZOLAMIDE.      DO NOT BRING VALUABLES, MONEY, CREDIT CARDS.  CONTACT LENS, DENTURES / PARTIALS, GLASSES SHOULD NOT BE WORN TO SURGERY AND IN MOST CASES-HEARING AIDS WILL NEED TO BE REMOVED.  BRING YOUR GLASSES CASE, ANY EQUIPMENT NEEDED FOR YOUR CONTACT LENS. FOR PATIENTS ADMITTED TO THE HOSPITAL--CHECK OUT TIME THE DAY OF DISCHARGE IS 11:00 AM.  ALL INPATIENT ROOMS ARE PRIVATE - WITH BATHROOM, TELEPHONE, TELEVISION AND WIFI INTERNET. IF YOU ARE BEING DISCHARGED THE SAME DAY OF YOUR SURGERY--YOU CAN NOT DRIVE YOURSELF HOME--AND SHOULD NOT GO HOME ALONE BY TAXI OR BUS.  NO DRIVING OR OPERATING MACHINERY FOR 24 HOURS FOLLOWING ANESTHESIA / PAIN MEDICATIONS.                            SPECIAL INSTRUCTIONS:  CHLORHEXIDINE SOAP SHOWER (other brand names are Betasept and Hibiclens ) PLEASE SHOWER WITH CHLORHEXIDINE THE NIGHT BEFORE YOUR SURGERY AND THE AM OF YOUR SURGERY. DO NOT USE CHLORHEXIDINE ON YOUR FACE OR PRIVATE AREAS--YOU MAY USE YOUR NORMAL SOAP THOSE AREAS AND YOUR NORMAL SHAMPOO.  WOMEN SHOULD AVOID SHAVING UNDER ARMS AND SHAVING LEGS 48 HOURS BEFORE USING CHLORHEXIDINE TO AVOID SKIN IRRITATION.  DO NOT USE IF ALLERGIC TO CHLORHEXIDINE.  PLEASE READ OVER ANY  FACT SHEETS THAT YOU WERE GIVEN: MRSA INFORMATION

## 2011-12-11 NOTE — Pre-Procedure Instructions (Signed)
NO PREOP ORDERS IN EPIC FROM DR. MARTIN AT THE TIME OF PT'S PREOP VISIT.  PREOP LABS CBC, BMET, EKG AND CXR WERE DONE PREOP AS PER ANESTHESIOLOGIST'S GUIDELINES.

## 2011-12-11 NOTE — Pre-Procedure Instructions (Signed)
FAXED NOTE TO DR. MARTIN'S OFFICE REQUESTING PREOP ORDERS BE ENTERED INTO EPIC.

## 2011-12-12 ENCOUNTER — Telehealth (INDEPENDENT_AMBULATORY_CARE_PROVIDER_SITE_OTHER): Payer: Self-pay | Admitting: General Surgery

## 2011-12-12 NOTE — Telephone Encounter (Signed)
Pt calling to clarify bowel prep orders and how to take the GoLytely.  Also discussed with examples what she can consume that is clear liquids.  She understands NPO after midnight on Thursday.  She will call back prn.

## 2011-12-13 ENCOUNTER — Other Ambulatory Visit (INDEPENDENT_AMBULATORY_CARE_PROVIDER_SITE_OTHER): Payer: Self-pay | Admitting: Surgery

## 2011-12-14 ENCOUNTER — Encounter (HOSPITAL_COMMUNITY)
Admission: RE | Disposition: A | Discharge status: Home / Self Care | Payer: Self-pay | Source: Ambulatory Visit | Attending: Surgery

## 2011-12-14 ENCOUNTER — Encounter (HOSPITAL_COMMUNITY): Payer: Self-pay | Admitting: Anesthesiology

## 2011-12-14 ENCOUNTER — Ambulatory Visit (HOSPITAL_COMMUNITY): Payer: Medicare Other | Admitting: Anesthesiology

## 2011-12-14 ENCOUNTER — Ambulatory Visit (HOSPITAL_COMMUNITY)
Admission: RE | Admit: 2011-12-14 | Discharge: 2011-12-14 | Disposition: A | Discharge status: Home / Self Care | Payer: Medicare Other | Source: Ambulatory Visit | Attending: Surgery | Admitting: Surgery

## 2011-12-14 ENCOUNTER — Encounter (HOSPITAL_COMMUNITY): Payer: Self-pay | Admitting: *Deleted

## 2011-12-14 DIAGNOSIS — K649 Unspecified hemorrhoids: Secondary | ICD-10-CM

## 2011-12-14 DIAGNOSIS — I10 Essential (primary) hypertension: Secondary | ICD-10-CM | POA: Insufficient documentation

## 2011-12-14 SURGERY — EXAM UNDER ANESTHESIA WITH HEMORRHOIDECTOMY
Anesthesia: General | Site: Rectum | Wound class: Clean Contaminated

## 2011-12-14 MED ORDER — LIDOCAINE HCL 1 % IJ SOLN
INTRAMUSCULAR | Status: DC | PRN
  Administered 2011-12-14: 80 mg via INTRADERMAL

## 2011-12-14 MED ORDER — PROMETHAZINE HCL 25 MG/ML IJ SOLN: 6.2500 mg | INTRAMUSCULAR | Status: DC | PRN

## 2011-12-14 MED ORDER — HYDROCODONE-ACETAMINOPHEN 5-500 MG PO TABS: 1.0000 | ORAL_TABLET | Freq: Every day | ORAL | Status: DC

## 2011-12-14 MED ORDER — HEPARIN SODIUM (PORCINE) 5000 UNIT/ML IJ SOLN
5000.0000 [IU] | Freq: Once | INTRAMUSCULAR | Status: AC
  Administered 2011-12-14: 5000 [IU] via SUBCUTANEOUS

## 2011-12-14 MED ORDER — MIDAZOLAM HCL 5 MG/5ML IJ SOLN
INTRAMUSCULAR | Status: DC | PRN
  Administered 2011-12-14: 1.5 mg via INTRAVENOUS
  Administered 2011-12-14: 1 mg via INTRAVENOUS
  Administered 2011-12-14: 0.5 mg via INTRAVENOUS

## 2011-12-14 MED ORDER — HYDROMORPHONE HCL PF 1 MG/ML IJ SOLN: 0.2500 mg | INTRAMUSCULAR | Status: DC | PRN

## 2011-12-14 MED ORDER — CEFOXITIN SODIUM-DEXTROSE 1-4 GM-% IV SOLR (PREMIX)
INTRAVENOUS | Status: AC
  Filled 2011-12-14: qty 50

## 2011-12-14 MED ORDER — LACTATED RINGERS IV SOLN: INTRAVENOUS | Status: DC

## 2011-12-14 MED ORDER — DEXTROSE 5 % IV SOLN
1.0000 g | INTRAVENOUS | Status: AC
  Administered 2011-12-14: 1 g via INTRAVENOUS

## 2011-12-14 MED ORDER — MEPERIDINE HCL 50 MG/ML IJ SOLN: 6.2500 mg | INTRAMUSCULAR | Status: DC | PRN

## 2011-12-14 MED ORDER — BUPIVACAINE LIPOSOME 1.3 % IJ SUSP
20.0000 mL | Freq: Once | INTRAMUSCULAR | Status: AC
  Administered 2011-12-14: 20 mL
  Filled 2011-12-14: qty 20

## 2011-12-14 MED ORDER — ONDANSETRON HCL 4 MG/2ML IJ SOLN
INTRAMUSCULAR | Status: DC | PRN
  Administered 2011-12-14: 4 mg via INTRAVENOUS

## 2011-12-14 MED ORDER — LACTATED RINGERS IV SOLN
INTRAVENOUS | Status: DC
  Administered 2011-12-14: 13:00:00 via INTRAVENOUS
  Administered 2011-12-14: 1000 mL via INTRAVENOUS

## 2011-12-14 MED ORDER — PROPOFOL 10 MG/ML IV EMUL
INTRAVENOUS | Status: DC | PRN
  Administered 2011-12-14: 100 mg via INTRAVENOUS

## 2011-12-14 MED ORDER — EPHEDRINE SULFATE 50 MG/ML IJ SOLN
INTRAMUSCULAR | Status: DC | PRN
  Administered 2011-12-14: 10 mg via INTRAVENOUS

## 2011-12-14 MED ORDER — HEPARIN SODIUM (PORCINE) 5000 UNIT/ML IJ SOLN
INTRAMUSCULAR | Status: AC
  Filled 2011-12-14: qty 1

## 2011-12-14 MED ORDER — FENTANYL CITRATE 0.05 MG/ML IJ SOLN
INTRAMUSCULAR | Status: DC | PRN
  Administered 2011-12-14: 50 ug via INTRAVENOUS

## 2011-12-14 MED ORDER — AMLODIPINE BESYLATE 5 MG PO TABS
5.0000 mg | ORAL_TABLET | Freq: Once | ORAL | Status: DC
  Filled 2011-12-14: qty 1

## 2011-12-14 MED ORDER — 0.9 % SODIUM CHLORIDE (POUR BTL) OPTIME
TOPICAL | Status: DC | PRN
  Administered 2011-12-14: 1000 mL

## 2011-12-14 SURGICAL SUPPLY — 36 items
BLADE HEX COATED 2.75 (ELECTRODE) ×2 IMPLANT
BLADE SURG 15 STRL LF DISP TIS (BLADE) ×1 IMPLANT
BLADE SURG 15 STRL SS (BLADE) ×2
BRIEF STRETCH FOR OB PAD LRG (UNDERPADS AND DIAPERS) ×2 IMPLANT
CANISTER SUCTION 2500CC (MISCELLANEOUS) ×2 IMPLANT
CLOTH BEACON ORANGE TIMEOUT ST (SAFETY) ×2 IMPLANT
CUTTER FLEX LINEAR 45M (STAPLE) ×1 IMPLANT
DECANTER SPIKE VIAL GLASS SM (MISCELLANEOUS) ×2 IMPLANT
DRSG PAD ABDOMINAL 8X10 ST (GAUZE/BANDAGES/DRESSINGS) IMPLANT
ELECT REM PT RETURN 9FT ADLT (ELECTROSURGICAL) ×2
ELECTRODE REM PT RTRN 9FT ADLT (ELECTROSURGICAL) ×1 IMPLANT
GAUZE SPONGE 4X4 16PLY XRAY LF (GAUZE/BANDAGES/DRESSINGS) ×2 IMPLANT
GLOVE BIOGEL M 8.0 STRL (GLOVE) ×2 IMPLANT
GLOVE BIOGEL PI IND STRL 7.0 (GLOVE) ×1 IMPLANT
GLOVE BIOGEL PI INDICATOR 7.0 (GLOVE) ×1
GOWN STRL NON-REIN LRG LVL3 (GOWN DISPOSABLE) ×2 IMPLANT
GOWN STRL REIN XL XLG (GOWN DISPOSABLE) ×4 IMPLANT
KIT BASIN OR (CUSTOM PROCEDURE TRAY) ×2 IMPLANT
LUBRICANT JELLY K Y 4OZ (MISCELLANEOUS) ×2 IMPLANT
NDL HYPO 25X1 1.5 SAFETY (NEEDLE) ×1 IMPLANT
NDL SAFETY ECLIPSE 18X1.5 (NEEDLE) IMPLANT
NEEDLE HYPO 18GX1.5 SHARP (NEEDLE)
NEEDLE HYPO 25X1 1.5 SAFETY (NEEDLE) ×2 IMPLANT
NS IRRIG 1000ML POUR BTL (IV SOLUTION) ×2 IMPLANT
PACK LITHOTOMY IV (CUSTOM PROCEDURE TRAY) ×2 IMPLANT
PENCIL BUTTON HOLSTER BLD 10FT (ELECTRODE) ×2 IMPLANT
RELOAD 45 VASCULAR/THIN (ENDOMECHANICALS) ×2 IMPLANT
RELOAD STAPLE 45 2.5 WHT GRN (ENDOMECHANICALS) IMPLANT
SPONGE GAUZE 4X4 12PLY (GAUZE/BANDAGES/DRESSINGS) ×2 IMPLANT
SPONGE SURGIFOAM ABS GEL 100 (HEMOSTASIS) IMPLANT
SPONGE SURGIFOAM ABS GEL 12-7 (HEMOSTASIS) IMPLANT
SUT CHROMIC 2 0 SH (SUTURE) IMPLANT
SUT CHROMIC 3 0 SH 27 (SUTURE) IMPLANT
SYR CONTROL 10ML LL (SYRINGE) ×2 IMPLANT
UNDERPAD 30X30 INCONTINENT (UNDERPADS AND DIAPERS) ×2 IMPLANT
YANKAUER SUCT BULB TIP 10FT TU (MISCELLANEOUS) ×2 IMPLANT

## 2011-12-14 NOTE — Op Note (Signed)
Surgeon: Wenda Low, MD, FACS  Asst:  None  Anes:  General  supplemented with 20 cc of Exparel infiltrated into the anal sphincter at the end of the case  Procedure: Examination under anesthesia, resection of polypoid mass at the dentate line posteriorly using a 4.5 mm Endo stapler and white cartridge  Diagnosis: Polyp at dentate line posteriorly  Complications: None  EBL:   Minimal cc  Description of Procedure:  Patient was taken to room 1 on Friday, 12/14/2011 and given general. She's placed in the dorsal lithotomy position and prepped with PCMX. At the previously noted area posteriorly there was a polyp arising at the dentate line. Initially in the offices was thought to possibly be a hemorrhoid it was wasn't really clear. However this appeared to be more of a polyp. I went ahead and is elected to remove it using an endostapler white load 4.5 cm to go across the base and get a clean excision. This was sent for permanent sections. There was essentially no bleeding from the staple line. The touch the proximal margin of this with the Bovie. Infiltrated the sphincter with 20 cc of Exparel. The patient was awakened taken recovery room with a Gelfoam pad in the anorectal region. She will be discharged home with pain meds.  Matt B. Daphine Deutscher, MD, [facility], [place] [phone]

## 2011-12-14 NOTE — H&P (Signed)
Chief Complaint: Prolapsed hemorroid  History of Present Illness: [name] is an 71 y.o. female Who is referred in by Dr. Stan Head with a prolapsed hemorrhoid. I last saw [name] in June of 2010 after I did a hemorrhoidal banding on her. This seemed to work for her well but lately she's had continued anal soilage and Dr. Chancy Hurter examined her in January and found a prolapsed hemorrhoid.  She has had some bowel issues and has benefited from [facility].  Past Medical History   Diagnosis  Date   .  Glaucoma    .  Dry eye syndrome    .  Anemia    .  GI bleed    .  Diverticulosis    .  GERD (gastroesophageal reflux disease)    .  Thoracogenic scoliosis of thoracolumbar region    .  Chronic pancreatitis    .  IBS (irritable bowel syndrome)    .  Pelvic floor dysfunction      and rectalprolaspe   .  Osteoporosis    .  Hiatal hernia    .  Internal hemorrhoids    .  Blood transfusion    .  Hypertension     Past Surgical History   Procedure  Date   .  Nissen fundoplication    .  Vesicovaginal fistula closure w/ tah    .  Abdominal hysterectomy    .  Cataract extraction      bilateral   .  Band hemorrhoidectomy    .  Mouth surgery    .  Colonoscopy  02/03/2008     diverticulosis, internal hemorrhoids   .  Upper gastrointestinal endoscopy  02/12/2008     hiatal henia   .  Flexible sigmoidoscopy  11/11/2008     internal hemorrhoids   .  Eye surgery      lasic   .  Hiatal hernia repair    .  Mouth surgery     Current Outpatient Prescriptions   Medication  Sig  Dispense  Refill   .  alosetron (LOTRONEX) 1 MG tablet  Take 1 tablet (1 mg total) by mouth 2 (two) times daily.     Marland Kitchen  amLODipine-benazepril (LOTREL) 5-20 MG per capsule  Take 1 capsule by mouth daily.     Marland Kitchen  aspirin 81 MG tablet  Take 81 mg by mouth daily.     .  brimonidine (ALPHAGAN) 0.2 % ophthalmic solution  Place 1 drop into both eyes 2 (two) times daily.     .  calcium carbonate (TUMS - DOSED IN MG ELEMENTAL  CALCIUM) 500 MG chewable tablet  Chew 1 tablet by mouth as needed.     .  dorzolamide (TRUSOPT) 2 % ophthalmic solution  Place 1 drop into both eyes.     Marland Kitchen  estrogen-methylTESTOSTERone (ESTRATEST) 1.25-2.5 MG per tablet  Take 1 tablet by mouth daily.     .  hydrocortisone (ANUSOL-HC) 25 MG suppository  Place 25 mg rectally 2 (two) times daily as needed.     .  latanoprost (XALATAN) 0.005 % ophthalmic solution  Place 1 drop into both eyes at bedtime.     Marland Kitchen  loperamide (IMODIUM A-D) 2 MG tablet  Take 2 mg by mouth as needed.     .  Multiple Vitamins-Minerals (CENTRUM ULTRA WOMENS) TABS  Take 1 tablet by mouth daily.     Marland Kitchen  omeprazole (PRILOSEC) 20 MG capsule  Take 1 capsule by mouth  1/2 hour before breakfast     .  Pancrelipase, Lip-Prot-Amyl, (CREON) 24000 UNITS CPEP  Take 3 capsule by mouth with each meal and 1 with each snack     .  Polyethyl Glycol-Propyl Glycol (SYSTANE ULTRA) 0.4-0.3 % SOLN  Place 4 drops in each eye once daily     .  PRILOSEC OTC 20 MG tablet  TAKE 1 CAPSULE 1/2 HOUR BEFORE BREAKFAST  42 tablet  2   .  sodium chloride (MURO 128) 5 % ophthalmic solution  Place 1 drop into both eyes at bedtime.     .  Vitamin D, Ergocalciferol, (DRISDOL) 50000 UNITS CAPS  Take 50,000 Units by mouth every 14 (fourteen) days.     .  Wheat Dextrin (BENEFIBER) TABS  Take 2 tablets by mouth 2 (two) times daily.      Codeine  Family History   Problem  Relation  Age of Onset   .  Heart disease  Sister    .  Cancer  Sister       liver    .  Stroke  Father    .  Liver cancer  Sister    .  Cancer  Sister       brain    .  Colon cancer  Neg Hx    .  Kidney disease  Mother    .  Cancer  Brother       lung    Social History: reports that she has quit smoking. She has never used smokeless tobacco. She reports that she does not drink alcohol or use illicit drugs.  REVIEW OF SYSTEMS - PERTINENT POSITIVES ONLY:  noncontributory  Physical Exam:  Blood pressure 120/86, pulse 92, temperature  99.3 F (37.4 C), temperature source Temporal, resp. rate 24, height 4\' 11"  (1.499 m), weight 104 lb 12.8 oz (47.537 kg).  Body mass index is 21.17 kg/(m^2).  Gen: WDWN white female NAD  Neurological: Alert and oriented to person, place, and time. Motor and sensory function is grossly intact  Head: Normocephalic and atraumatic.  Eyes: Conjunctivae are normal. Pupils are equal, round, and reactive to light. No scleral icterus.  Neck: Normal range of motion. Neck supple. No tracheal deviation or thyromegaly present.  Cardiovascular: SR without murmurs or gallops. No carotid bruits  Respiratory: Effort normal. No respiratory distress. No chest wall tenderness. Breath sounds normal. No wheezes, rales or rhonchi.  Abdomen: nontender  GU:  Rectal: Posterior and above dentate line there is a mass on a stalk that may be a polyp or growth related to her prolapse.  Musculoskeletal: Normal range of motion. Extremities are nontender. No cyanosis, edema or clubbing noted Lymphadenopathy: No cervical, preauricular, postauricular or axillary adenopathy is present Skin: Skin is warm and dry. No rash noted. No diaphoresis. No erythema. No pallor. Pscyh: Normal mood and affect. Behavior is normal. Judgment and thought content normal.  LABORATORY RESULTS:  No results found for this or any previous visit (from the past 48 hour(s)).  RADIOLOGY RESULTS:  No results found.  Problem List:  Patient Active Problem List   Diagnoses   .  GLAUCOMA   .  GERD   .  CHRONIC PANCREATITIS   .  OSTEOPOROSIS   .  THORACOLUMBAR SCOLIOSIS, MILD   .  Irritable bowel syndrome   .  Diarrhea   .  Hemorrhoids grade 3 prolapsing    Assessment & Plan:  Exam under anesthesia and removal of rectal mass under general anesthesia. She  would prefer to have this done at [facility] long. we'll schedule  Matt B. Daphine Deutscher, MD, [facility]  [phone] beeper  [phone]

## 2011-12-14 NOTE — Progress Notes (Signed)
Patient states compliant with bowel prep as directed by doctor's office.

## 2011-12-14 NOTE — Discharge Instructions (Signed)
You will expel the rectal pack Use Vicodin for pain Hemorrhoidectomy Hemorrhoidectomy is surgery to remove hemorrhoids. Hemorrhoids are veins that have become swollen in the rectum. The rectum is the area from the bottom end of the intestines to the opening where bowel movements leave the body. Hemorrhoids can be uncomfortable. They can cause itching, bleeding and pain if a blood clot forms in them (thrombose). If hemorrhoids are small, surgery may not be needed. But if they cover a larger area, surgery is usually suggested.  LET YOUR CAREGIVER KNOW ABOUT:   Any allergies.   All medications you are taking, including:   Herbs, eyedrops, over-the-counter medications and creams.   Blood thinners (anticoagulants), aspirin or other drugs that could affect blood clotting.   Use of steroids (by mouth or as creams).   Previous problems with anesthetics, including local anesthetics.   Possibility of pregnancy, if this applies.   Any history of blood clots.   Any history of bleeding or other blood problems.   Previous surgery.   Smoking history.   Other health problems.  RISKS AND COMPLICATIONS All surgery carries some risk. However, hemorrhoid surgery usually goes smoothly. Possible complications could include:  Urinary retention.   Bleeding.   Infection.   A painful incision.   A reaction to the anesthesia (this is not common).  BEFORE THE PROCEDURE   Stop using aspirin and non-steroidal anti-inflammatory drugs (NSAIDs) for pain relief. This includes prescription drugs and over-the-counter drugs such as ibuprofen and naproxen. Also stop taking vitamin E. If possible, do this two weeks before your surgery.   If you take blood-thinners, ask your healthcare provider when you should stop taking them.   You will probably have blood and urine tests done several days before your surgery.   Do not eat or drink for about 8 hours before the surgery.   Arrive at least an hour before  the surgery, or whenever your surgeon recommends. This will give you time to check in and fill out any needed paperwork.   Hemorrhoidectomy is often an outpatient procedure. This means you will be able to go home the same day. Sometimes, though, people stay overnight in the hospital after the procedure. Ask your surgeon what to expect. Either way, make arrangements in advance for someone to drive you home.  PROCEDURE   The preparation:   You will change into a hospital gown.   You will be given an IV. A needle will be inserted in your arm. Medication can flow directly into your body through this needle.   You might be given an enema to clear your rectum.   Once in the operating room, you will probably lie on your side or be repositioned later to lying on your stomach.   You will be given anesthesia (medication) so you will not feel anything during the surgery. The surgery often is done with local anesthesia (the area near the hemorrhoids will be numb and you will be drowsy but awake). Sometimes, general anesthesia is used (you will be asleep during the procedure).   The procedure:   There are a few different procedures for hemorrhoids. Be sure to ask you surgeon about the procedure, the risks and benefits.   Be sure to ask about what you need to do to take care of the wound, if there is one.  AFTER THE PROCEDURE  You will stay in a recovery area until the anesthesia has worn off. Your blood pressure and pulse will be checked every  so often.   You may feel a lot of pain in the area of the rectum.   Take all pain medication prescribed by your surgeon. Ask before taking any over-the-counter pain medicines.   Sometimes sitting in a warm bath can help relieve your pain.   To make sure you have bowel movements without straining:   You will probably need to take stool softeners (usually a pill) for a few days.   You should drink 8 to 10 glasses of water each day.   Your activity will  be restricted for awhile. Ask your caregiver for a list of what you should and should not do while you recover.  Document Released: 06/24/2009 Document Revised: 08/16/2011 Document Reviewed: 06/24/2009 [facility] Patient Information 2012 [place], [place].

## 2011-12-14 NOTE — Anesthesia Preprocedure Evaluation (Signed)
Anesthesia Evaluation  Patient identified by MRN, date of birth, ID band Patient awake    Reviewed: Allergy & Precautions, H&P , NPO status , Patient's Chart, lab work & pertinent test results  Airway Mallampati: III TM Distance: >3 FB Neck ROM: Full    Dental No notable dental hx. (+) Chipped and Poor Dentition   Pulmonary neg pulmonary ROS,  breath sounds clear to auscultation  Pulmonary exam normal       Cardiovascular hypertension, Pt. on medications negative cardio ROS  Rhythm:Regular Rate:Normal     Neuro/Psych negative neurological ROS  negative psych ROS   GI/Hepatic negative GI ROS, Neg liver ROS, hiatal hernia,   Endo/Other  negative endocrine ROS  Renal/GU negative Renal ROS  negative genitourinary   Musculoskeletal negative musculoskeletal ROS (+)   Abdominal   Peds negative pediatric ROS (+)  Hematology negative hematology ROS (+)   Anesthesia Other Findings   Reproductive/Obstetrics negative OB ROS                           Anesthesia Physical Anesthesia Plan  ASA: II  Anesthesia Plan: General   Post-op Pain Management:    Induction: Intravenous  Airway Management Planned:   Additional Equipment:   Intra-op Plan:   Post-operative Plan: Extubation in OR  Informed Consent: I have reviewed the patients History and Physical, chart, labs and discussed the procedure including the risks, benefits and alternatives for the proposed anesthesia with the patient or authorized representative who has indicated his/her understanding and acceptance.   Dental advisory given  Plan Discussed with: CRNA  Anesthesia Plan Comments:         Anesthesia Quick Evaluation

## 2011-12-14 NOTE — Anesthesia Postprocedure Evaluation (Signed)
  Anesthesia Post-op Note  Patient: [name]  Procedure(s) Performed: Procedure(s) (LRB): EXAM UNDER ANESTHESIA WITH HEMORRHOIDECTOMY (N/A)  Patient Location: PACU  Anesthesia Type: General  Level of Consciousness: awake and alert   Airway and Oxygen Therapy: Patient Spontanous Breathing  Post-op Pain: mild  Post-op Assessment: Post-op Vital signs reviewed, Patient's Cardiovascular Status Stable, Respiratory Function Stable, Patent Airway and No signs of Nausea or vomiting  Post-op Vital Signs: stable  Complications: No apparent anesthesia complications

## 2011-12-14 NOTE — Transfer of Care (Signed)
Immediate Anesthesia Transfer of Care Note  Patient: [name]  Procedure(s) Performed: Procedure(s) (LRB): EXAM UNDER ANESTHESIA WITH HEMORRHOIDECTOMY (N/A)  Patient Location: PACU  Anesthesia Type: General  Level of Consciousness: awake, alert  and oriented  Airway & Oxygen Therapy: Patient Spontanous Breathing and Patient connected to face mask oxygen  Post-op Assessment: Report given to PACU RN and Post -op Vital signs reviewed and stable  Post vital signs: Reviewed and stable  Complications: No apparent anesthesia complications

## 2011-12-17 ENCOUNTER — Telehealth (INDEPENDENT_AMBULATORY_CARE_PROVIDER_SITE_OTHER): Payer: Self-pay | Admitting: Surgery

## 2011-12-18 ENCOUNTER — Other Ambulatory Visit: Payer: Self-pay

## 2011-12-18 MED ORDER — PANCRELIPASE (LIP-PROT-AMYL) 24000-76000 UNITS PO CPEP: 1.0000 | ORAL_CAPSULE | ORAL | Status: DC

## 2011-12-19 ENCOUNTER — Other Ambulatory Visit: Payer: Self-pay | Admitting: Internal Medicine

## 2011-12-25 ENCOUNTER — Encounter (HOSPITAL_COMMUNITY)
Admission: RE | Admit: 2011-12-25 | Discharge: 2011-12-25 | Disposition: A | Discharge status: Home / Self Care | Payer: Medicare Other | Source: Ambulatory Visit | Attending: Family Medicine | Admitting: Family Medicine

## 2011-12-25 ENCOUNTER — Encounter (HOSPITAL_COMMUNITY): Payer: Self-pay

## 2011-12-25 ENCOUNTER — Encounter (INDEPENDENT_AMBULATORY_CARE_PROVIDER_SITE_OTHER): Payer: Self-pay | Admitting: Surgery

## 2011-12-25 DIAGNOSIS — M810 Age-related osteoporosis without current pathological fracture: Secondary | ICD-10-CM | POA: Insufficient documentation

## 2011-12-25 MED ORDER — IBANDRONATE SODIUM 3 MG/3ML IV SOLN
INTRAVENOUS | Status: AC
  Filled 2011-12-25: qty 3

## 2011-12-25 MED ORDER — SODIUM CHLORIDE 0.9 % IV SOLN
INTRAVENOUS | Status: AC
  Administered 2011-12-25: 250 mL via INTRAVENOUS

## 2011-12-25 MED ORDER — IBANDRONATE SODIUM 3 MG/3ML IV SOLN
3.0000 mg | Freq: Once | INTRAVENOUS | Status: AC
  Administered 2011-12-25: 3 mg via INTRAVENOUS

## 2011-12-25 NOTE — Discharge Instructions (Signed)
Ibandronate injection What is this medicine? IBANDRONATE (i BAN droh nate) slows calcium loss from bones. It is used to treat osteoporosis in women past the age of menopause. This medicine may be used for other purposes; ask your health care provider or pharmacist if you have questions. What should I tell my health care provider before I take this medicine? They need to know if you have any of these conditions: -dental disease -kidney disease -low levels of calcium in the blood -low levels of vitamin D in the blood -an unusual or allergic reaction to ibandronate, other medicines, foods, dyes, or preservatives -pregnant or trying to get pregnant -breast-feeding How should I use this medicine? This medicine is for injection into a vein. It is given by a health care professional in a hospital or clinic setting. Talk to your pediatrician regarding the use of this medicine in children. Special care may be needed. Overdosage: If you think you have taken too much of this medicine contact a poison control center or emergency room at once. NOTE: This medicine is only for you. Do not share this medicine with others. What if I miss a dose? It is important not to miss your dose. Call your doctor or health care professional if you are unable to keep an appointment. What may interact with this medicine? -teriparatide This list may not describe all possible interactions. Give your health care provider a list of all the medicines, herbs, non-prescription drugs, or dietary supplements you use. Also tell them if you smoke, drink alcohol, or use illegal drugs. Some items may interact with your medicine. What should I watch for while using this medicine? Visit your doctor or health care professional for regular check ups. It may be some time before you see the benefit from this medicine. Do not stop taking your medicine except on your doctor's advice. Your doctor or health care professional may order blood tests  and other tests to see how you are doing. You should make sure you get enough calcium and vitamin D while you are taking this medicine, unless your doctor tells you not to. Discuss the foods you eat and the vitamins you take with your health care professional. Some people who take this medicine have severe bone, joint, and/or muscle pain. This medicine may also increase your risk for a broken thigh bone. Tell your doctor right away if you have pain in your upper leg or groin. Tell your doctor if you have any pain that does not go away or that gets worse. What side effects may I notice from receiving this medicine? Side effects that you should report to your doctor or health care professional as soon as possible: -allergic reactions such as skin rash or itching, hives, swelling of the face, lips, throat, or tongue -changes in vision -chest pain -fever, flu-like symptoms -heartburn or stomach pain -jaw pain, especially after dental work Side effects that usually do not require medical attention (report to your doctor or health care professional if they continue or are bothersome): -bone, muscle or joint pain -diarrhea or constipation -eye pain or itching -headache -irritation at site where injected -nausea This list may not describe all possible side effects. Call your doctor for medical advice about side effects. You may report side effects to FDA at 1-800-FDA-1088. Where should I keep my medicine? This drug is given in a hospital or clinic and will not be stored at home. NOTE: This sheet is a summary. It may not cover all possible information.   If you have questions about this medicine, talk to your doctor, pharmacist, or health care provider.  2012, Elsevier/Gold Standard. (02/23/2011 9:02:20 AM) 

## 2012-01-02 ENCOUNTER — Encounter (INDEPENDENT_AMBULATORY_CARE_PROVIDER_SITE_OTHER): Payer: Self-pay | Admitting: Surgery

## 2012-01-02 ENCOUNTER — Ambulatory Visit (INDEPENDENT_AMBULATORY_CARE_PROVIDER_SITE_OTHER): Payer: Medicare Other | Admitting: Surgery

## 2012-01-02 VITALS — BP 116/87 | HR 118 | Temp 99.6°F | Ht 59.0 in | Wt 99.4 lb

## 2012-01-02 DIAGNOSIS — K649 Unspecified hemorrhoids: Secondary | ICD-10-CM

## 2012-01-02 DIAGNOSIS — K648 Other hemorrhoids: Secondary | ICD-10-CM

## 2012-01-02 NOTE — Progress Notes (Signed)
[name] 71 y.o.  Body mass index is 20.08 kg/(m^2).  Patient Active Problem List  Diagnoses  . GLAUCOMA  . GERD  . CHRONIC PANCREATITIS  . OSTEOPOROSIS  . THORACOLUMBAR SCOLIOSIS, MILD  . Irritable bowel syndrome  . Diarrhea  . Hemorrhoids grade 3 prolapsing    Allergies  Allergen Reactions  . Codeine     REACTION: Dlizzy    Past Surgical History  Procedure Date  . Nissen fundoplication   . Vesicovaginal fistula closure w/ tah   . Abdominal hysterectomy   . Cataract extraction     bilateral  . Band hemorrhoidectomy   . Mouth surgery   . Colonoscopy 02/03/2008    diverticulosis, internal hemorrhoids  . Upper gastrointestinal endoscopy 02/12/2008    hiatal henia  . Flexible sigmoidoscopy 11/11/2008    internal hemorrhoids  . Hiatal hernia repair   . Mouth surgery   . Eye surgery     LASER OF BOTH EYES  FOR ELEVATED PRESSURE   Hollice Espy, MD, MD No diagnosis found.  Path of resected specimen was hemorrhoid.  Doing well.  Return prn. Matt B. Daphine Deutscher, MD, [facility] [phone] beeper [phone]  01/02/2012 3:07 PM

## 2012-01-08 ENCOUNTER — Other Ambulatory Visit: Payer: Self-pay | Admitting: Internal Medicine

## 2012-03-25 ENCOUNTER — Encounter (HOSPITAL_COMMUNITY)
Admission: RE | Admit: 2012-03-25 | Discharge: 2012-03-25 | Disposition: A | Discharge status: Home / Self Care | Payer: Medicare Other | Source: Ambulatory Visit | Attending: Family Medicine | Admitting: Family Medicine

## 2012-03-25 ENCOUNTER — Encounter (HOSPITAL_COMMUNITY): Payer: Self-pay

## 2012-03-25 DIAGNOSIS — M810 Age-related osteoporosis without current pathological fracture: Secondary | ICD-10-CM | POA: Insufficient documentation

## 2012-03-25 MED ORDER — SODIUM CHLORIDE 0.9 % IV SOLN
Freq: Once | INTRAVENOUS | Status: AC
  Administered 2012-03-25: 10:00:00 via INTRAVENOUS

## 2012-03-25 MED ORDER — IBANDRONATE SODIUM 3 MG/3ML IV SOLN
INTRAVENOUS | Status: AC
  Administered 2012-03-25: 3 mg via INTRAVENOUS
  Filled 2012-03-25: qty 3

## 2012-03-25 MED ORDER — IBANDRONATE SODIUM 3 MG/3ML IV SOLN
3.0000 mg | Freq: Once | INTRAVENOUS | Status: AC
  Administered 2012-03-25: 3 mg via INTRAVENOUS

## 2012-05-13 ENCOUNTER — Other Ambulatory Visit: Payer: Self-pay | Admitting: Internal Medicine

## 2012-06-24 ENCOUNTER — Encounter (HOSPITAL_COMMUNITY)
Admission: RE | Admit: 2012-06-24 | Discharge: 2012-06-24 | Disposition: A | Discharge status: Home / Self Care | Payer: Medicare Other | Source: Ambulatory Visit | Attending: Family Medicine | Admitting: Family Medicine

## 2012-06-24 ENCOUNTER — Encounter (HOSPITAL_COMMUNITY): Payer: Self-pay

## 2012-06-24 DIAGNOSIS — M810 Age-related osteoporosis without current pathological fracture: Secondary | ICD-10-CM | POA: Insufficient documentation

## 2012-06-24 MED ORDER — SODIUM CHLORIDE 0.9 % IV SOLN
INTRAVENOUS | Status: DC
  Administered 2012-06-24: 10:00:00 via INTRAVENOUS

## 2012-06-24 MED ORDER — IBANDRONATE SODIUM 3 MG/3ML IV SOLN
3.0000 mg | Freq: Once | INTRAVENOUS | Status: AC
  Administered 2012-06-24: 3 mg via INTRAVENOUS
  Filled 2012-06-24: qty 3

## 2012-07-01 ENCOUNTER — Ambulatory Visit (HOSPITAL_COMMUNITY): Payer: Medicare Other

## 2012-07-02 ENCOUNTER — Other Ambulatory Visit: Payer: Self-pay | Admitting: Internal Medicine

## 2012-08-15 ENCOUNTER — Other Ambulatory Visit: Payer: Self-pay | Admitting: Internal Medicine

## 2012-08-15 NOTE — Telephone Encounter (Signed)
Spoke with patient and told her that creon sent in for the #330 as requested and her yearly follow up appointment made for January 2014.

## 2012-09-13 ENCOUNTER — Other Ambulatory Visit: Payer: Self-pay | Admitting: Internal Medicine

## 2012-09-24 ENCOUNTER — Encounter (HOSPITAL_COMMUNITY)
Admission: RE | Admit: 2012-09-24 | Discharge: 2012-09-24 | Disposition: A | Discharge status: Home / Self Care | Payer: Medicare Other | Source: Ambulatory Visit | Attending: Family Medicine | Admitting: Family Medicine

## 2012-09-24 ENCOUNTER — Encounter (HOSPITAL_COMMUNITY): Payer: Self-pay

## 2012-09-24 DIAGNOSIS — M810 Age-related osteoporosis without current pathological fracture: Secondary | ICD-10-CM | POA: Insufficient documentation

## 2012-09-24 MED ORDER — SODIUM CHLORIDE 0.9 % IV SOLN
INTRAVENOUS | Status: AC
  Administered 2012-09-24: 10:00:00 via INTRAVENOUS

## 2012-09-24 MED ORDER — IBANDRONATE SODIUM 3 MG/3ML IV SOLN
3.0000 mg | Freq: Once | INTRAVENOUS | Status: AC
  Administered 2012-09-24: 3 mg via INTRAVENOUS
  Filled 2012-09-24: qty 3

## 2012-09-25 ENCOUNTER — Encounter: Payer: Self-pay | Admitting: Internal Medicine

## 2012-09-25 ENCOUNTER — Ambulatory Visit (INDEPENDENT_AMBULATORY_CARE_PROVIDER_SITE_OTHER): Payer: Medicare Other | Admitting: Internal Medicine

## 2012-09-25 VITALS — BP 100/70 | HR 68 | Ht 59.0 in | Wt 99.8 lb

## 2012-09-25 DIAGNOSIS — K219 Gastro-esophageal reflux disease without esophagitis: Secondary | ICD-10-CM

## 2012-09-25 DIAGNOSIS — K589 Irritable bowel syndrome without diarrhea: Secondary | ICD-10-CM

## 2012-09-25 DIAGNOSIS — N8184 Pelvic muscle wasting: Secondary | ICD-10-CM

## 2012-09-25 DIAGNOSIS — M6289 Other specified disorders of muscle: Secondary | ICD-10-CM

## 2012-09-25 DIAGNOSIS — K861 Other chronic pancreatitis: Secondary | ICD-10-CM

## 2012-09-25 MED ORDER — OMEPRAZOLE 20 MG PO CPDR: 20.0000 mg | DELAYED_RELEASE_CAPSULE | Freq: Every day | ORAL | Status: DC

## 2012-09-25 NOTE — Patient Instructions (Addendum)
Today you have been given a written rx for generic Prilosec to take to your pharmacy when needed.  Follow up with [place] in a year.  Thank you for choosing me and [facility].  Iva Boop, M.D., [facility]

## 2012-09-25 NOTE — Progress Notes (Signed)
  Subjective:    Patient ID: [name], female    DOB: [birth year], 72 y.o.   MRN: [id number]  HPI She returns for follow up of GERD, IBS and chronic pancreatitis. Formulary change necessitates changing Prilosec OTC to omeprazole. She continues to do well with formed bowel movements and no incontinence. Benefiber has helped in conjunction with Lotronex but Lotronex is $99 copay. Creon still works well. Medications, allergies, past medical history, past surgical history, family history and social history are reviewed and updated in the EMR.   Review of Systems As above    Objective:   Physical Exam Elderly slight woman NAD Abdomen soft and nontender  Wt Readings from Last 3 Encounters:  09/25/12 99 lb 12.8 oz (45.269 kg)  06/24/12 95 lb (43.092 kg)  03/25/12 100 lb (45.36 kg)        Assessment & Plan:   1. IBS (irritable bowel syndrome)   2. GERD (gastroesophageal reflux disease)   3. Chronic pancreatitis   4. Pelvic floor dysfunction    1. Change Prilosec OTC to omeprazole 20 mg daily 2. Continue other medications as she is doing well with these conditions 3. See me in 1 year routine, sooner prn

## 2012-10-29 ENCOUNTER — Other Ambulatory Visit: Payer: Self-pay

## 2012-10-29 MED ORDER — ALOSETRON HCL 1 MG PO TABS: ORAL_TABLET | ORAL | Status: DC

## 2012-10-29 MED ORDER — PANCRELIPASE (LIP-PROT-AMYL) 24000-76000 UNITS PO CPEP: ORAL_CAPSULE | ORAL | Status: DC

## 2012-10-29 MED ORDER — OMEPRAZOLE 20 MG PO CPDR: 20.0000 mg | DELAYED_RELEASE_CAPSULE | Freq: Every day | ORAL | Status: DC

## 2012-10-29 NOTE — Telephone Encounter (Signed)
Sent as patient requested refills on Lotronex, creon, and omeprazole to [facility].

## 2012-12-01 ENCOUNTER — Telehealth: Payer: Self-pay | Admitting: Internal Medicine

## 2012-12-01 NOTE — Telephone Encounter (Signed)
LM for patient stating that I will work on a prior authorization tomorrow for her Lotronex and I will be in touch.

## 2012-12-03 NOTE — Telephone Encounter (Signed)
Spoke to Mott at [facility] at # [phone] and started the prior authorization, it is now being sent to the pharmacist for review and they will let us know their decision.

## 2012-12-04 NOTE — Telephone Encounter (Signed)
Pt called to remind [place] -She [facility]  [place] not [facility].

## 2012-12-05 NOTE — Telephone Encounter (Signed)
LM for patient that we are awaiting to hear back on the prior authorization for her Lotronex.

## 2012-12-11 MED ORDER — ALOSETRON HCL 1 MG PO TABS: ORAL_TABLET | ORAL | Status: DC

## 2012-12-11 NOTE — Telephone Encounter (Signed)
Patient requests that rx be given to local pharmacy instead of [facility]. I have spoken to Paulding, pharmacist at [facility]. She states that she will discontinue the lotronex prescription in their system. I have also called and spoken to [facility] to inquire as to whether Lotronex prior authorization was ever approved. Per Jeronimo Norma, Lotronex 1 mg was approved on 12/04/12 through 02/25/13 and a confirmation of this is to be faxed to our office. I have spoken to patient to advise her of the above. I have also advised that she will need to pick up Lotronex prescription as the pharmacy requires we add a Lotronex Program sticker to the RX before they will fill. Patient states that she will come pick this up today.

## 2012-12-16 ENCOUNTER — Other Ambulatory Visit (HOSPITAL_COMMUNITY): Payer: Self-pay | Admitting: Family Medicine

## 2012-12-17 ENCOUNTER — Encounter (HOSPITAL_COMMUNITY)
Admission: RE | Admit: 2012-12-17 | Discharge: 2012-12-17 | Disposition: A | Discharge status: Home / Self Care | Payer: Medicare Other | Source: Ambulatory Visit | Attending: Family Medicine | Admitting: Family Medicine

## 2012-12-17 DIAGNOSIS — M810 Age-related osteoporosis without current pathological fracture: Secondary | ICD-10-CM | POA: Insufficient documentation

## 2012-12-17 MED ORDER — IBANDRONATE SODIUM 3 MG/3ML IV SOLN
3.0000 mg | Freq: Once | INTRAVENOUS | Status: AC
  Administered 2012-12-17: 3 mg via INTRAVENOUS
  Filled 2012-12-17: qty 3

## 2012-12-17 MED ORDER — SODIUM CHLORIDE 0.9 % IV SOLN
Freq: Once | INTRAVENOUS | Status: AC
Start: 2012-12-17 — End: 2012-12-17
  Administered 2012-12-17: 12:00:00 via INTRAVENOUS

## 2012-12-17 NOTE — Progress Notes (Signed)
Pt received Boniva without complications.  Pt tolerated well.  Pt discharged to home.

## 2013-03-10 ENCOUNTER — Telehealth: Payer: Self-pay

## 2013-03-10 NOTE — Telephone Encounter (Signed)
Spoke to Katrina in the prior authorization dept. At [facility]. Phone # [phone].  Lotronex 1mg  has been approved thru 09/10/2013, dx: 564.1 IBS .  Contacted [facility] [place] at [phone] and told them it has been approved.

## 2013-03-18 ENCOUNTER — Encounter (HOSPITAL_COMMUNITY)
Admission: RE | Admit: 2013-03-18 | Discharge: 2013-03-18 | Disposition: A | Discharge status: Home / Self Care | Payer: Medicare Other | Source: Ambulatory Visit | Attending: Family Medicine | Admitting: Family Medicine

## 2013-03-18 ENCOUNTER — Encounter (HOSPITAL_COMMUNITY): Payer: Self-pay

## 2013-03-18 ENCOUNTER — Other Ambulatory Visit (HOSPITAL_COMMUNITY): Payer: Self-pay | Admitting: Family Medicine

## 2013-03-18 DIAGNOSIS — M810 Age-related osteoporosis without current pathological fracture: Secondary | ICD-10-CM | POA: Insufficient documentation

## 2013-03-18 MED ORDER — IBANDRONATE SODIUM 3 MG/3ML IV SOLN
3.0000 mg | Freq: Once | INTRAVENOUS | Status: AC
  Administered 2013-03-18: 3 mg via INTRAVENOUS
  Filled 2013-03-18: qty 3

## 2013-03-18 MED ORDER — SODIUM CHLORIDE 0.9 % IV SOLN
INTRAVENOUS | Status: DC
  Administered 2013-03-18: 20 mL/h via INTRAVENOUS

## 2013-06-10 ENCOUNTER — Encounter (HOSPITAL_COMMUNITY): Payer: Self-pay

## 2013-06-10 ENCOUNTER — Other Ambulatory Visit (HOSPITAL_COMMUNITY): Payer: Self-pay | Admitting: Family Medicine

## 2013-06-10 ENCOUNTER — Encounter (HOSPITAL_COMMUNITY)
Admission: RE | Admit: 2013-06-10 | Discharge: 2013-06-10 | Disposition: A | Discharge status: Home / Self Care | Payer: Medicare Other | Source: Ambulatory Visit | Attending: Family Medicine | Admitting: Family Medicine

## 2013-06-10 DIAGNOSIS — N828 Other female genital tract fistulae: Secondary | ICD-10-CM

## 2013-06-10 DIAGNOSIS — M810 Age-related osteoporosis without current pathological fracture: Secondary | ICD-10-CM | POA: Insufficient documentation

## 2013-06-10 HISTORY — DX: Other female genital tract fistulae: N82.8

## 2013-06-10 MED ORDER — SODIUM CHLORIDE 0.9 % IV SOLN
INTRAVENOUS | Status: AC
  Administered 2013-06-10: 12:00:00 via INTRAVENOUS

## 2013-06-10 MED ORDER — IBANDRONATE SODIUM 3 MG/3ML IV SOLN
3.0000 mg | Freq: Once | INTRAVENOUS | Status: AC
  Administered 2013-06-10: 3 mg via INTRAVENOUS
  Filled 2013-06-10: qty 3

## 2013-07-24 ENCOUNTER — Other Ambulatory Visit: Payer: Self-pay | Admitting: Internal Medicine

## 2013-07-28 ENCOUNTER — Inpatient Hospital Stay (HOSPITAL_COMMUNITY): Payer: Medicare Other

## 2013-07-28 ENCOUNTER — Encounter (HOSPITAL_COMMUNITY): Payer: Self-pay | Admitting: Emergency Medicine

## 2013-07-28 ENCOUNTER — Encounter (HOSPITAL_COMMUNITY): Admission: AD | Disposition: A | Discharge status: Skilled Nursing Facility | Payer: Self-pay | Source: Home / Self Care

## 2013-07-28 ENCOUNTER — Emergency Department (HOSPITAL_COMMUNITY): Payer: Medicare Other

## 2013-07-28 ENCOUNTER — Encounter (HOSPITAL_COMMUNITY): Payer: Self-pay | Admitting: Pharmacy Technician

## 2013-07-28 ENCOUNTER — Inpatient Hospital Stay (HOSPITAL_COMMUNITY)
Admission: AD | Admit: 2013-07-28 | Discharge: 2013-08-15 | DRG: 326 | Disposition: A | Discharge status: Skilled Nursing Facility | Payer: Medicare Other | Attending: General Surgery | Admitting: General Surgery

## 2013-07-28 DIAGNOSIS — D62 Acute posthemorrhagic anemia: Secondary | ICD-10-CM | POA: Diagnosis not present

## 2013-07-28 DIAGNOSIS — G8918 Other acute postprocedural pain: Secondary | ICD-10-CM | POA: Diagnosis not present

## 2013-07-28 DIAGNOSIS — IMO0002 Reserved for concepts with insufficient information to code with codable children: Secondary | ICD-10-CM

## 2013-07-28 DIAGNOSIS — R1115 Cyclical vomiting syndrome unrelated to migraine: Secondary | ICD-10-CM

## 2013-07-28 DIAGNOSIS — K920 Hematemesis: Secondary | ICD-10-CM | POA: Insufficient documentation

## 2013-07-28 DIAGNOSIS — T182XXD Foreign body in stomach, subsequent encounter: Secondary | ICD-10-CM

## 2013-07-28 DIAGNOSIS — D638 Anemia in other chronic diseases classified elsewhere: Secondary | ICD-10-CM | POA: Diagnosis present

## 2013-07-28 DIAGNOSIS — E861 Hypovolemia: Secondary | ICD-10-CM | POA: Diagnosis not present

## 2013-07-28 DIAGNOSIS — K589 Irritable bowel syndrome without diarrhea: Secondary | ICD-10-CM

## 2013-07-28 DIAGNOSIS — M810 Age-related osteoporosis without current pathological fracture: Secondary | ICD-10-CM | POA: Diagnosis present

## 2013-07-28 DIAGNOSIS — K223 Perforation of esophagus: Secondary | ICD-10-CM

## 2013-07-28 DIAGNOSIS — H04129 Dry eye syndrome of unspecified lacrimal gland: Secondary | ICD-10-CM | POA: Diagnosis present

## 2013-07-28 DIAGNOSIS — E876 Hypokalemia: Secondary | ICD-10-CM

## 2013-07-28 DIAGNOSIS — S31109A Unspecified open wound of abdominal wall, unspecified quadrant without penetration into peritoneal cavity, initial encounter: Secondary | ICD-10-CM

## 2013-07-28 DIAGNOSIS — E46 Unspecified protein-calorie malnutrition: Secondary | ICD-10-CM

## 2013-07-28 DIAGNOSIS — K922 Gastrointestinal hemorrhage, unspecified: Secondary | ICD-10-CM

## 2013-07-28 DIAGNOSIS — R7309 Other abnormal glucose: Secondary | ICD-10-CM | POA: Diagnosis not present

## 2013-07-28 DIAGNOSIS — R112 Nausea with vomiting, unspecified: Secondary | ICD-10-CM

## 2013-07-28 DIAGNOSIS — L02219 Cutaneous abscess of trunk, unspecified: Secondary | ICD-10-CM | POA: Diagnosis not present

## 2013-07-28 DIAGNOSIS — I495 Sick sinus syndrome: Secondary | ICD-10-CM | POA: Diagnosis present

## 2013-07-28 DIAGNOSIS — J95821 Acute postprocedural respiratory failure: Secondary | ICD-10-CM | POA: Diagnosis not present

## 2013-07-28 DIAGNOSIS — K219 Gastro-esophageal reflux disease without esophagitis: Secondary | ICD-10-CM

## 2013-07-28 DIAGNOSIS — K449 Diaphragmatic hernia without obstruction or gangrene: Secondary | ICD-10-CM

## 2013-07-28 DIAGNOSIS — W44F1XD Bezoar entering into or through a natural orifice, subsequent encounter: Secondary | ICD-10-CM

## 2013-07-28 DIAGNOSIS — J9601 Acute respiratory failure with hypoxia: Secondary | ICD-10-CM

## 2013-07-28 DIAGNOSIS — Z9849 Cataract extraction status, unspecified eye: Secondary | ICD-10-CM

## 2013-07-28 DIAGNOSIS — I10 Essential (primary) hypertension: Secondary | ICD-10-CM | POA: Diagnosis present

## 2013-07-28 DIAGNOSIS — H409 Unspecified glaucoma: Secondary | ICD-10-CM | POA: Diagnosis present

## 2013-07-28 DIAGNOSIS — K861 Other chronic pancreatitis: Secondary | ICD-10-CM

## 2013-07-28 DIAGNOSIS — K562 Volvulus: Secondary | ICD-10-CM

## 2013-07-28 DIAGNOSIS — G9340 Encephalopathy, unspecified: Secondary | ICD-10-CM | POA: Diagnosis not present

## 2013-07-28 DIAGNOSIS — R34 Anuria and oliguria: Secondary | ICD-10-CM | POA: Diagnosis not present

## 2013-07-28 DIAGNOSIS — R197 Diarrhea, unspecified: Secondary | ICD-10-CM

## 2013-07-28 DIAGNOSIS — I959 Hypotension, unspecified: Secondary | ICD-10-CM | POA: Diagnosis not present

## 2013-07-28 DIAGNOSIS — M4120 Other idiopathic scoliosis, site unspecified: Secondary | ICD-10-CM | POA: Diagnosis present

## 2013-07-28 DIAGNOSIS — R7989 Other specified abnormal findings of blood chemistry: Secondary | ICD-10-CM | POA: Diagnosis present

## 2013-07-28 DIAGNOSIS — M4130 Thoracogenic scoliosis, site unspecified: Secondary | ICD-10-CM | POA: Diagnosis present

## 2013-07-28 DIAGNOSIS — T182XXA Foreign body in stomach, initial encounter: Secondary | ICD-10-CM | POA: Diagnosis present

## 2013-07-28 DIAGNOSIS — R000 Tachycardia, unspecified: Secondary | ICD-10-CM | POA: Insufficient documentation

## 2013-07-28 DIAGNOSIS — Z8249 Family history of ischemic heart disease and other diseases of the circulatory system: Secondary | ICD-10-CM

## 2013-07-28 DIAGNOSIS — J90 Pleural effusion, not elsewhere classified: Secondary | ICD-10-CM | POA: Diagnosis not present

## 2013-07-28 DIAGNOSIS — Z801 Family history of malignant neoplasm of trachea, bronchus and lung: Secondary | ICD-10-CM

## 2013-07-28 DIAGNOSIS — K319 Disease of stomach and duodenum, unspecified: Secondary | ICD-10-CM | POA: Diagnosis present

## 2013-07-28 DIAGNOSIS — Z9889 Other specified postprocedural states: Secondary | ICD-10-CM

## 2013-07-28 DIAGNOSIS — Z800 Family history of malignant neoplasm of digestive organs: Secondary | ICD-10-CM

## 2013-07-28 DIAGNOSIS — Z808 Family history of malignant neoplasm of other organs or systems: Secondary | ICD-10-CM

## 2013-07-28 DIAGNOSIS — E43 Unspecified severe protein-calorie malnutrition: Secondary | ICD-10-CM | POA: Diagnosis present

## 2013-07-28 DIAGNOSIS — Z823 Family history of stroke: Secondary | ICD-10-CM

## 2013-07-28 DIAGNOSIS — Z87891 Personal history of nicotine dependence: Secondary | ICD-10-CM

## 2013-07-28 DIAGNOSIS — J9600 Acute respiratory failure, unspecified whether with hypoxia or hypercapnia: Secondary | ICD-10-CM

## 2013-07-28 DIAGNOSIS — R001 Bradycardia, unspecified: Secondary | ICD-10-CM

## 2013-07-28 DIAGNOSIS — K315 Obstruction of duodenum: Secondary | ICD-10-CM | POA: Diagnosis present

## 2013-07-28 DIAGNOSIS — R42 Dizziness and giddiness: Secondary | ICD-10-CM | POA: Diagnosis present

## 2013-07-28 DIAGNOSIS — K311 Adult hypertrophic pyloric stenosis: Secondary | ICD-10-CM

## 2013-07-28 DIAGNOSIS — Z7982 Long term (current) use of aspirin: Secondary | ICD-10-CM

## 2013-07-28 DIAGNOSIS — K5730 Diverticulosis of large intestine without perforation or abscess without bleeding: Secondary | ICD-10-CM | POA: Diagnosis present

## 2013-07-28 HISTORY — DX: Acute respiratory failure with hypoxia: J96.01

## 2013-07-28 HISTORY — PX: ESOPHAGOGASTRODUODENOSCOPY: SHX5428

## 2013-07-28 LAB — COMPREHENSIVE METABOLIC PANEL
ALT: 18 U/L (ref 0–35)
Alkaline Phosphatase: 99 U/L (ref 39–117)
CO2: 30 mEq/L (ref 19–32)
Chloride: 98 mEq/L (ref 96–112)
GFR calc Af Amer: >90 mL/min (ref >90)
GFR calc non Af Amer: 84 mL/min — ABNORMAL LOW (ref >90)
Glucose, Bld: 181 mg/dL — ABNORMAL HIGH (ref 70–99)
Potassium: 4 mEq/L (ref 3.5–5.1)
Sodium: 143 mEq/L (ref 135–145)
Total Bilirubin: 0.9 mg/dL (ref 0.3–1.2)

## 2013-07-28 LAB — CBC WITH DIFFERENTIAL/PLATELET
Basophils Absolute: 0 K/uL (ref 0.0–0.1)
Basophils Relative: 0 % (ref 0–1)
Eosinophils Absolute: 0 K/uL (ref 0.0–0.7)
Eosinophils Relative: 0 % (ref 0–5)
HCT: 41.2 % (ref 36.0–46.0)
Hemoglobin: 14.1 g/dL (ref 12.0–15.0)
Lymphocytes Relative: 7 % — ABNORMAL LOW (ref 12–46)
Lymphs Abs: 0.8 10*3/uL (ref 0.7–4.0)
MCH: 30.5 pg (ref 26.0–34.0)
MCHC: 34.2 g/dL (ref 30.0–36.0)
MCV: 89 fL (ref 78.0–100.0)
Monocytes Absolute: 0.8 K/uL (ref 0.1–1.0)
Monocytes Relative: 7 % (ref 3–12)
Neutro Abs: 9.3 K/uL — ABNORMAL HIGH (ref 1.7–7.7)
Neutrophils Relative %: 86 % — ABNORMAL HIGH (ref 43–77)
Platelets: 312 10*3/uL (ref 150–400)
RBC: 4.63 MIL/uL (ref 3.87–5.11)
RDW: 12.6 % (ref 11.5–15.5)
WBC: 10.8 10*3/uL — ABNORMAL HIGH (ref 4.0–10.5)

## 2013-07-28 LAB — COMPREHENSIVE METABOLIC PANEL WITH GFR
AST: 32 U/L (ref 0–37)
Albumin: 4.2 g/dL (ref 3.5–5.2)
BUN: 30 mg/dL — ABNORMAL HIGH (ref 6–23)
Calcium: 10.2 mg/dL (ref 8.4–10.5)
Creatinine, Ser: 0.71 mg/dL (ref 0.50–1.10)
Total Protein: 7.9 g/dL (ref 6.0–8.3)

## 2013-07-28 LAB — PROTIME-INR
INR: 1.14 (ref 0.00–1.49)
Prothrombin Time: 14.4 s (ref 11.6–15.2)

## 2013-07-28 LAB — CG4 I-STAT (LACTIC ACID): Lactic Acid, Venous: 2.2 mmol/L (ref 0.5–2.2)

## 2013-07-28 LAB — APTT: aPTT: 27 s (ref 24–37)

## 2013-07-28 LAB — POCT I-STAT TROPONIN I: Troponin i, poc: 0.01 ng/mL (ref 0.00–0.08)

## 2013-07-28 LAB — OCCULT BLOOD GASTRIC / DUODENUM (SPECIMEN CUP)
Occult Blood, Gastric: POSITIVE — AB
pH, Gastric: UNDETERMINED

## 2013-07-28 LAB — MRSA PCR SCREENING: MRSA by PCR: NEGATIVE

## 2013-07-28 LAB — LIPASE, BLOOD: Lipase: 16 U/L (ref 11–59)

## 2013-07-28 SURGERY — EGD (ESOPHAGOGASTRODUODENOSCOPY)
Anesthesia: Moderate Sedation

## 2013-07-28 MED ORDER — SODIUM CHLORIDE 0.9 % IV BOLUS (SEPSIS)
500.0000 mL | Freq: Once | INTRAVENOUS | Status: AC
  Administered 2013-07-28: 500 mL via INTRAVENOUS

## 2013-07-28 MED ORDER — TIMOLOL MALEATE 0.5 % OP SOLN
1.0000 [drp] | Freq: Two times a day (BID) | OPHTHALMIC | Status: DC
  Administered 2013-07-28 – 2013-08-09 (×26): 1 [drp] via OPHTHALMIC

## 2013-07-28 MED ORDER — TAFLUPROST 0.0015 % OP SOLN
1.0000 [drp] | Freq: Every day | OPHTHALMIC | Status: DC
  Administered 2013-07-28 – 2013-08-14 (×18): 1 [drp] via OPHTHALMIC
  Filled 2013-07-28: qty 1

## 2013-07-28 MED ORDER — FENTANYL CITRATE 0.05 MG/ML IJ SOLN
50.0000 ug | Freq: Once | INTRAMUSCULAR | Status: AC
  Administered 2013-07-28: 50 ug via INTRAVENOUS
  Filled 2013-07-28 (×2): qty 2

## 2013-07-28 MED ORDER — SODIUM CHLORIDE 0.9 % IV SOLN: INTRAVENOUS | Status: DC

## 2013-07-28 MED ORDER — BUTAMBEN-TETRACAINE-BENZOCAINE 2-2-14 % EX AERO
INHALATION_SPRAY | CUTANEOUS | Status: DC | PRN
  Administered 2013-07-28: 2 via TOPICAL

## 2013-07-28 MED ORDER — ONDANSETRON HCL 4 MG/2ML IJ SOLN
4.0000 mg | Freq: Once | INTRAMUSCULAR | Status: AC
  Administered 2013-07-28: 4 mg via INTRAVENOUS
  Filled 2013-07-28: qty 2

## 2013-07-28 MED ORDER — MIDAZOLAM HCL 2 MG/2ML IJ SOLN
2.0000 mg | INTRAMUSCULAR | Status: DC | PRN
  Administered 2013-07-28 – 2013-07-30 (×6): 2 mg via INTRAVENOUS
  Filled 2013-07-28 (×2): qty 2
  Filled 2013-07-28: qty 4
  Filled 2013-07-28: qty 2

## 2013-07-28 MED ORDER — FENTANYL CITRATE 0.05 MG/ML IJ SOLN
INTRAMUSCULAR | Status: AC
  Filled 2013-07-28: qty 2

## 2013-07-28 MED ORDER — FENTANYL CITRATE 0.05 MG/ML IJ SOLN
25.0000 ug | INTRAMUSCULAR | Status: DC | PRN
  Administered 2013-07-28 – 2013-07-30 (×7): 50 ug via INTRAVENOUS
  Administered 2013-07-30: 25 ug via INTRAVENOUS
  Administered 2013-07-31 – 2013-08-01 (×8): 50 ug via INTRAVENOUS
  Administered 2013-08-01 (×3): 25 ug via INTRAVENOUS
  Administered 2013-08-01 – 2013-08-02 (×6): 50 ug via INTRAVENOUS
  Administered 2013-08-03: 25 ug via INTRAVENOUS
  Filled 2013-07-28 (×25): qty 2

## 2013-07-28 MED ORDER — SODIUM CHLORIDE 0.9 % IV SOLN
INTRAVENOUS | Status: DC
  Administered 2013-07-28 – 2013-07-31 (×4): via INTRAVENOUS
  Administered 2013-08-02: 50 mL/h via INTRAVENOUS

## 2013-07-28 MED ORDER — SODIUM CHLORIDE 0.9 % IV SOLN
8.0000 mg/h | INTRAVENOUS | Status: DC
  Administered 2013-07-28 (×2): 8 mg/h via INTRAVENOUS
  Filled 2013-07-28 (×2): qty 80

## 2013-07-28 MED ORDER — SODIUM CHLORIDE 0.9 % IV SOLN: 8.0000 mg/h | INTRAVENOUS | Status: DC

## 2013-07-28 MED ORDER — SODIUM CHLORIDE 0.9 % IV SOLN
80.0000 mg | Freq: Once | INTRAVENOUS | Status: AC
  Administered 2013-07-28: 80 mg via INTRAVENOUS
  Filled 2013-07-28: qty 80

## 2013-07-28 MED ORDER — MIDAZOLAM HCL 10 MG/2ML IJ SOLN
INTRAMUSCULAR | Status: AC
  Filled 2013-07-28: qty 2

## 2013-07-28 MED ORDER — LATANOPROST 0.005 % OP SOLN
1.0000 [drp] | Freq: Every day | OPHTHALMIC | Status: DC
  Filled 2013-07-28: qty 2.5

## 2013-07-28 MED ORDER — SODIUM CHLORIDE 0.9 % IV SOLN: Freq: Once | INTRAVENOUS | Status: AC

## 2013-07-28 MED ORDER — CARBOXYMETHYLCELLULOSE SODIUM 0.5 % OP SOLN
1.0000 [drp] | Freq: Three times a day (TID) | OPHTHALMIC | Status: DC
  Administered 2013-07-28 – 2013-08-15 (×60): 1 [drp] via OPHTHALMIC

## 2013-07-28 MED ORDER — ACETAMINOPHEN 650 MG RE SUPP
650.0000 mg | Freq: Four times a day (QID) | RECTAL | Status: DC | PRN
  Administered 2013-07-31: 650 mg via RECTAL
  Filled 2013-07-28: qty 1

## 2013-07-28 MED ORDER — CVS EYE LUBRICANT NIGHTTIME OP OINT
1.0000 [drp] | TOPICAL_OINTMENT | Freq: Every day | OPHTHALMIC | Status: DC
  Administered 2013-07-28 – 2013-08-14 (×18): 1 [drp] via OPHTHALMIC
  Filled 2013-07-28: qty 1

## 2013-07-28 MED ORDER — POLYVINYL ALCOHOL 1.4 % OP SOLN
1.0000 [drp] | Freq: Four times a day (QID) | OPHTHALMIC | Status: DC
Start: 2013-07-28 — End: 2013-07-28
  Filled 2013-07-28: qty 15

## 2013-07-28 MED ORDER — MIDAZOLAM HCL 2 MG/2ML IJ SOLN
INTRAMUSCULAR | Status: AC
  Filled 2013-07-28: qty 4

## 2013-07-28 MED ORDER — TIMOLOL HEMIHYDRATE 0.5 % OP SOLN: 1.0000 [drp] | Freq: Two times a day (BID) | OPHTHALMIC | Status: DC

## 2013-07-28 MED ORDER — MIDAZOLAM HCL 10 MG/2ML IJ SOLN
INTRAMUSCULAR | Status: DC | PRN
  Administered 2013-07-28 (×3): 1 mg via INTRAVENOUS
  Administered 2013-07-28: 2 mg via INTRAVENOUS

## 2013-07-28 MED ORDER — TIMOLOL MALEATE 0.5 % OP SOLG: 1.0000 [drp] | Freq: Every day | OPHTHALMIC | Status: DC

## 2013-07-28 MED ORDER — REFRESH LACRI-LUBE OP OINT: 1.0000 [drp] | TOPICAL_OINTMENT | Freq: Four times a day (QID) | OPHTHALMIC | Status: DC

## 2013-07-28 MED ORDER — CHLORHEXIDINE GLUCONATE 0.12 % MT SOLN
15.0000 mL | Freq: Two times a day (BID) | OROMUCOSAL | Status: DC
  Administered 2013-07-28 – 2013-08-04 (×14): 15 mL via OROMUCOSAL
  Filled 2013-07-28 (×16): qty 15

## 2013-07-28 MED ORDER — ONDANSETRON HCL 4 MG PO TABS: 4.0000 mg | ORAL_TABLET | ORAL | Status: DC | PRN

## 2013-07-28 MED ORDER — METOCLOPRAMIDE HCL 5 MG/ML IJ SOLN
5.0000 mg | Freq: Once | INTRAMUSCULAR | Status: AC
  Administered 2013-07-28: 5 mg via INTRAVENOUS
  Filled 2013-07-28: qty 2

## 2013-07-28 MED ORDER — TAFLUPROST 0.0015 % OP SOLN: 1.0000 [drp] | Freq: Every day | OPHTHALMIC | Status: DC

## 2013-07-28 MED ORDER — FENTANYL CITRATE 0.05 MG/ML IJ SOLN
INTRAMUSCULAR | Status: AC
  Filled 2013-07-28: qty 4

## 2013-07-28 MED ORDER — ONDANSETRON HCL 4 MG/2ML IJ SOLN
4.0000 mg | Freq: Four times a day (QID) | INTRAMUSCULAR | Status: DC | PRN
  Administered 2013-07-28 – 2013-08-13 (×2): 4 mg via INTRAVENOUS
  Filled 2013-07-28 (×2): qty 2

## 2013-07-28 MED ORDER — HALOPERIDOL LACTATE 5 MG/ML IJ SOLN
5.0000 mg | Freq: Four times a day (QID) | INTRAMUSCULAR | Status: DC
  Administered 2013-07-28: 5 mg via INTRAVENOUS
  Filled 2013-07-28 (×14): qty 1

## 2013-07-28 MED ORDER — KCL IN DEXTROSE-NACL 20-5-0.45 MEQ/L-%-% IV SOLN
INTRAVENOUS | Status: DC
  Administered 2013-07-28 – 2013-07-30 (×4): via INTRAVENOUS
  Filled 2013-07-28 (×5): qty 1000

## 2013-07-28 MED ORDER — DEXMEDETOMIDINE HCL IN NACL 200 MCG/50ML IV SOLN
0.0000 ug/kg/h | INTRAVENOUS | Status: DC
  Administered 2013-07-28: 0.2 ug/kg/h via INTRAVENOUS
  Administered 2013-07-29: 0.4 ug/kg/h via INTRAVENOUS
  Administered 2013-07-29 (×2): 0.6 ug/kg/h via INTRAVENOUS
  Administered 2013-07-30: 0.5 ug/kg/h via INTRAVENOUS
  Filled 2013-07-28 (×4): qty 50

## 2013-07-28 MED ORDER — ACETAMINOPHEN 325 MG PO TABS: 650.0000 mg | ORAL_TABLET | Freq: Four times a day (QID) | ORAL | Status: DC | PRN

## 2013-07-28 MED ORDER — BIOTENE DRY MOUTH MT LIQD
15.0000 mL | Freq: Four times a day (QID) | OROMUCOSAL | Status: DC
  Administered 2013-07-29 – 2013-08-14 (×58): 15 mL via OROMUCOSAL

## 2013-07-28 MED ORDER — PANTOPRAZOLE SODIUM 40 MG IV SOLR
40.0000 mg | INTRAVENOUS | Status: DC
  Administered 2013-07-29 – 2013-08-03 (×6): 40 mg via INTRAVENOUS
  Filled 2013-07-28 (×8): qty 40

## 2013-07-28 MED ORDER — DIPHENHYDRAMINE HCL 50 MG/ML IJ SOLN
INTRAMUSCULAR | Status: AC
  Filled 2013-07-28: qty 1

## 2013-07-28 MED ORDER — FENTANYL CITRATE 0.05 MG/ML IJ SOLN
INTRAMUSCULAR | Status: DC | PRN
  Administered 2013-07-28: 25 ug via INTRAVENOUS

## 2013-07-28 MED ORDER — TIMOLOL MALEATE 0.5 % OP SOLN
1.0000 [drp] | Freq: Two times a day (BID) | OPHTHALMIC | Status: DC
Start: 2013-07-28 — End: 2013-07-28
  Filled 2013-07-28: qty 5

## 2013-07-28 NOTE — Progress Notes (Signed)
   Breaktrhough agitation despite haldol RASS +3   Plan Start precedex gtt   Dr. Kalman Shan, M.D., [facility].C.P Pulmonary and Critical Care Medicine Staff Physician [facility] Pager: [phone], If no answer or between  15:00h - 7:00h: call [phone]  07/28/2013 10:08 PM

## 2013-07-28 NOTE — Progress Notes (Signed)
Intermittent agitated delirium. RN gave some benzo and now on camera care patient calm  Plan Haldol prn  Dr. Kalman Shan, M.D., [facility].C.P Pulmonary and Critical Care Medicine Staff Physician [facility] Pager: [phone], If no answer or between  15:00h - 7:00h: call [phone]  07/28/2013 9:29 PM

## 2013-07-28 NOTE — Procedures (Signed)
Oral Intubation Procedure Note   Procedure: Intubation  Indications: Intractable vomiting, Airway protection  Consent: Unable to obtain consent because of altered level of consciousness.  Time Out: Verified patient identification, verified procedure, site/side was marked, verified correct patient position, special equipment/implants available, medications/allergies/relevent history reviewed, required imaging and test results available.   Pre-meds: Etomidate 10 mg IV  Neuromuscular blockade: Rocuronium 25 mg IV  Laryngoscope: #3 MAC  Visualization: cords fully visualized  ETT: 87.5 ETT passed on first attempt and secured @ 22 cm at upper incisors  Findings: extensive feculent staining of oropharynx and cords   Evaluation:  CXR pending  Pt tolerated procedure well without complications   Billy Fischer, MD ; [facility] service Mobile [phone].  After 5:30 PM or weekends, call [phone]

## 2013-07-28 NOTE — Progress Notes (Signed)
I went back to evaluate the patient after a call from J. Zehr, PA-C reporting that pt continued to vomit.  When I saw the patient, she was alert, and not actively vomiting and appeared comfortable.  I ordered additional zofran and reglan x1 one in ED which were given at 1136 and 1142, respectively.  Patient states that with the above meds, her vomiting seems to have calmed down.  She is awake and alert and able to maintain her airway.  She is in no distress.  She denies any cp, sob.  Will continue to monitor pt and address issues as they arise.  DTat [phone]

## 2013-07-28 NOTE — Interval H&P Note (Signed)
History and Physical Interval Note:  07/28/2013 2:49 PM  [name]  has presented today for surgery, with the diagnosis of UGIB; hematemesis  The various methods of treatment have been discussed with the patient and family. After consideration of risks, benefits and other options for treatment, the patient has consented to  Procedure(s): ESOPHAGOGASTRODUODENOSCOPY (EGD) (N/A) as a surgical intervention .  The patient's history has been reviewed, patient examined, no change in status, stable for surgery.  I have reviewed the patient's chart and labs.  Questions were answered to the patient's satisfaction.     Rachael Fee

## 2013-07-28 NOTE — Op Note (Signed)
[facility] [address] [place], [address]   ENDOSCOPY PROCEDURE REPORT  PATIENT: [name], [name]  MR#: [id number] BIRTHDATE: 08-29-1941 , 72  yrs. old GENDER: Female ENDOSCOPIST: Rachael Fee, MD PROCEDURE DATE:  07/28/2013 PROCEDURE:  EGD, diagnostic ASA CLASS:     Class III INDICATIONS:  vomiting, hematemesis, known large HH. MEDICATIONS: Fentanyl 25 mcg IV and Versed 2 mg IV TOPICAL ANESTHETIC: none  DESCRIPTION OF PROCEDURE: After the risks benefits and alternatives of the procedure were thoroughly explained, informed consent was obtained.  The Pentax [id number] 3.8 [id number] endoscope was introduced through the mouth and advanced to the stomach body. Without limitations.  The instrument was slowly withdrawn as the mucosa was fully examined.     There was feculent liquid material in esophagus, oropharynx.  The gastric anatomy was VERY unusual, I believe I advanced into the proximal stomach only, the lumen was filled with feculent material, mucosa appeared edematous.  Retroflexion was not performed.     The scope was then withdrawn from the patient and the procedure completed.  COMPLICATIONS: There were no complications. ENDOSCOPIC IMPRESSION: There was feculent liquid material in esophagus, oropharynx.  The gastric anatomy was very unusual, I believe I advanced into the proximal stomach only. The lumen was filled with feculent material, my limited views of the gastric mucosa appeared edematous.  RECOMMENDATIONS: I am having her intubated for airway protection.  I discussed the case with radiology who reviewed todays abd films.  I am concerned about gastric volvulus.  I spoke with general surgery team and have ordered CT scan to help sort out this issue.   eSigned:  Rachael Fee, MD 07/28/2013 3:43 PM   CC: Stan Head, MD

## 2013-07-28 NOTE — Consult Note (Signed)
Referring Provider: No ref. provider found Primary Care Physician:  GATES,DONNA RUTH, MD Primary Gastroenterologist:  Dr. Gessner  Reason for Consultation:  Vomiting; hematemesis  HPI: [name] is a 72 y.o. female with a history of large hiatal hernia s/p Nissen fundoplication in the past, diverticulosis, GERD, IBS, glaucoma, and hypertension.  She presents to [facility] ED with one-day history of intractable vomiting.  She had too numerous to count episodes of vomitus yesterday. She noted some coffee grounds as well as a small amount of bright red blood. She called her Dr. Jacobs on-call last evening who suggested that the patient go to the emergency department, which she did this morning. She denies any recent use of NSAIDS or other new meds. She denies fevers, chills, chest pain, shortness of breath, diarrhea, and dark or bloody stools. She does complain of some dizziness since her vomiting started.  Says that recently it felt like she was having more issues with reflux and then she would sometimes vomit, which contained the food she had eaten.  In the ED, the patient continues to have a moderate amount of emesis with coffee grounds material during my examination.  No frank blood seen.  The patient's blood pressure is stable. Initially, the patient had a heart rate 127 which improved to 110s after one liter NS. Hemoglobin was 14.1, WBC 10.8. BMP and hepatic enzymes were unremarkable. Lipase was 16. Chest x-ray was the only imaging performed, but did not show any infiltrates or edema.  She has received one dose of zofran and a dose of reglan as well.  INR was 1.14, PTT 27, lactic acid 2.20.  Has been placed on PPI gtt.    Past Medical History  Diagnosis Date  . Glaucoma   . Dry eye syndrome   . GI bleed   . Diverticulosis   . GERD (gastroesophageal reflux disease)   . Thoracogenic scoliosis of thoracolumbar region     SEVERE  . Chronic pancreatitis   . IBS (irritable bowel syndrome)   . Pelvic  floor dysfunction     and rectalprolaspe  . Osteoporosis   . Internal hemorrhoids   . Hypertension   . Dry mouth   . Blood transfusion     HEMORRHAGED  1 WEEK AFTER COLONOSCOPY  -REQUIRED TRANSFUSION  . Abscessed tooth     STATES TOOTH PULLED RIGHT LOWER MOLAR   AND LEFT LOWER MOLAR ON3/25/13 AND PT ON ANTIBIOTIC  . Hiatal hernia     LARGE HIATAL HERNIA PER CXR REPORT  . 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 Nissen fundoplication    . Vaginal hysterectomy  2002    A&P repair/bladder sling  . Cataract extraction      bilateral  . Band hemorrhoidectomy    . Mouth surgery    . Colonoscopy  02/03/2008    diverticulosis, internal hemorrhoids  . Upper gastrointestinal endoscopy  02/12/2008    hiatal henia  . Flexible sigmoidoscopy  11/11/2008    internal hemorrhoids  . Hiatal hernia repair    . Mouth surgery    . Eye surgery      LASER OF BOTH EYES  FOR ELEVATED PRESSURE  . Bladder suspension  2002    Grapey - w/ hysty  . Staple hemorrhoidectomy      Prior to Admission medications   Medication Sig   Start Date End Date Taking? Authorizing Provider  alosetron (LOTRONEX) 1 MG tablet TAKE 1 TABLET BY MOUTH TWICE A DAY AS NEEDED 12/11/12   Carl E Gessner, MD  amLODipine-benazepril (LOTREL) 5-20 MG per capsule Take 1 capsule by mouth daily after breakfast. Patient took at home prior to arrival to Short Stay    Historical Provider, MD  amoxicillin (AMOXIL) 875 MG tablet Take 875 mg by mouth 2 (two) times daily.    Historical Provider, MD  Artificial Tear Ointment (REFRESH LACRI-LUBE) OINT Apply 1 drop to eye.    Historical Provider, MD  aspirin 81 MG tablet Take 81 mg by mouth daily after breakfast.     Historical Provider, MD  brimonidine (ALPHAGAN) 0.2 % ophthalmic solution Place 1 drop into both eyes 2 (two) times daily.    Historical Provider,  MD  calcium carbonate (TUMS - DOSED IN MG ELEMENTAL CALCIUM) 500 MG chewable tablet Chew 1 tablet by mouth as needed. Heart burn     Historical Provider, MD  CREON 24000 UNITS CPEP TAKE 3 CAPSULES BY MOUTH WITH EACH MEAL AND TAKE ONE CAPSULE WITH EACH SNACK 07/24/13   Carl E Gessner, MD  dorzolamide (TRUSOPT) 2 % ophthalmic solution Place 1 drop into both eyes 2 (two) times daily.     Historical Provider, MD  ibandronate (BONIVA) 150 MG tablet Take 150 mg by mouth every 3 (three) months. Take in the morning with a full glass of water, on an empty stomach, and do not take anything else by mouth or lie down for the next 30 min.    Historical Provider, MD  loperamide (IMODIUM) 1 MG/5ML solution Take 1 mg by mouth daily.    Historical Provider, MD  Multiple Vitamins-Minerals (CENTRUM ULTRA WOMENS) TABS Take 1 tablet by mouth daily.    Historical Provider, MD  omeprazole (PRILOSEC) 20 MG capsule Take 1 capsule (20 mg total) by mouth daily before breakfast. 10/29/12   Carl E Gessner, MD  Pancrelipase, Lip-Prot-Amyl, (CREON) 24000 UNITS CPEP TAKE 3 CAPSULES BY MOUTH WITH EACH MEAL AND TAKE ONE CAPSULE WITH EACH SNACK 10/29/12   Carl E Gessner, MD  Tafluprost (ZIOPTAN) 0.0015 % SOLN Apply 1 drop to eye daily.    Historical Provider, MD  timolol (BETIMOL) 0.5 % ophthalmic solution Place 1 drop into both eyes 2 (two) times daily.    Historical Provider, MD  timolol (TIMOPTIC-XR) 0.5 % ophthalmic gel-forming Place 1 drop into both eyes daily.    Historical Provider, MD  Vitamin D, Ergocalciferol, (DRISDOL) 50000 UNITS CAPS Take 50,000 Units by mouth every 14 (fourteen) days.     Historical Provider, MD  Wheat Dextrin (BENEFIBER) TABS Take 2 tablets by mouth 2 (two) times daily.    Historical Provider, MD    Current Facility-Administered Medications  Medication Dose Route Frequency Provider Last Rate Last Dose  . 0.9 %  sodium chloride infusion   Intravenous Once Michael Y. Ghim, MD      . pantoprazole  (PROTONIX) 80 mg in sodium chloride 0.9 % 250 mL infusion  8 mg/hr Intravenous Continuous Michael Y. Ghim, MD 25 mL/hr at 07/28/13 1010 8 mg/hr at 07/28/13 1010   Current Outpatient Prescriptions  Medication Sig Dispense Refill  . alosetron (LOTRONEX) 1 MG tablet TAKE 1 TABLET BY MOUTH TWICE A DAY AS NEEDED  60 tablet  10  . amLODipine-benazepril (LOTREL) 5-20 MG per capsule Take 1 capsule by mouth daily after breakfast. Patient took at home prior to arrival to Short Stay      .   amoxicillin (AMOXIL) 875 MG tablet Take 875 mg by mouth 2 (two) times daily.      . Artificial Tear Ointment (REFRESH LACRI-LUBE) OINT Apply 1 drop to eye.      . aspirin 81 MG tablet Take 81 mg by mouth daily after breakfast.       . brimonidine (ALPHAGAN) 0.2 % ophthalmic solution Place 1 drop into both eyes 2 (two) times daily.      . calcium carbonate (TUMS - DOSED IN MG ELEMENTAL CALCIUM) 500 MG chewable tablet Chew 1 tablet by mouth as needed. Heart burn       . CREON 24000 UNITS CPEP TAKE 3 CAPSULES BY MOUTH WITH EACH MEAL AND TAKE ONE CAPSULE WITH EACH SNACK  330 capsule  1  . dorzolamide (TRUSOPT) 2 % ophthalmic solution Place 1 drop into both eyes 2 (two) times daily.       . ibandronate (BONIVA) 150 MG tablet Take 150 mg by mouth every 3 (three) months. Take in the morning with a full glass of water, on an empty stomach, and do not take anything else by mouth or lie down for the next 30 min.      . loperamide (IMODIUM) 1 MG/5ML solution Take 1 mg by mouth daily.      . Multiple Vitamins-Minerals (CENTRUM ULTRA WOMENS) TABS Take 1 tablet by mouth daily.      . omeprazole (PRILOSEC) 20 MG capsule Take 1 capsule (20 mg total) by mouth daily before breakfast.  90 capsule  3  . Pancrelipase, Lip-Prot-Amyl, (CREON) 24000 UNITS CPEP TAKE 3 CAPSULES BY MOUTH WITH EACH MEAL AND TAKE ONE CAPSULE WITH EACH SNACK  330 capsule  10  . Tafluprost (ZIOPTAN) 0.0015 % SOLN Apply 1 drop to eye daily.      . timolol (BETIMOL) 0.5  % ophthalmic solution Place 1 drop into both eyes 2 (two) times daily.      . timolol (TIMOPTIC-XR) 0.5 % ophthalmic gel-forming Place 1 drop into both eyes daily.      . Vitamin D, Ergocalciferol, (DRISDOL) 50000 UNITS CAPS Take 50,000 Units by mouth every 14 (fourteen) days.       . Wheat Dextrin (BENEFIBER) TABS Take 2 tablets by mouth 2 (two) times daily.        Allergies as of 07/28/2013 - Review Complete 07/28/2013  Allergen Reaction Noted  . Codeine  01/20/2008    Family History  Problem Relation Age of Onset  . Heart disease Sister   . Liver cancer Sister   . Stroke Father   . Liver cancer Sister   . Brain cancer Sister   . Colon cancer Neg Hx   . Kidney disease Mother   . Lung cancer Brother     History   Social History  . Marital Status: Widowed    Spouse Name: N/A    Number of Children: N/A  . Years of Education: N/A   Occupational History  . former School bus driver    Social History Main Topics  . Smoking status: Former Smoker  . Smokeless tobacco: Never Used     Comment: ONLY SMOKED AS A TEEN  . Alcohol Use: No  . Drug Use: No  . Sexual Activity: Not on file   Other Topics Concern  . Not on file   Social History Narrative  . No narrative on file    Review of Systems: Ten point ROS is O/W negative except as mentioned in HPI.  Physical Exam: Vital   signs in last 24 hours: Temp:  [98 F (36.7 C)-98.3 F (36.8 C)] 98 F (36.7 C) (11/18 0943) Pulse Rate:  [115-127] 115 (11/18 0943) Resp:  [18] 18 (11/18 0943) BP: (115-123)/(85-88) 123/85 mmHg (11/18 0943) SpO2:  [94 %-97 %] 97 % (11/18 0943)   General:  Alert, elderly, pleasant and cooperative in NAD Head:  Normocephalic and atraumatic. Eyes:  Sclera clear, no icterus.  Conjunctiva pink. Ears:  Normal auditory acuity. Mouth:  No deformity or lesions.   Lungs:  Clear throughout to auscultation.  No wheezes, crackles, or rhonchi.  Heart:  Tachy but regular rhythm Abdomen:  Soft,  non-distended.  BS present.  Non-tender.   Rectal:  Deferred.  Msk:  Has severe scoliosis. Pulses:  Normal pulses noted. Extremities:  Without clubbing or edema. Neurologic:  Alert and oriented x4;  grossly normal neurologically. Skin:  Intact without significant lesions or rashes. Psych:  Alert and cooperative. Normal mood and affect.  Lab Results:  Recent Labs  07/28/13 0900  WBC 10.8*  HGB 14.1  HCT 41.2  PLT 312   BMET  Recent Labs  07/28/13 0900  NA 143  K 4.0  CL 98  CO2 30  GLUCOSE 181*  BUN 30*  CREATININE 0.71  CALCIUM 10.2   LFT  Recent Labs  07/28/13 0900  PROT 7.9  ALBUMIN 4.2  AST 32  ALT 18  ALKPHOS 99  BILITOT 0.9   PT/INR  Recent Labs  07/28/13 0900  LABPROT 14.4  INR 1.14   Studies/Results: Dg Chest Port 1 View  07/28/2013   CLINICAL DATA:  72-year-old female with chest pain nausea and vomiting. Initial encounter.  EXAM: PORTABLE CHEST - 1 VIEW  COMPARISON:  12/11/2011 and earlier.  FINDINGS: Portable AP semi upright view at 0914 hrs. Large hiatal hernia. Stable lung volumes. Other mediastinal contours are within normal limits. Visualized tracheal air column is within normal limits. Allowing for portable technique, the lungs are clear. No pneumothorax. No pneumoperitoneum identified. Left upper quadrant surgical clips.  IMPRESSION: No acute cardiopulmonary abnormality.  Large hiatal hernia.   Electronically Signed   By: Lee  Hall M.D.   On: 07/28/2013 09:27    IMPRESSION:  -UGIB:  Rule out ulcer disease, Camerson's lesions/ulcers from large hiatal hernia, erosive esophagitis, MWT, etc. -Large hiatal hernia  PLAN: -EGD today. -Monitor Hgb. -Continue PPI gtt for now. -Anti-emetics and IVF's.   ZEHR, JESSICA D.  07/28/2013, 10:26 AM  Pager number [phone]     ________________________________________________________________________  [facility] MD note:  I personally examined the patient, reviewed the data and agree with the  assessment and plan described above.  EGD this afternoon.   Daniel Jacobs, MD  Gastroenterology Pager [phone]  

## 2013-07-28 NOTE — ED Notes (Signed)
Pt from home with c/o of nausea, vomiting, coffee emesis started Sat. 120s NS. CBG 106.

## 2013-07-28 NOTE — H&P (Signed)
[facility] History and Physical  VIERA OKONSKI XBJ:[id number] DOB: December 17, 1940 DOA: 07/28/2013   PCP: Hollice Espy, MD   Chief Complaint: Intractable vomiting, hematemesis  HPI:  72 year old female with a history of large hiatus hernia s/p Nissen, diverticulosis, GI bleed, GERD, hypertension presents with one-day history of intractable vomiting. The patient complained of some crampy left upper abdominal pain one to 2 days ago which seems to have resolved. She blames this on her IBS. She has complained of some constipation since starting Vesicare 1 week ago, but she had 3 BM yesterday.  She denies any hematochezia, melena. She had too numerous to count episodes of vomitus yesterday. She noted some coffee grounds as well as a small amount of bright red blood. She called her gastroenterologist yesterday who suggested that the patient emergency department which she did this morning. She denies any recent use of NSAIDS or other new meds. She denies fevers, chills, chest pain, shortness of breath, headaches, diarrhea, dysuria, hematuria. She does complain of some dizziness since her vomiting started.  In the ED, the patient continues to have emesis with coffee grounds material during my examination. The patient's blood pressure is stable. Initially, the patient had a heart rate 127 which improved to 110s after one liter NS. Hemoglobin was 14.1, WBC 10.8. BMP and hepatic enzymes were unremarkable. Lipase was 16. Chest x-ray did not show any infiltrates or edema. I have requested an additional dose of zofran and one time dose of reglan. EKG shows sinus tachycardia with nonspecific ST-T wave changes. INR was 1.14. PTT 27, lactic acid 2.20. Assessment/Plan: Hematemesis/GI Bleed/Intractible vomiting -suspect possible MW tear  -GI, Dr. Christella Hartigan has been consulted -continue anti-emetics -Continue IV fluids -Monitor hemoglobin -continue protonix gtt that was started in ED -2 view abdominal x-ray -check  UA with reflex to urine culture Sinus tachycardia -Likely due to volume depletion from the patient's emesis -Continue IV fluids -Place the patient on telemetry Hypertension -hold amlodipine and benazepril for now -SBP 120s in ED with pt not taking her anti-HTN meds prior to admission Glaucoma -Continue home eyedrops Chronic pancreatitis -Resume Creon 1 the patient is able to tolerate diet      Past Medical History  Diagnosis Date  . Glaucoma   . Dry eye syndrome   . GI bleed   . Diverticulosis   . GERD (gastroesophageal reflux disease)   . Thoracogenic scoliosis of thoracolumbar region     SEVERE  . Chronic pancreatitis   . IBS (irritable bowel syndrome)   . Pelvic floor dysfunction     and rectalprolaspe  . Osteoporosis   . Internal hemorrhoids   . Hypertension   . Dry mouth   . Blood transfusion     HEMORRHAGED  1 WEEK AFTER COLONOSCOPY  -REQUIRED TRANSFUSION  . Abscessed tooth     STATES TOOTH PULLED RIGHT LOWER MOLAR   AND LEFT LOWER MOLAR ON3/25/13 AND PT ON ANTIBIOTIC  . Hiatal hernia     LARGE HIATAL HERNIA PER CXR REPORT  . 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 Nissen fundoplication    . Vaginal hysterectomy  2002    A&P repair/bladder sling  . Cataract extraction      bilateral  . Band hemorrhoidectomy    . Mouth surgery    . Colonoscopy  02/03/2008    diverticulosis, internal hemorrhoids  .  Upper gastrointestinal endoscopy  02/12/2008    hiatal henia  . Flexible sigmoidoscopy  11/11/2008    internal hemorrhoids  . Hiatal hernia repair    . Mouth surgery    . Eye surgery      LASER OF BOTH EYES  FOR ELEVATED PRESSURE  . Bladder suspension  2002    Grapey - w/ hysty  . Staple hemorrhoidectomy     Social History:  reports that she has quit smoking. Her smoking use included Cigarettes. She smoked 0.00 packs  per day. She has never used smokeless tobacco. She reports that she does not drink alcohol or use illicit drugs.   Family History  Problem Relation Age of Onset  . Heart disease Sister   . Liver cancer Sister   . Stroke Father   . Liver cancer Sister   . Brain cancer Sister   . Colon cancer Neg Hx   . Kidney disease Mother   . Lung cancer Brother      Allergies  Allergen Reactions  . Codeine     REACTION: Dlizzy      Prior to Admission medications   Medication Sig Start Date End Date Taking? Authorizing Provider  alosetron (LOTRONEX) 1 MG tablet Take 1 mg by mouth daily with breakfast. 12/11/12  Yes Iva Boop, MD  amLODipine-benazepril (LOTREL) 5-20 MG per capsule Take 1 capsule by mouth daily after breakfast. Patient took at home prior to arrival to Short Stay   Yes Historical Provider, MD  Artificial Tear Ointment (REFRESH LACRI-LUBE) OINT Apply 1 drop to eye 4 (four) times daily.    Yes Historical Provider, MD  aspirin 81 MG tablet Take 81 mg by mouth daily after breakfast.    Yes Historical Provider, MD  ibandronate (BONIVA) 150 MG tablet Take 150 mg by mouth every 3 (three) months. Take 1 tablet daily for one week then off one week   Yes Historical Provider, MD  loperamide (IMODIUM) 2 MG capsule Take 2 mg by mouth daily with breakfast.   Yes Historical Provider, MD  Multiple Vitamins-Minerals (CENTRUM ULTRA WOMENS) TABS Take 1 tablet by mouth daily.   Yes Historical Provider, MD  omeprazole (PRILOSEC) 20 MG capsule Take 20 mg by mouth daily before breakfast. 10/29/12  Yes Iva Boop, MD  Pancrelipase, Lip-Prot-Amyl, (CREON) 24000 UNITS CPEP Take 3 capsules by mouth 3 (three) times daily with meals. Take 3 caps with each meal and 3 caps with each snack   Yes Historical Provider, MD  Tafluprost (ZIOPTAN) 0.0015 % SOLN Apply 1 drop to eye at bedtime.    Yes Historical Provider, MD  timolol (BETIMOL) 0.5 % ophthalmic solution Place 1 drop into both eyes 2 (two) times daily.    Yes Historical Provider, MD  timolol (TIMOPTIC-XR) 0.5 % ophthalmic gel-forming Place 1 drop into both eyes daily.   Yes Historical Provider, MD  Vitamin D, Ergocalciferol, (DRISDOL) 50000 UNITS CAPS Take 50,000 Units by mouth every 14 (fourteen) days.    Yes Historical Provider, MD  Wheat Dextrin (BENEFIBER) TABS Take 2 tablets by mouth 2 (two) times daily.   Yes Historical Provider, MD    Review of Systems:  Constitutional:  No weight loss, night sweats, Fevers, chills, fatigue.  Head&Eyes: No headache.  No vision loss.  No eye pain or scotoma ENT:  No Difficulty swallowing,Tooth/dental problems,Sore throat,  No ear ache, post nasal drip,  Cardio-vascular:  No chest pain, Orthopnea, PND, swelling in lower extremities, palpitations  GI:  No  abdominal pain,diarrhea, hematochezia, melena Resp:  No shortness of breath with exertion or at rest. No cough. No coughing up of blood .No wheezing.No chest wall deformity  Skin:  no rash or lesions.  GU:  no dysuria, change in color of urine, no urgency or frequency. No flank pain.  Musculoskeletal:  No joint pain or swelling. No decreased range of motion. No back pain.  Psych:  No change in mood or affect. No depression or anxiety. Neurologic: No headache, no dysesthesia, no focal weakness, no vision loss. No syncope  Physical Exam: Filed Vitals:   07/28/13 0852 07/28/13 0943 07/28/13 1015 07/28/13 1045  BP: 115/88 123/85 114/88 124/78  Pulse: 127 115 112   Temp:  98 F (36.7 C)    TempSrc:  Oral    Resp:  18 21 18   SpO2: 95% 97% 99%    General:  A&O x 3, NAD, nontoxic, pleasant/cooperative Head/Eye: No conjunctival hemorrhage, no icterus, [place]/AT, No nystagmus ENT:  No icterus,  No thrush,  no pharyngeal exudate Neck:  No masses, no lymphadenpathy,  CV:  RRR, no rub, no gallop, no S3 Lung:  CTAB, good air movement, no wheeze, no rhonchi Abdomen: soft/NT, +BS, nondistended, no peritoneal signs Ext: No cyanosis, No rashes, No  petechiae, No lymphangitis, No edema; extensive scolisosis   Labs on Admission:  Basic Metabolic Panel:  Recent Labs Lab 07/28/13 0900  NA 143  K 4.0  CL 98  CO2 30  GLUCOSE 181*  BUN 30*  CREATININE 0.71  CALCIUM 10.2   Liver Function Tests:  Recent Labs Lab 07/28/13 0900  AST 32  ALT 18  ALKPHOS 99  BILITOT 0.9  PROT 7.9  ALBUMIN 4.2    Recent Labs Lab 07/28/13 0900  LIPASE 16   No results found for this basename: AMMONIA,  in the last 168 hours CBC:  Recent Labs Lab 07/28/13 0900  WBC 10.8*  NEUTROABS 9.3*  HGB 14.1  HCT 41.2  MCV 89.0  PLT 312   Cardiac Enzymes: No results found for this basename: CKTOTAL, CKMB, CKMBINDEX, TROPONINI,  in the last 168 hours BNP: No components found with this basename: POCBNP,  CBG: No results found for this basename: GLUCAP,  in the last 168 hours  Radiological Exams on Admission: Dg Chest Port 1 View  07/28/2013   CLINICAL DATA:  72 year old female with chest pain nausea and vomiting. Initial encounter.  EXAM: PORTABLE CHEST - 1 VIEW  COMPARISON:  12/11/2011 and earlier.  FINDINGS: Portable AP semi upright view at 0914 hrs. Large hiatal hernia. Stable lung volumes. Other mediastinal contours are within normal limits. Visualized tracheal air column is within normal limits. Allowing for portable technique, the lungs are clear. No pneumothorax. No pneumoperitoneum identified. Left upper quadrant surgical clips.  IMPRESSION: No acute cardiopulmonary abnormality.  Large hiatal hernia.   Electronically Signed   By: Augusto Gamble M.D.   On: 07/28/2013 09:27    EKG: Independently reviewed. Sinus tachycardia, LAFB, nonspecific ST changes.    Time spent:60 minutes Code Status:   FULL Family Communication:   Daughter at bedside   Desjuan Stearns, DO  [facility] Pager [phone]  If 7PM-7AM, please contact night-coverage [email or web address] Password [email or web address] 07/28/2013, 11:31 AM

## 2013-07-28 NOTE — Op Note (Signed)
[facility] [address] [place], [address]   ENDOSCOPY PROCEDURE REPORT  PATIENT: [name], [name]  MR#: [id number] BIRTHDATE: 06-13-1941 , 72  yrs. old GENDER: Female ENDOSCOPIST: Rachael Fee, MD PROCEDURE DATE:  07/28/2013 PROCEDURE:  EGD, endoscopic placement of OG tube ASA CLASS:     Class III INDICATIONS:  ? gastric outlet obstruction; ? bowel obstruction; unable to pass OG or NG tube. MEDICATIONS: None TOPICAL ANESTHETIC: none  DESCRIPTION OF PROCEDURE: After the risks benefits and alternatives of the procedure were thoroughly explained, informed consent was obtained.  The Pentax Gastroscope [id number] endoscope was introduced through the mouth and advanced to the stomach body. Without limitations.  The instrument was slowly withdrawn as the mucosa was fully examined.   The stomach was filled with solid brown, feculent appearing material.  The gastric anatomy was very abnormal, distorted and twisted.  Limited views of the gastric mucosa showed viable mucosa. I used a snare to pull an OG tube into the stomach.  The tip was clearly within the body of the stomach and appeared to stay there during removal of the scope.  Dr.  Abbey Chatters was present during the case.  Retroflexion was not performed.     The scope was then withdrawn from the patient and the procedure completed. COMPLICATIONS: There were no complications.  ENDOSCOPIC IMPRESSION: The stomach was filled with solid brown, feculent appearing material.  The gastric anatomy was very abnormal, distorted and twisted.  Limited views of the gastric mucosa showed viable mucosa. I used a snare to pull an OG tube into the stomach.  The tip was clearly within the body of the stomach and appeared to stay there during removal of the scope.  Dr.  Abbey Chatters was present during the case.  RECOMMENDATIONS: OG tube to low intermittent suction overnight.  Planning on CT scan tomorrow morning.   eSigned:   Rachael Fee, MD 07/28/2013 5:14 PM

## 2013-07-28 NOTE — Consult Note (Signed)
Reason for Consult: nausea/vomitng and hematemesis Referring Physician: Kirsta Probert is an 72 y.o. female.  HPI: Pt is intubated so history is from chart and her daughter who does not live with her.  She says she has had GI problems for 20 years, so they cannot remember when she was ever normal.  Her daughter was here near the end of Oct, and she was complaining then of trouble after eating.  She would have to lie down, and eventually she would vomit, her daughter report her mom called it, "reflux."   After vomiting she would feel better.  She never ate very much but increased portion size would make this worse.  Last weekend it seemed to be getting worse.  She was vomiting what she thought was bile, but it got so bad she came to the ER early this AM.  The fluid she thought was bile was recognized by the ER as blood.  Labs were not very worrisome, CXR shows a large Hiatal hernia, with no acute chest changes.  2 view abdomen shows large HH, with air fluid level, prior Ct shows an inverted intrathoracic stomach, with concern for gastric volvulus.  Abnormal CT and hiatial hernia are present on CT going back to 2000. She was admitted by Dr. Arbutus Leas to medicine and taken for EGD by Dr. Christella Hartigan.  Written report is not back but he says she has some type of abnormal anatomical finding which he could not delineate at EGD.  He is concerned she has a possible gastric volvulus and we are ask to see.  Past Medical History  Diagnosis Date  Hiatal hernia with hx of  Nissen fundoplication   Recurrent hiatal hernia  Examination under anesthesia, resection of polypoid mass at the dentate line posteriorly using a 4.5 mm Endo stapler and white cartridge Dx:  Polyp at dentate line posteriorly      Chronic pancreatitis non alcoholic, on supplements at home    . GERD (gastroesophageal reflux disease)     Thoracogenic scoliosis of thoracolumbar region   . 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 IBS (irritable bowel syndrome)     Pelvic floor dysfunction    and rectalprolaspe     Colonoscopy with post procedure GI bleed.   Diverticulosis   . Glaucoma/Dry eye syndrome     . Hypertension      . Osteoporosis    . Internal hemorrhoids             Past Surgical History  Procedure Laterality Date  . Nissen fundoplication    . Vaginal hysterectomy  2002    A&P repair/bladder sling  . Cataract extraction      bilateral  . Band hemorrhoidectomy    . Mouth surgery    . Colonoscopy  02/03/2008    diverticulosis, internal hemorrhoids  . Upper gastrointestinal endoscopy  02/12/2008    hiatal henia  . Flexible sigmoidoscopy  11/11/2008    internal hemorrhoids  . Hiatal hernia repair    . Mouth surgery    . Eye surgery      LASER OF BOTH EYES  FOR ELEVATED PRESSURE  . Bladder suspension  2002    Grapey - w/ hysty  . Staple hemorrhoidectomy      Family History  Problem Relation Age  of Onset  . Heart disease Sister   . Liver cancer Sister   . Stroke Father   . Liver cancer Sister   . Brain cancer Sister   . Colon cancer Neg Hx   . Kidney disease Mother   . Lung cancer Brother     Social History:  reports that she has quit smoking. Her smoking use included Cigarettes. She smoked 0.00 packs per day. She has never used smokeless tobacco. She reports that she does not drink alcohol or use illicit drugs.  Allergies:  Allergies  Allergen Reactions  . Codeine     REACTION: Dlizzy    Medications:  Prior to Admission:  Prescriptions prior to admission  Medication Sig Dispense Refill  . alosetron (LOTRONEX) 1 MG tablet Take 1 mg by mouth daily with breakfast.      . amLODipine-benazepril (LOTREL) 5-20 MG per capsule Take 1 capsule by mouth daily after breakfast. Patient took at home prior to arrival to Short Stay      . Artificial Tear Ointment  (REFRESH LACRI-LUBE) OINT Apply 1 drop to eye 4 (four) times daily.       Marland Kitchen aspirin 81 MG tablet Take 81 mg by mouth daily after breakfast.       . ibandronate (BONIVA) 150 MG tablet Take 150 mg by mouth every 3 (three) months. Take 1 tablet daily for one week then off one week      . loperamide (IMODIUM) 2 MG capsule Take 2 mg by mouth daily with breakfast.      . Multiple Vitamins-Minerals (CENTRUM ULTRA WOMENS) TABS Take 1 tablet by mouth daily.      Marland Kitchen omeprazole (PRILOSEC) 20 MG capsule Take 20 mg by mouth daily before breakfast.      . Pancrelipase, Lip-Prot-Amyl, (CREON) 24000 UNITS CPEP Take 3 capsules by mouth 3 (three) times daily with meals. Take 3 caps with each meal and 3 caps with each snack      . Tafluprost (ZIOPTAN) 0.0015 % SOLN Apply 1 drop to eye at bedtime.       . timolol (BETIMOL) 0.5 % ophthalmic solution Place 1 drop into both eyes 2 (two) times daily.      . Vitamin D, Ergocalciferol, (DRISDOL) 50000 UNITS CAPS Take 50,000 Units by mouth every 14 (fourteen) days.       . Wheat Dextrin (BENEFIBER) TABS Take 2 tablets by mouth 2 (two) times daily.       Scheduled: . carboxymethylcellulose  1 drop Both Eyes TID WC & HS  . [employer] EYE LUBRICANT NIGHTTIME  1 drop Ophthalmic QHS  . pantoprazole (PROTONIX) IV  40 mg Intravenous Q24H  . Tafluprost  1 drop Ophthalmic QHS  . timolol  1 drop Both Eyes BID   Continuous: . sodium chloride 100 mL/hr at 07/28/13 1310  . dextrose 5 % and 0.45 % NaCl with KCl 20 mEq/L 75 mL/hr at 07/28/13 1706   ZOX:[id number], fentaNYL, midazolam, ondansetron (ZOFRAN) IV Anti-infectives   None      Results for orders placed during the hospital encounter of 07/28/13 (from the past 48 hour(s))  CBC WITH DIFFERENTIAL     Status: Abnormal   Collection Time    07/28/13  9:00 AM      Result Value Range   WBC 10.8 (*) 4.0 - 10.5 K/uL   RBC 4.63  3.87 - 5.11 MIL/uL   Hemoglobin 14.1  12.0 - 15.0 g/dL   HCT 14.7  82.9 - 56.2 %  MCV 89.0  78.0 -  100.0 fL   MCH 30.5  26.0 - 34.0 pg   MCHC 34.2  30.0 - 36.0 g/dL   RDW 16.1  09.6 - 04.5 %   Platelets 312  150 - 400 K/uL   Neutrophils Relative % 86 (*) 43 - 77 %   Neutro Abs 9.3 (*) 1.7 - 7.7 K/uL   Lymphocytes Relative 7 (*) 12 - 46 %   Lymphs Abs 0.8  0.7 - 4.0 K/uL   Monocytes Relative 7  3 - 12 %   Monocytes Absolute 0.8  0.1 - 1.0 K/uL   Eosinophils Relative 0  0 - 5 %   Eosinophils Absolute 0.0  0.0 - 0.7 K/uL   Basophils Relative 0  0 - 1 %   Basophils Absolute 0.0  0.0 - 0.1 K/uL  COMPREHENSIVE METABOLIC PANEL     Status: Abnormal   Collection Time    07/28/13  9:00 AM      Result Value Range   Sodium 143  135 - 145 mEq/L   Potassium 4.0  3.5 - 5.1 mEq/L   Chloride 98  96 - 112 mEq/L   CO2 30  19 - 32 mEq/L   Glucose, Bld 181 (*) 70 - 99 mg/dL   BUN 30 (*) 6 - 23 mg/dL   Creatinine, Ser 4.09  0.50 - 1.10 mg/dL   Calcium 81.1  8.4 - 91.4 mg/dL   Total Protein 7.9  6.0 - 8.3 g/dL   Albumin 4.2  3.5 - 5.2 g/dL   AST 32  0 - 37 U/L   ALT 18  0 - 35 U/L   Alkaline Phosphatase 99  39 - 117 U/L   Total Bilirubin 0.9  0.3 - 1.2 mg/dL   GFR calc non Af Amer 84 (*) >90 mL/min   GFR calc Af Amer >90  >90 mL/min   Comment: (NOTE)     The eGFR has been calculated using the CKD EPI equation.     This calculation has not been validated in all clinical situations.     eGFR's persistently <90 mL/min signify possible Chronic Kidney     Disease.  APTT     Status: None   Collection Time    07/28/13  9:00 AM      Result Value Range   aPTT 27  24 - 37 seconds  PROTIME-INR     Status: None   Collection Time    07/28/13  9:00 AM      Result Value Range   Prothrombin Time 14.4  11.6 - 15.2 seconds   INR 1.14  0.00 - 1.49  LIPASE, BLOOD     Status: None   Collection Time    07/28/13  9:00 AM      Result Value Range   Lipase 16  11 - 59 U/L  TYPE AND SCREEN     Status: None   Collection Time    07/28/13  9:00 AM      Result Value Range   ABO/RH(D) O POS     Antibody  Screen NEG     Sample Expiration 07/31/2013    POCT I-STAT TROPONIN I     Status: None   Collection Time    07/28/13  9:08 AM      Result Value Range   Troponin i, poc 0.01  0.00 - 0.08 ng/mL   Comment 3  Comment: Due to the release kinetics of cTnI,     a negative result within the first hours     of the onset of symptoms does not rule out     myocardial infarction with certainty.     If myocardial infarction is still suspected,     repeat the test at appropriate intervals.  CG4 I-STAT (LACTIC ACID)     Status: None   Collection Time    07/28/13  9:11 AM      Result Value Range   Lactic Acid, Venous 2.20  0.5 - 2.2 mmol/L  OCCULT BLOOD GASTRIC / DUODENUM (SPECIMEN CUP)     Status: Abnormal   Collection Time    07/28/13  9:53 AM      Result Value Range   pH, Gastric INDETERMINATE     Occult Blood, Gastric POSITIVE (*) NEGATIVE    Dg Chest Port 1 View  07/28/2013   CLINICAL DATA:  ET tube placement.  EXAM: PORTABLE CHEST - 1 VIEW  COMPARISON:  07/28/2013  FINDINGS: There is an endotracheal tube with the tip at the proximal most portion of the right mainstem bronchus. There is no focal parenchymal opacity. There is no pleural effusion or pneumothorax. Stable heart mediastinum. Large hiatal hernia. The osseous structures are unremarkable.  IMPRESSION: There is an endotracheal tube with the tip at the proximal most portion of the right mainstem bronchus. The endotracheal tube has already been retracted 3 cm by the time of this dictation. These results were called by telephone at the time of interpretation on 07/28/2013 at 4:12 PM to Rennis Petty, RN, who verbally acknowledged these results.   Electronically Signed   By: Elige Ko   On: 07/28/2013 16:13   Dg Chest Port 1 View  07/28/2013   CLINICAL DATA:  72 year old female with chest pain nausea and vomiting. Initial encounter.  EXAM: PORTABLE CHEST - 1 VIEW  COMPARISON:  12/11/2011 and earlier.  FINDINGS: Portable AP semi  upright view at 0914 hrs. Large hiatal hernia. Stable lung volumes. Other mediastinal contours are within normal limits. Visualized tracheal air column is within normal limits. Allowing for portable technique, the lungs are clear. No pneumothorax. No pneumoperitoneum identified. Left upper quadrant surgical clips.  IMPRESSION: No acute cardiopulmonary abnormality.  Large hiatal hernia.   Electronically Signed   By: Augusto Gamble M.D.   On: 07/28/2013 09:27   Dg Abd 2 Views  07/28/2013   CLINICAL DATA:  Vomiting, known hiatal hernia, findings worrisome for gastric volvulus on endoscopy  EXAM: ABDOMEN - 2 VIEW  COMPARISON:  CT abdomen pelvis dated 02/12/2008  FINDINGS: Nonspecific bowel gas pattern without findings suspicious for obstruction.  No evidence of free air on the lateral decubitus view.  Large hiatal hernia with air-fluid level on the decubitus view.  Severe thoracolumbar scoliosis.  When correlating with the prior CT, there is a large hiatal hernia with inverted intrathoracic stomach (greater curvature flipped superiorly) on that study.  IMPRESSION: No findings to suggest bowel obstruction or free air.  Large hiatal hernia with air-fluid level on the decubitus view.  Prior CT demonstrates a large hiatal hernia with inverted intrathoracic stomach. If there is clinical concern for superimposed acute gastric volvulus, consider CT.  These results were called by telephone at the time of interpretation on 07/28/2013 at 3:20 PM to Dr. Wendall Papa, who verbally acknowledged these results.   Electronically Signed   By: Charline Bills M.D.   On: 07/28/2013 15:23  Review of Systems  Unable to perform ROS: intubated  ROS on admit no additional issues, her daughter says she has lost some weight but no one is sure how much or the time period involved. Blood pressure 133/96, pulse 104, temperature 98 F (36.7 C), temperature source Oral, resp. rate 14, SpO2 100.00%. Physical Exam  Constitutional: She is  oriented to person, place, and time.  Thin frail WF intubated on the Vent. BP 133/96  Pulse 104  Temp(Src) 98 F (36.7 C) (Oral)  Resp 14  SpO2 100%   HENT:  Head: Normocephalic and atraumatic.  Oral intubation Green bile coming from suction above the balloon.  Eyes: Right eye exhibits no discharge. Left eye exhibits no discharge. No scleral icterus.  Neck: Normal range of motion. Neck supple. No JVD present. No tracheal deviation present. No thyromegaly present.  Cardiovascular: Regular rhythm, normal heart sounds and intact distal pulses.  Exam reveals no gallop.   No murmur heard. tachycardic  Respiratory: No respiratory distress. She has no wheezes. She has no rales. She exhibits no tenderness.  GI: Soft. She exhibits no distension and no mass. There is no tenderness. There is no rebound and no guarding.  Intubated and sedated.  Musculoskeletal: She exhibits no edema.  Lymphadenopathy:    She has no cervical adenopathy.  Neurological: She is alert and oriented to person, place, and time. No cranial nerve deficit.  Skin: Skin is warm and dry. No rash noted. No erythema. No pallor.  Psychiatric:  Sedated on vent.    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 Chronic pancreatitis with supplements at home.  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  She has most likely aspirated based on findings here.  We plan to decompress her tonight with NG, continue vent support, CCM support and aim for CT scan tomorrow.  The  stomach did not show any signs of ischemia on EGD.  We will follow with you.  Jermari Tamargo 07/28/2013, 4:42 PM

## 2013-07-28 NOTE — ED Provider Notes (Signed)
CSN: [id number]     Arrival date & time 07/28/13  6578 History   First MD Initiated Contact with Patient 07/28/13 0840     Chief Complaint  Patient presents with  . Nausea  . GI Bleeding   (Consider location/radiation/quality/duration/timing/severity/associated sxs/prior Treatment) HPI Comments: Pt takes a baby aspirin, has a h/o large hiatal hernia in the past, has had upper GI bleeding in the past.  Dr. Leone Payor is her GI physician.  She has had a [employer] Fundiplication based on prior records.  Pt felt constipated 2 day  Ago, now is soft.  Pt had some left side abd pain 1-2 days ago, seems resolved now, gets pain in abd, crampy as she is vomiting.  Emesis has been dark in color, now more reddish in color.  Pt denies CP, SOB, light headed.  Vomiting was less yesterday, but began more in earnest early this AM and has been more continuous now.    Patient is a 72 y.o. female presenting with vomiting. The history is provided by the patient, medical records and a relative.  Emesis Severity:  Severe Duration:  1 day Timing:  Constant Progression:  Worsening Chronicity:  New Context: not post-tussive and not self-induced   Relieved by:  Nothing Worsened by:  Nothing tried Associated symptoms: abdominal pain   Associated symptoms: no arthralgias, no chills, no cough and no diarrhea     Past Medical History  Diagnosis Date  . Glaucoma   . Dry eye syndrome   . GI bleed   . Diverticulosis   . GERD (gastroesophageal reflux disease)   . Thoracogenic scoliosis of thoracolumbar region     SEVERE  . Chronic pancreatitis   . IBS (irritable bowel syndrome)   . Pelvic floor dysfunction     and rectalprolaspe  . Osteoporosis   . Internal hemorrhoids   . Hypertension   . Dry mouth   . Blood transfusion     HEMORRHAGED  1 WEEK AFTER COLONOSCOPY  -REQUIRED TRANSFUSION  . Abscessed tooth     STATES TOOTH PULLED RIGHT LOWER MOLAR   AND LEFT LOWER MOLAR ON3/25/13 AND PT ON ANTIBIOTIC  . Hiatal  hernia     LARGE HIATAL HERNIA PER CXR REPORT  . 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 Nissen fundoplication    . Vaginal hysterectomy  2002    A&P repair/bladder sling  . Cataract extraction      bilateral  . Band hemorrhoidectomy    . Mouth surgery    . Colonoscopy  02/03/2008    diverticulosis, internal hemorrhoids  . Upper gastrointestinal endoscopy  02/12/2008    hiatal henia  . Flexible sigmoidoscopy  11/11/2008    internal hemorrhoids  . Hiatal hernia repair    . Mouth surgery    . Eye surgery      LASER OF BOTH EYES  FOR ELEVATED PRESSURE  . Bladder suspension  2002    Grapey - w/ hysty  . Staple hemorrhoidectomy     Family History  Problem Relation Age of Onset  . Heart disease Sister   . Liver cancer Sister   . Stroke Father   . Liver cancer Sister   . Brain cancer Sister   . Colon cancer Neg Hx   . Kidney disease Mother   . Lung cancer Brother    History  Substance Use Topics  . Smoking status: Former Games developer  . Smokeless tobacco: Never Used     Comment: ONLY SMOKED AS A TEEN  . Alcohol Use: No   OB History   Grav Para Term Preterm Abortions TAB SAB Ect Mult Living                 Review of Systems  Unable to perform ROS: Acuity of condition  Constitutional: Negative for chills.  Gastrointestinal: Positive for vomiting and abdominal pain. Negative for diarrhea.  Musculoskeletal: Negative for arthralgias.    Allergies  Codeine  Home Medications   Current Outpatient Rx  Name  Route  Sig  Dispense  Refill  . alosetron (LOTRONEX) 1 MG tablet      TAKE 1 TABLET BY MOUTH TWICE A DAY AS NEEDED   60 tablet   10   . amLODipine-benazepril (LOTREL) 5-20 MG per capsule   Oral   Take 1 capsule by mouth daily after breakfast. Patient took at home prior to arrival to Short Stay         . amoxicillin (AMOXIL)  875 MG tablet   Oral   Take 875 mg by mouth 2 (two) times daily.         . Artificial Tear Ointment (REFRESH LACRI-LUBE) OINT   Ophthalmic   Apply 1 drop to eye.         Marland Kitchen aspirin 81 MG tablet   Oral   Take 81 mg by mouth daily after breakfast.          . brimonidine (ALPHAGAN) 0.2 % ophthalmic solution   Both Eyes   Place 1 drop into both eyes 2 (two) times daily.         . calcium carbonate (TUMS - DOSED IN MG ELEMENTAL CALCIUM) 500 MG chewable tablet   Oral   Chew 1 tablet by mouth as needed. Heart burn          . CREON 24000 UNITS CPEP      TAKE 3 CAPSULES BY MOUTH WITH EACH MEAL AND TAKE ONE CAPSULE WITH EACH SNACK   330 capsule   1   . dorzolamide (TRUSOPT) 2 % ophthalmic solution   Both Eyes   Place 1 drop into both eyes 2 (two) times daily.          Marland Kitchen ibandronate (BONIVA) 150 MG tablet   Oral   Take 150 mg by mouth every 3 (three) months. Take in the morning with a full glass of water, on an empty stomach, and do not take anything else by mouth or lie down for the next 30 min.         Marland Kitchen loperamide (IMODIUM) 1 MG/5ML solution   Oral   Take 1 mg by mouth daily.         . Multiple Vitamins-Minerals (CENTRUM ULTRA WOMENS) TABS   Oral   Take 1 tablet by mouth daily.         Marland Kitchen omeprazole (PRILOSEC) 20 MG capsule   Oral   Take 1 capsule (20 mg total) by mouth daily before breakfast.   90 capsule   3   . Pancrelipase, Lip-Prot-Amyl, (CREON) 24000 UNITS CPEP      TAKE 3 CAPSULES BY MOUTH WITH EACH MEAL AND TAKE ONE CAPSULE WITH EACH SNACK   330 capsule   10     PT REQUESTS REFILLS FOR THE 330 QTY. INS WILL ONLY .Marland Kitchen.   . Tafluprost (ZIOPTAN) 0.0015 % SOLN  Ophthalmic   Apply 1 drop to eye daily.         . timolol (BETIMOL) 0.5 % ophthalmic solution   Both Eyes   Place 1 drop into both eyes 2 (two) times daily.         . timolol (TIMOPTIC-XR) 0.5 % ophthalmic gel-forming   Both Eyes   Place 1 drop into both eyes daily.          . Vitamin D, Ergocalciferol, (DRISDOL) 50000 UNITS CAPS   Oral   Take 50,000 Units by mouth every 14 (fourteen) days.          . Wheat Dextrin (BENEFIBER) TABS   Oral   Take 2 tablets by mouth 2 (two) times daily.          BP 123/85  Pulse 115  Temp(Src) 98 F (36.7 C) (Oral)  Resp 18  SpO2 97% Physical Exam  Nursing note and vitals reviewed. Constitutional: She is oriented to person, place, and time. She appears well-developed and well-nourished. She is cooperative.  Non-toxic appearance. No distress.  HENT:  Head: Normocephalic and atraumatic.  Mouth/Throat: Mucous membranes are dry.  Appearance of old, dark blood in orophyarynx  Eyes: Conjunctivae and EOM are normal.  Neck: Neck supple.  Cardiovascular: Regular rhythm and intact distal pulses.  Tachycardia present.   Pulmonary/Chest: Effort normal. No respiratory distress. She has no wheezes.  Abdominal: Soft. She exhibits no distension. There is no tenderness. There is no rebound.  Neurological: She is alert and oriented to person, place, and time. Coordination normal.  Skin: Skin is warm. She is not diaphoretic.    ED Course  Procedures (including critical care time)   CRITICAL CARE Performed by: Lear Ng. Total critical care time:35 min Critical care time was exclusive of separately billable procedures and treating other patients. Critical care was necessary to treat or prevent imminent or life-threatening deterioration. Critical care was time spent personally by me on the following activities: development of treatment plan with patient and/or surrogate as well as nursing, discussions with consultants, evaluation of patient's response to treatment, examination of patient, obtaining history from patient or surrogate, ordering and performing treatments and interventions, ordering and review of laboratory studies, ordering and review of radiographic studies, pulse oximetry and re-evaluation of patient's  condition.   Labs Review Labs Reviewed  OCCULT BLOOD GASTRIC / DUODENUM (SPECIMEN CUP) - Abnormal; Notable for the following:    Occult Blood, Gastric POSITIVE (*)    All other components within normal limits  CBC WITH DIFFERENTIAL - Abnormal; Notable for the following:    WBC 10.8 (*)    Neutrophils Relative % 86 (*)    Neutro Abs 9.3 (*)    Lymphocytes Relative 7 (*)    All other components within normal limits  COMPREHENSIVE METABOLIC PANEL - Abnormal; Notable for the following:    Glucose, Bld 181 (*)    BUN 30 (*)    GFR calc non Af Amer 84 (*)    All other components within normal limits  APTT  PROTIME-INR  LIPASE, BLOOD  PH, GASTRIC FLUID (GASTROCCULT CARD)  CG4 I-STAT (LACTIC ACID)  POCT I-STAT TROPONIN I  TYPE AND SCREEN   Imaging Review Dg Chest Port 1 View  07/28/2013   CLINICAL DATA:  72 year old female with chest pain nausea and vomiting. Initial encounter.  EXAM: PORTABLE CHEST - 1 VIEW  COMPARISON:  12/11/2011 and earlier.  FINDINGS: Portable AP semi upright view at 0914 hrs. Large hiatal hernia. Stable  lung volumes. Other mediastinal contours are within normal limits. Visualized tracheal air column is within normal limits. Allowing for portable technique, the lungs are clear. No pneumothorax. No pneumoperitoneum identified. Left upper quadrant surgical clips.  IMPRESSION: No acute cardiopulmonary abnormality.  Large hiatal hernia.   Electronically Signed   By: Augusto Gamble M.D.   On: 07/28/2013 09:27    EKG Interpretation    Date/Time:  Tuesday July 28 2013 09:27:25 EST Ventricular Rate:  121 PR Interval:  140 QRS Duration: 89 QT Interval:  348 QTC Calculation: 494 R Axis:   -62 Text Interpretation:  Sinus tachycardia Left atrial enlargement Left anterior fascicular block Abnormal R-wave progression, late transition LVH with secondary repolarization abnormality Borderline prolonged QT interval Since last tracing rate faster Abnormal ECG           ra  sat is 94% and I interpret to be adequate  10:09 AM Patient remains hemodynamically stable with normotensive. Patient's heart rate has improved to 1:15. She denies any lightheadedness, reports her nausea and emesis as significantly improved. Patient did have a GI bleed secondary to complications from a colonoscopy in the past. During that time she did have an NG tube and reports that that was a very unpleasant experience and would prefer not to have another NG tube at this time. I did explain to the patient and family that that was likely indicated given the suspicion that she is having an upper GI bleed. For now we'll continue to hydrate with IV fluids, continue monitoring, will consult GI as patient will need an upper endoscopy in the near future, and will speak to the hospitalist as I bleed the patient is stable to be admitted to a non-ICU setting probably a step down unit.   10:20 AM Discussed case with Dr. Christella Hartigan who will consult on patient.  Awaiting call back from hospitalist.  MDM   1. Upper GI bleed   2. Nausea and vomiting in adult   3. Hiatal hernia   4. History of Nissen fundoplication        Pt with multiple episodes of emesis of apparent blood in the ED.  Pt is tachycardic, but normotensive for now.  2 IV's, placed, labs, type and screen, Gastroccult are ordered.  PCXR I reviewed myself, large hernia appreciated, no free air.  Abd is soft, no guarding at present.  Will start IVF's, IV protonix for UGI bleed, anticipate consultation with Dr. Leone Payor and admission.      Gavin Pound. Jisella Ashenfelter, MD 07/28/13 1025

## 2013-07-28 NOTE — H&P (Signed)
PULMONARY  / CRITICAL CARE MEDICINE  Name: [name] MRN: [id number] DOB: 11-19-1940    ADMISSION DATE:  07/28/2013 CONSULTATION DATE:  11/19  REFERRING MD :  Christella Hartigan PRIMARY SERVICE: [facility] >> PCCM  CHIEF COMPLAINT: intractable vomiting. Intubated to protect airwaY  BRIEF PATIENT DESCRIPTION:  40 F with large HH adm 11/18 by [facility] with intractable vomiting and concern for UGIB. Underwent EGD 11/19 revealing possible gastric volvulus. Intubated after EGD to protect airway and permit completion of eval.   SIGNIFICANT EVENTS / STUDIES:  11/19 EGD: stomach was filled with solid brown, feculent appearing material. The gastric anatomy was very abnormal, distorted and twisted 11/19 CT chest/abd/pelvis:    LINES / TUBES: ETT 11/19 >>   CULTURES:   ANTIBIOTICS:   HISTORY OF PRESENT ILLNESS:   Admission history reviewed. Findings on EGD discussed with Dr Christella Hartigan. Pt unable to provide history to due sedated condition after EGD.  PAST MEDICAL HISTORY :  Past Medical History  Diagnosis Date  . Glaucoma   . Dry eye syndrome   . GI bleed   . Diverticulosis   . GERD (gastroesophageal reflux disease)   . Thoracogenic scoliosis of thoracolumbar region     SEVERE  . Chronic pancreatitis   . IBS (irritable bowel syndrome)   . Pelvic floor dysfunction     and rectalprolaspe  . Osteoporosis   . Internal hemorrhoids   . Hypertension   . Dry mouth   . Blood transfusion     HEMORRHAGED  1 WEEK AFTER COLONOSCOPY  -REQUIRED TRANSFUSION  . Abscessed tooth     STATES TOOTH PULLED RIGHT LOWER MOLAR   AND LEFT LOWER MOLAR ON3/25/13 AND PT ON ANTIBIOTIC  . Hiatal hernia     LARGE HIATAL HERNIA PER CXR REPORT  . 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 Nissen fundoplication    . Vaginal hysterectomy  2002    A&P repair/bladder sling  . Cataract  extraction      bilateral  . Band hemorrhoidectomy    . Mouth surgery    . Colonoscopy  02/03/2008    diverticulosis, internal hemorrhoids  . Upper gastrointestinal endoscopy  02/12/2008    hiatal henia  . Flexible sigmoidoscopy  11/11/2008    internal hemorrhoids  . Hiatal hernia repair    . Mouth surgery    . Eye surgery      LASER OF BOTH EYES  FOR ELEVATED PRESSURE  . Bladder suspension  2002    Grapey - w/ hysty  . Staple hemorrhoidectomy     Prior to Admission medications   Medication Sig Start Date End Date Taking? Authorizing Provider  alosetron (LOTRONEX) 1 MG tablet Take 1 mg by mouth daily with breakfast. 12/11/12  Yes Iva Boop, MD  amLODipine-benazepril (LOTREL) 5-20 MG per capsule Take 1 capsule by mouth daily after breakfast. Patient took at home prior to arrival to Short Stay   Yes Historical Provider, MD  Artificial Tear Ointment (REFRESH LACRI-LUBE) OINT Apply 1 drop to eye 4 (four) times daily.    Yes Historical Provider, MD  aspirin 81 MG tablet Take 81 mg by mouth daily after breakfast.    Yes Historical Provider, MD  ibandronate (BONIVA) 150 MG tablet Take 150 mg by mouth every 3 (three) months. Take 1 tablet daily for one week then off one  week   Yes Historical Provider, MD  loperamide (IMODIUM) 2 MG capsule Take 2 mg by mouth daily with breakfast.   Yes Historical Provider, MD  Multiple Vitamins-Minerals (CENTRUM ULTRA WOMENS) TABS Take 1 tablet by mouth daily.   Yes Historical Provider, MD  omeprazole (PRILOSEC) 20 MG capsule Take 20 mg by mouth daily before breakfast. 10/29/12  Yes Iva Boop, MD  Pancrelipase, Lip-Prot-Amyl, (CREON) 24000 UNITS CPEP Take 3 capsules by mouth 3 (three) times daily with meals. Take 3 caps with each meal and 3 caps with each snack   Yes Historical Provider, MD  Tafluprost (ZIOPTAN) 0.0015 % SOLN Apply 1 drop to eye at bedtime.    Yes Historical Provider, MD  timolol (BETIMOL) 0.5 % ophthalmic solution Place 1 drop into both eyes  2 (two) times daily.   Yes Historical Provider, MD  Vitamin D, Ergocalciferol, (DRISDOL) 50000 UNITS CAPS Take 50,000 Units by mouth every 14 (fourteen) days.    Yes Historical Provider, MD  Wheat Dextrin (BENEFIBER) TABS Take 2 tablets by mouth 2 (two) times daily.   Yes Historical Provider, MD   Allergies  Allergen Reactions  . Codeine     REACTION: Dlizzy    FAMILY HISTORY:  Family History  Problem Relation Age of Onset  . Heart disease Sister   . Liver cancer Sister   . Stroke Father   . Liver cancer Sister   . Brain cancer Sister   . Colon cancer Neg Hx   . Kidney disease Mother   . Lung cancer Brother    SOCIAL HISTORY: Reviewed - former smoker. No EtOH  REVIEW OF SYSTEMS:  N/A  SUBJECTIVE:   VITAL SIGNS: Temp:  [98 F (36.7 C)-98.3 F (36.8 C)] 98 F (36.7 C) (11/18 0943) Pulse Rate:  [94-127] 98 (11/18 1926) Resp:  [13-36] 14 (11/18 1926) BP: (104-182)/(70-124) 126/86 mmHg (11/18 1926) SpO2:  [93 %-100 %] 100 % (11/18 1926) FiO2 (%):  [40 %-100 %] 40 % (11/18 1926) Weight:  [42.9 kg (94 lb 9.2 oz)] 42.9 kg (94 lb 9.2 oz) (11/18 1700) HEMODYNAMICS:   VENTILATOR SETTINGS: Vent Mode:  [-] PRVC FiO2 (%):  [40 %-100 %] 40 % Set Rate:  [14 bmp] 14 bmp Vt Set:  [400 mL] 400 mL PEEP:  [5 cmH20] 5 cmH20 Plateau Pressure:  [15 cmH20-16 cmH20] 15 cmH20 INTAKE / OUTPUT: Intake/Output     11/18 0701 - 11/19 0700   I.V. (mL/kg) 500 (11.7)   Total Intake(mL/kg) 500 (11.7)   Urine (mL/kg/hr) 450   Emesis/NG output 20   Total Output 470   Net +30         PHYSICAL EXAMINATION: General:  Sedated, NAD Neuro:  RASS -3, not F/C, MAEs, no focal deficits HEENT: poor dentition, feculent staining of oropharynx Cardiovascular:  RRR s M Lungs: few scattered rhonchi Abdomen:  Scaphoid, soft, NT, no HSM, few BS throughout Ext: warm, no edema  LABS: I have reviewed all of today's lab results. 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 CRITICAL CARE: The patient is critically ill with multiple organ systems failure and requires high complexity decision making for assessment and support, frequent evaluation and titration of therapies, application of advanced monitoring technologies and extensive interpretation of multiple databases. Critical Care Time devoted to patient care services described in this note is 35 minutes.   Billy Fischer, MD ; [facility] [phone].  After 5:30 PM or weekends, call [phone]  Pulmonary and Critical Care Medicine [facility] Pager: [phone]  07/28/2013, 7:42 PM

## 2013-07-28 NOTE — Consult Note (Signed)
Patient seen and examined.  Plan as per Betha Loa note.  Discussed with family.

## 2013-07-28 NOTE — H&P (View-Only) (Signed)
Referring Provider: No ref. provider found Primary Care Physician:  Hollice Espy, MD Primary Gastroenterologist:  Dr. Leone Payor  Reason for Consultation:  Vomiting; hematemesis  HPI: [name] is a 72 y.o. female with a history of large hiatal hernia s/p Nissen fundoplication in the past, diverticulosis, GERD, IBS, glaucoma, and hypertension.  She presents to [facility] ED with one-day history of intractable vomiting.  She had too numerous to count episodes of vomitus yesterday. She noted some coffee grounds as well as a small amount of bright red blood. She called her Dr. Christella Hartigan on-call last evening who suggested that the patient go to the emergency department, which she did this morning. She denies any recent use of NSAIDS or other new meds. She denies fevers, chills, chest pain, shortness of breath, diarrhea, and dark or bloody stools. She does complain of some dizziness since her vomiting started.  Says that recently it felt like she was having more issues with reflux and then she would sometimes vomit, which contained the food she had eaten.  In the ED, the patient continues to have a moderate amount of emesis with coffee grounds material during my examination.  No frank blood seen.  The patient's blood pressure is stable. Initially, the patient had a heart rate 127 which improved to 110s after one liter NS. Hemoglobin was 14.1, WBC 10.8. BMP and hepatic enzymes were unremarkable. Lipase was 16. Chest x-ray was the only imaging performed, but did not show any infiltrates or edema.  She has received one dose of zofran and a dose of reglan as well.  INR was 1.14, PTT 27, lactic acid 2.20.  Has been placed on PPI gtt.    Past Medical History  Diagnosis Date  . Glaucoma   . Dry eye syndrome   . GI bleed   . Diverticulosis   . GERD (gastroesophageal reflux disease)   . Thoracogenic scoliosis of thoracolumbar region     SEVERE  . Chronic pancreatitis   . IBS (irritable bowel syndrome)   . Pelvic  floor dysfunction     and rectalprolaspe  . Osteoporosis   . Internal hemorrhoids   . Hypertension   . Dry mouth   . Blood transfusion     HEMORRHAGED  1 WEEK AFTER COLONOSCOPY  -REQUIRED TRANSFUSION  . Abscessed tooth     STATES TOOTH PULLED RIGHT LOWER MOLAR   AND LEFT LOWER MOLAR ON3/25/13 AND PT ON ANTIBIOTIC  . Hiatal hernia     LARGE HIATAL HERNIA PER CXR REPORT  . 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 Nissen fundoplication    . Vaginal hysterectomy  2002    A&P repair/bladder sling  . Cataract extraction      bilateral  . Band hemorrhoidectomy    . Mouth surgery    . Colonoscopy  02/03/2008    diverticulosis, internal hemorrhoids  . Upper gastrointestinal endoscopy  02/12/2008    hiatal henia  . Flexible sigmoidoscopy  11/11/2008    internal hemorrhoids  . Hiatal hernia repair    . Mouth surgery    . Eye surgery      LASER OF BOTH EYES  FOR ELEVATED PRESSURE  . Bladder suspension  2002    Grapey - w/ hysty  . Staple hemorrhoidectomy      Prior to Admission medications   Medication Sig  Start Date End Date Taking? Authorizing Provider  alosetron (LOTRONEX) 1 MG tablet TAKE 1 TABLET BY MOUTH TWICE A DAY AS NEEDED 12/11/12   Iva Boop, MD  amLODipine-benazepril (LOTREL) 5-20 MG per capsule Take 1 capsule by mouth daily after breakfast. Patient took at home prior to arrival to Short Stay    Historical Provider, MD  amoxicillin (AMOXIL) 875 MG tablet Take 875 mg by mouth 2 (two) times daily.    Historical Provider, MD  Artificial Tear Ointment (REFRESH LACRI-LUBE) OINT Apply 1 drop to eye.    Historical Provider, MD  aspirin 81 MG tablet Take 81 mg by mouth daily after breakfast.     Historical Provider, MD  brimonidine (ALPHAGAN) 0.2 % ophthalmic solution Place 1 drop into both eyes 2 (two) times daily.    Historical Provider,  MD  calcium carbonate (TUMS - DOSED IN MG ELEMENTAL CALCIUM) 500 MG chewable tablet Chew 1 tablet by mouth as needed. Heart burn     Historical Provider, MD  CREON 24000 UNITS CPEP TAKE 3 CAPSULES BY MOUTH WITH EACH MEAL AND TAKE ONE CAPSULE WITH EACH SNACK 07/24/13   Iva Boop, MD  dorzolamide (TRUSOPT) 2 % ophthalmic solution Place 1 drop into both eyes 2 (two) times daily.     Historical Provider, MD  ibandronate (BONIVA) 150 MG tablet Take 150 mg by mouth every 3 (three) months. Take in the morning with a full glass of water, on an empty stomach, and do not take anything else by mouth or lie down for the next 30 min.    Historical Provider, MD  loperamide (IMODIUM) 1 MG/5ML solution Take 1 mg by mouth daily.    Historical Provider, MD  Multiple Vitamins-Minerals (CENTRUM ULTRA WOMENS) TABS Take 1 tablet by mouth daily.    Historical Provider, MD  omeprazole (PRILOSEC) 20 MG capsule Take 1 capsule (20 mg total) by mouth daily before breakfast. 10/29/12   Iva Boop, MD  Pancrelipase, Lip-Prot-Amyl, (CREON) 24000 UNITS CPEP TAKE 3 CAPSULES BY MOUTH WITH EACH MEAL AND TAKE ONE CAPSULE WITH EACH [facility] 10/29/12   Iva Boop, MD  Tafluprost (ZIOPTAN) 0.0015 % SOLN Apply 1 drop to eye daily.    Historical Provider, MD  timolol (BETIMOL) 0.5 % ophthalmic solution Place 1 drop into both eyes 2 (two) times daily.    Historical Provider, MD  timolol (TIMOPTIC-XR) 0.5 % ophthalmic gel-forming Place 1 drop into both eyes daily.    Historical Provider, MD  Vitamin D, Ergocalciferol, (DRISDOL) 50000 UNITS CAPS Take 50,000 Units by mouth every 14 (fourteen) days.     Historical Provider, MD  Wheat Dextrin (BENEFIBER) TABS Take 2 tablets by mouth 2 (two) times daily.    Historical Provider, MD    Current Facility-Administered Medications  Medication Dose Route Frequency Provider Last Rate Last Dose  . 0.9 %  sodium chloride infusion   Intravenous Once Gavin Pound. Ghim, MD      . pantoprazole  (PROTONIX) 80 mg in sodium chloride 0.9 % 250 mL infusion  8 mg/hr Intravenous Continuous Gavin Pound. Ghim, MD 25 mL/hr at 07/28/13 1010 8 mg/hr at 07/28/13 1010   Current Outpatient Prescriptions  Medication Sig Dispense Refill  . alosetron (LOTRONEX) 1 MG tablet TAKE 1 TABLET BY MOUTH TWICE A DAY AS NEEDED  60 tablet  10  . amLODipine-benazepril (LOTREL) 5-20 MG per capsule Take 1 capsule by mouth daily after breakfast. Patient took at home prior to arrival to Short Stay      .  amoxicillin (AMOXIL) 875 MG tablet Take 875 mg by mouth 2 (two) times daily.      . Artificial Tear Ointment (REFRESH LACRI-LUBE) OINT Apply 1 drop to eye.      Marland Kitchen aspirin 81 MG tablet Take 81 mg by mouth daily after breakfast.       . brimonidine (ALPHAGAN) 0.2 % ophthalmic solution Place 1 drop into both eyes 2 (two) times daily.      . calcium carbonate (TUMS - DOSED IN MG ELEMENTAL CALCIUM) 500 MG chewable tablet Chew 1 tablet by mouth as needed. Heart burn       . CREON 24000 UNITS CPEP TAKE 3 CAPSULES BY MOUTH WITH EACH MEAL AND TAKE ONE CAPSULE WITH EACH SNACK  330 capsule  1  . dorzolamide (TRUSOPT) 2 % ophthalmic solution Place 1 drop into both eyes 2 (two) times daily.       Marland Kitchen ibandronate (BONIVA) 150 MG tablet Take 150 mg by mouth every 3 (three) months. Take in the morning with a full glass of water, on an empty stomach, and do not take anything else by mouth or lie down for the next 30 min.      Marland Kitchen loperamide (IMODIUM) 1 MG/5ML solution Take 1 mg by mouth daily.      . Multiple Vitamins-Minerals (CENTRUM ULTRA WOMENS) TABS Take 1 tablet by mouth daily.      Marland Kitchen omeprazole (PRILOSEC) 20 MG capsule Take 1 capsule (20 mg total) by mouth daily before breakfast.  90 capsule  3  . Pancrelipase, Lip-Prot-Amyl, (CREON) 24000 UNITS CPEP TAKE 3 CAPSULES BY MOUTH WITH EACH MEAL AND TAKE ONE CAPSULE WITH EACH SNACK  330 capsule  10  . Tafluprost (ZIOPTAN) 0.0015 % SOLN Apply 1 drop to eye daily.      . timolol (BETIMOL) 0.5  % ophthalmic solution Place 1 drop into both eyes 2 (two) times daily.      . timolol (TIMOPTIC-XR) 0.5 % ophthalmic gel-forming Place 1 drop into both eyes daily.      . Vitamin D, Ergocalciferol, (DRISDOL) 50000 UNITS CAPS Take 50,000 Units by mouth every 14 (fourteen) days.       . Wheat Dextrin (BENEFIBER) TABS Take 2 tablets by mouth 2 (two) times daily.        Allergies as of 07/28/2013 - Review Complete 07/28/2013  Allergen Reaction Noted  . Codeine  01/20/2008    Family History  Problem Relation Age of Onset  . Heart disease Sister   . Liver cancer Sister   . Stroke Father   . Liver cancer Sister   . Brain cancer Sister   . Colon cancer Neg Hx   . Kidney disease Mother   . Lung cancer Brother     History   Social History  . Marital Status: Widowed    Spouse Name: N/A    Number of Children: N/A  . Years of Education: N/A   Occupational History  . former School bus driver    Social History Main Topics  . Smoking status: Former Games developer  . Smokeless tobacco: Never Used     Comment: ONLY SMOKED AS A TEEN  . Alcohol Use: No  . Drug Use: No  . Sexual Activity: Not on file   Other Topics Concern  . Not on file   Social History Narrative  . No narrative on file    Review of Systems: Ten point ROS is O/W negative except as mentioned in HPI.  Physical Exam: Vital  signs in last 24 hours: Temp:  [98 F (36.7 C)-98.3 F (36.8 C)] 98 F (36.7 C) (11/18 0943) Pulse Rate:  [115-127] 115 (11/18 0943) Resp:  [18] 18 (11/18 0943) BP: (115-123)/(85-88) 123/85 mmHg (11/18 0943) SpO2:  [94 %-97 %] 97 % (11/18 0943)   General:  Alert, elderly, pleasant and cooperative in NAD Head:  Normocephalic and atraumatic. Eyes:  Sclera clear, no icterus.  Conjunctiva pink. Ears:  Normal auditory acuity. Mouth:  No deformity or lesions.   Lungs:  Clear throughout to auscultation.  No wheezes, crackles, or rhonchi.  Heart:  Tachy but regular rhythm Abdomen:  Soft,  non-distended.  BS present.  Non-tender.   Rectal:  Deferred.  Msk:  Has severe scoliosis. Pulses:  Normal pulses noted. Extremities:  Without clubbing or edema. Neurologic:  Alert and oriented x4;  grossly normal neurologically. Skin:  Intact without significant lesions or rashes. Psych:  Alert and cooperative. Normal mood and affect.  Lab Results:  Recent Labs  07/28/13 0900  WBC 10.8*  HGB 14.1  HCT 41.2  PLT 312   BMET  Recent Labs  07/28/13 0900  NA 143  K 4.0  CL 98  CO2 30  GLUCOSE 181*  BUN 30*  CREATININE 0.71  CALCIUM 10.2   LFT  Recent Labs  07/28/13 0900  PROT 7.9  ALBUMIN 4.2  AST 32  ALT 18  ALKPHOS 99  BILITOT 0.9   PT/INR  Recent Labs  07/28/13 0900  LABPROT 14.4  INR 1.14   Studies/Results: Dg Chest Port 1 View  07/28/2013   CLINICAL DATA:  72 year old female with chest pain nausea and vomiting. Initial encounter.  EXAM: PORTABLE CHEST - 1 VIEW  COMPARISON:  12/11/2011 and earlier.  FINDINGS: Portable AP semi upright view at 0914 hrs. Large hiatal hernia. Stable lung volumes. Other mediastinal contours are within normal limits. Visualized tracheal air column is within normal limits. Allowing for portable technique, the lungs are clear. No pneumothorax. No pneumoperitoneum identified. Left upper quadrant surgical clips.  IMPRESSION: No acute cardiopulmonary abnormality.  Large hiatal hernia.   Electronically Signed   By: Augusto Gamble M.D.   On: 07/28/2013 09:27    IMPRESSION:  -UGIB:  Rule out ulcer disease, Camerson's lesions/ulcers from large hiatal hernia, erosive esophagitis, MWT, etc. -Large hiatal hernia  PLAN: -EGD today. -Monitor Hgb. -Continue PPI gtt for now. -Anti-emetics and IVF's.   ZEHR, JESSICA D.  07/28/2013, 10:26 AM  Pager number [phone]     ________________________________________________________________________  [facility] MD note:  I personally examined the patient, reviewed the data and agree with the  assessment and plan described above.  EGD this afternoon.   Rob Bunting, MD [facility] Pager [phone]

## 2013-07-28 NOTE — Interval H&P Note (Signed)
History and Physical Interval Note:  07/28/2013 4:51 PM  [name]  has presented today for surgery, with the diagnosis of bowel obstruction, inability to pass og or ng tube  The various methods of treatment have been discussed with the patient and family. After consideration of risks, benefits and other options for treatment, the patient has consented to  Procedure(s): ESOPHAGOGASTRODUODENOSCOPY (EGD) (N/A) as a surgical intervention .  The patient's history has been reviewed, patient examined, no change in status, stable for surgery.  I have reviewed the patient's chart and labs.  Questions were answered to the patient's satisfaction.     Rachael Fee

## 2013-07-28 NOTE — ED Notes (Signed)
MD at bedside. 

## 2013-07-29 ENCOUNTER — Inpatient Hospital Stay (HOSPITAL_COMMUNITY): Payer: Medicare Other

## 2013-07-29 ENCOUNTER — Encounter (HOSPITAL_COMMUNITY): Payer: Self-pay | Admitting: Gastroenterology

## 2013-07-29 DIAGNOSIS — R0902 Hypoxemia: Secondary | ICD-10-CM

## 2013-07-29 DIAGNOSIS — K922 Gastrointestinal hemorrhage, unspecified: Secondary | ICD-10-CM

## 2013-07-29 DIAGNOSIS — J9600 Acute respiratory failure, unspecified whether with hypoxia or hypercapnia: Secondary | ICD-10-CM

## 2013-07-29 DIAGNOSIS — K449 Diaphragmatic hernia without obstruction or gangrene: Secondary | ICD-10-CM

## 2013-07-29 DIAGNOSIS — E46 Unspecified protein-calorie malnutrition: Secondary | ICD-10-CM

## 2013-07-29 LAB — GLUCOSE, CAPILLARY
Glucose-Capillary: 167 mg/dL — ABNORMAL HIGH (ref 70–99)
Glucose-Capillary: 117 mg/dL — ABNORMAL HIGH (ref 70–99)
Glucose-Capillary: 150 mg/dL — ABNORMAL HIGH (ref 70–99)

## 2013-07-29 LAB — BASIC METABOLIC PANEL
BUN: 37 mg/dL — ABNORMAL HIGH (ref 6–23)
CO2: 28 mEq/L (ref 19–32)
Creatinine, Ser: 0.91 mg/dL (ref 0.50–1.10)
GFR calc Af Amer: 71 mL/min — ABNORMAL LOW (ref >90)
GFR calc non Af Amer: 62 mL/min — ABNORMAL LOW (ref >90)
Potassium: 3.5 mEq/L (ref 3.5–5.1)
Sodium: 143 mEq/L (ref 135–145)

## 2013-07-29 LAB — CBC
HCT: 29.8 % — ABNORMAL LOW (ref 36.0–46.0)
Hemoglobin: 10 g/dL — ABNORMAL LOW (ref 12.0–15.0)
MCHC: 33.6 g/dL (ref 30.0–36.0)
MCV: 90.6 fL (ref 78.0–100.0)
RBC: 3.29 MIL/uL — ABNORMAL LOW (ref 3.87–5.11)
WBC: 6.8 10*3/uL (ref 4.0–10.5)

## 2013-07-29 LAB — HEMOGLOBIN A1C: Mean Plasma Glucose: 120 mg/dL — ABNORMAL HIGH (ref <117)

## 2013-07-29 MED ORDER — FAT EMULSION 20 % IV EMUL
250.0000 mL | INTRAVENOUS | Status: AC
  Administered 2013-07-29: 250 mL via INTRAVENOUS
  Administered 2013-07-30: 4 mL via INTRAVENOUS
  Filled 2013-07-29: qty 250

## 2013-07-29 MED ORDER — IOHEXOL 300 MG/ML  SOLN
80.0000 mL | Freq: Once | INTRAMUSCULAR | Status: AC | PRN
  Administered 2013-07-29: 80 mL via INTRAVENOUS

## 2013-07-29 MED ORDER — IOHEXOL 300 MG/ML  SOLN
25.0000 mL | INTRAMUSCULAR | Status: AC
  Administered 2013-07-29: 25 mL via ORAL

## 2013-07-29 MED ORDER — INSULIN ASPART 100 UNIT/ML ~~LOC~~ SOLN
0.0000 [IU] | SUBCUTANEOUS | Status: DC
  Administered 2013-07-29: 3 [IU] via SUBCUTANEOUS
  Administered 2013-07-29: 2 [IU] via SUBCUTANEOUS
  Administered 2013-07-29: 3 [IU] via SUBCUTANEOUS
  Administered 2013-07-30: 2 [IU] via SUBCUTANEOUS
  Administered 2013-07-30: 3 [IU] via SUBCUTANEOUS
  Administered 2013-07-30 – 2013-08-06 (×18): 2 [IU] via SUBCUTANEOUS
  Administered 2013-08-07: 3 [IU] via SUBCUTANEOUS
  Administered 2013-08-07 – 2013-08-12 (×9): 2 [IU] via SUBCUTANEOUS

## 2013-07-29 MED ORDER — SODIUM CHLORIDE 0.9 % IJ SOLN
10.0000 mL | INTRAMUSCULAR | Status: DC | PRN
  Administered 2013-08-03 – 2013-08-09 (×11): 10 mL
  Administered 2013-08-09: 20 mL
  Administered 2013-08-12: 04:00:00 10 mL
  Administered 2013-08-13 (×2): 20 mL
  Administered 2013-08-14 – 2013-08-15 (×4): 10 mL

## 2013-07-29 MED ORDER — TRACE MINERALS CR-CU-F-FE-I-MN-MO-SE-ZN IV SOLN
INTRAVENOUS | Status: AC
  Administered 2013-07-29: 18:00:00 via INTRAVENOUS
  Filled 2013-07-29: qty 1000

## 2013-07-29 MED ORDER — SODIUM CHLORIDE 0.9 % IJ SOLN
10.0000 mL | Freq: Two times a day (BID) | INTRAMUSCULAR | Status: DC
  Administered 2013-07-29 (×2): 10 mL
  Administered 2013-07-30: 20 mL
  Administered 2013-07-31 – 2013-08-03 (×5): 10 mL
  Administered 2013-08-03: 22:00:00 12 mL
  Administered 2013-08-04: 40 mL
  Administered 2013-08-07 – 2013-08-12 (×4): 10 mL

## 2013-07-29 NOTE — Progress Notes (Signed)
eLink Nursing ICU Electrolyte Replacement Protocol  Patient Name: [name] DOB: 08-03-1941 MRN: [id number]  Date of Service  07/29/2013   HPI/Events of Note    Recent Labs Lab 07/28/13 0900 07/29/13 0320  NA 143 143  K 4.0 3.5  CL 98 106  CO2 30 28  GLUCOSE 181* 208*  BUN 30* 37*  CREATININE 0.71 0.91  CALCIUM 10.2 8.4    Estimated Creatinine Clearance: 33.2 ml/min (by C-G formula based on Cr of 0.91).  Intake/Output     11/18 0701 - 11/19 0700   I.V. (mL/kg) 1582 (36.9)   Total Intake(mL/kg) 1582 (36.9)   Urine (mL/kg/hr) 910   Emesis/NG output 450   Total Output 1360   Net +222        - I/O DETAILED x24h    Total I/O In: 987 [I.V.:987] Out: 460 [Urine:460] - I/O THIS SHIFT    ASSESSMENT   eICURN Interventions  K+ 3.5 IVF contains K+.Electrolyte protocol criteria no met. K+   NOT Replace per protocol.  MD notified   ASSESSMENT: MAJOR ELECTROLYTE    [name] 07/29/2013, 6:42 AM

## 2013-07-29 NOTE — Progress Notes (Signed)
CARE MANAGEMENT NOTE 07/29/2013  Patient:  [name], [name]   Account Number:  [id number]  Date Initiated:  07/29/2013  Documentation initiated by:  DAVIS,RHONDA  Subjective/Objective Assessment:   pt with active emesis and intractable, intubation perform for airway protection     Action/Plan:   lives alone does have children in the area   Anticipated DC Date:  08/01/2013   Anticipated DC Plan:  HOME/SELF CARE  In-house referral  NA      DC Planning Services  NA      [facility] Choice  NA   Choice offered to / List presented to:  NA   DME arranged  NA      DME agency  NA     HH arranged  NA      HH agency  NA   Status of service:  In process, will continue to follow Medicare Important Message given?  NA - LOS <3 / Initial given by admissions (If response is "NO", the following Medicare IM given date fields will be blank) Date Medicare IM given:   Date Additional Medicare IM given:    Discharge Disposition:    Per UR Regulation:  Reviewed for med. necessity/level of care/duration of stay  If discussed at Long Length of Stay Meetings, dates discussed:    Comments:  [id number]/Rhonda Lorrin Mais: Case management [phone] Chart reviewed and updated.  Next chart review due on [id number]. Needs for discharge at time of review:  None

## 2013-07-29 NOTE — Progress Notes (Signed)
EXAM:  CT CHEST, ABDOMEN, AND PELVIS WITH CONTRAST  TECHNIQUE:  Multidetector CT imaging of the chest, abdomen and pelvis was  performed following the standard protocol during bolus  administration of intravenous contrast.  CONTRAST: 80mL OMNIPAQUE IOHEXOL 300 MG/ML SOLN  COMPARISON: CT of the abdomen and pelvis 02/12/2008.  FINDINGS:  CT CHEST FINDINGS  Mediastinum: Large hiatal hernia again noted. No evidence gastric  volvulus at this time. Nasogastric tube extends into the distal  stomach. The patient is intubated, with the tip of the endotracheal  tube at the level the carina directed toward the right mainstem  bronchus. Heart size is normal. There is no significant pericardial  fluid, thickening or pericardial calcification. No pathologically  enlarged mediastinal or hilar lymph nodes.  Lungs/Pleura: Trace right and small left pleural effusions layering  dependently are simple in appearance. No consolidative airspace  disease. No pleural effusions. Areas of mild dependent atelectasis  are noted throughout the lower lobes of the lungs bilaterally. No  definite suspicious appearing pulmonary nodules or masses.  Musculoskeletal: Severe levoscoliosis of the thoracolumbar spine  convex to the left at the thoracolumbar junction. There are no  aggressive appearing lytic or blastic lesions noted in the  visualized portions of the skeleton.  CT ABDOMEN AND PELVIS FINDINGS  Abdomen/Pelvis: The gallbladder appears moderately distended, and  there is a small amount of pericholecystic fluid best demonstrated  on image 67 of series 2. However, the gallbladder wall does not  appear thickened, and there are no definite high attenuation  gallstones identified at this time. Common bile duct is normal in  caliber. Pancreatic duct is not dilated. No intrahepatic biliary  ductal dilatation. The appearance of the liver, pancreas, spleen,  left adrenal gland and right kidney are unremarkable. The  patient's  right adrenal gland cannot be confidently identified secondary to  her altered anatomy. Sub-cm low-attenuation lesion in the interpolar  region of the left kidney is too small to definitively characterize,  but is statistically likely a tiny cyst.  No significant volume of ascites. No pneumoperitoneum. No pathologic  distention of small bowel. No definite lymphadenopathy is  confidently identified within the abdomen or pelvis. Mild  atherosclerosis throughout the abdominal and pelvic vasculature  without definite aneurysm or dissection. Surgical clips around the  esophageal hiatus just beneath the diaphragm. Foley balloon catheter  with tip in the lumen of the urinary bladder. Nondependent gas in  the lumen of the urinary bladder is presumably iatrogenic. Urinary  bladder is otherwise unremarkable in appearance. Status post  hysterectomy. Ovaries are not confidently identified may be  surgically absent or atrophic.  Musculoskeletal: Severe levoscoliosis of the thoracolumbar spine  redemonstrated, as discussed above. There are no aggressive  appearing lytic or blastic lesions noted in the visualized portions  of the skeleton.  IMPRESSION:  1. There is a large hiatal hernia, but there are no current findings  to suggest gastric volvulus.  2. Low lying endotracheal tube. Tip of the endotracheal tube is  currently at the carina directed toward the right mainstem bronchus.  This could be withdrawn 4-5 cm for more optimal placement.  3. Mild distension of the gallbladder with some pericholecystic  fluid. Despite this, there are no definite gallstones, and there is  no definite gallbladder wall thickening. These findings are of  uncertain etiology and significance, but if there is clinical  concern for cholecystitis related to non radiopaque stones or  acalculus cholecystitis, further evaluation with ultrasound  examination may provide additional  diagnostic information if   clinically indicated.  4. Small left and trace right-sided pleural effusions are simple in  appearance and layer dependently.  5. Severe levoscoliosis of the thoracolumbar spine.    I reviewed the CT scan findings with the radiologist. I have discussed this with the family as well as Dr. Christella Hartigan and Dr. Sung Amabile.  She does not have a gastric volvulus or a complete gastric outlet obstruction, but the stomach is certainly compressing the duodenum significantly. She may also have a significant element of gastroparesis. I recommend open repair of the hiatal hernia, possibly using mesh, and placement of a gastrostomy-jejunostomy tube. I have discussed this extensively with her family. We went over the procedure and the risks.   Risks include but are not limited to bleeding, infection, wound problems, anesthesia, dysphagia, injury to esophagus/stomach/liver/spleen/intestine,gas bloat and failure of gastric emptying, life-long dependence on tube feedings, death.  They seem to understand all of this and would like to proceed. We'll work on getting it scheduled it for tomorrow or Friday.

## 2013-07-29 NOTE — Progress Notes (Signed)
Unable to flush air or pull back on OGT. PCCM aware and after viewing KUB, order to pull back on OGT 1-2 inches. OGT coiled in mouth and pulled back 1-2 inches and re taped. Verified placement and bilious secretions pulled back from OGT to LIWS. After administering contrast for CT, pt coughing and contrast solution coming out of mouth. Re verified OGT placement and bilious secretions continuing to come out. Notified PCCM, CCS and GI MDs. Patient could have possibly bitten OGT during night. No tachypnea and oxygen maintained 100% on ventilator during entire episode. Subglottic suction in place on ETT to aid in removing oral and airway secretions.

## 2013-07-29 NOTE — Progress Notes (Signed)
[facility] Progress Note  Subjective:  Was intubated after the EGD yesterday for concern of gastric volvulus and to protect airway.  Awaiting CT scan.  Objective:  Vital signs in last 24 hours: Temp:  [97.5 F (36.4 C)-99.2 F (37.3 C)] 97.5 F (36.4 C) (11/19 0400) Pulse Rate:  [75-116] 75 (11/19 0600) Resp:  [13-36] 14 (11/19 0600) BP: (81-182)/(49-124) 81/55 mmHg (11/19 0600) SpO2:  [93 %-100 %] 100 % (11/19 0600) FiO2 (%):  [40 %-100 %] 40 % (11/19 0427) Weight:  [94 lb 9.2 oz (42.9 kg)] 94 lb 9.2 oz (42.9 kg) (11/18 1700)   General:  Intubated and sedated. Heart:  Regular rate and rhythm; no murmurs Pulm:  Decreased breath sounds. Abdomen:  Soft, non-distended.  BS present.  Non-tender. Extremities:  Without edema.  Intake/Output from previous day: 11/18 0701 - 11/19 0700 In: 1582 [I.V.:1582] Out: 1360 [Urine:910; Emesis/NG output:450]  Lab Results:  Recent Labs  07/28/13 0900 07/29/13 0320  WBC 10.8* 6.8  HGB 14.1 10.0*  HCT 41.2 29.8*  PLT 312 205   BMET  Recent Labs  07/28/13 0900 07/29/13 0320  NA 143 143  K 4.0 3.5  CL 98 106  CO2 30 28  GLUCOSE 181* 208*  BUN 30* 37*  CREATININE 0.71 0.91  CALCIUM 10.2 8.4   LFT  Recent Labs  07/28/13 0900  PROT 7.9  ALBUMIN 4.2  AST 32  ALT 18  ALKPHOS 99  BILITOT 0.9   PT/INR  Recent Labs  07/28/13 0900  LABPROT 14.4  INR 1.14   Dg Abd 1 View  07/28/2013   CLINICAL DATA:  Orogastric tube placement.  EXAM: ABDOMEN - 1 VIEW  COMPARISON:  Abdominal radiograph July 28, 2013 at 1453 hr  FINDINGS: Nasogastric tube in the region of the mid stomach. Due to severe thoracolumbar scoliosis, exact placement of the tube may be difficult ascertain. Surgical clips about the gastroesophageal junction region.  1 cm calcification in left abdomen may reflect nephrolithiasis. Moderate amount of retained large bowel stool with oral nonspecific bowel gas pattern. Patient's known hiatal hernia is  better appreciated on prior examination.  IMPRESSION: Nasogastric tube tip projecting in region of mid stomach.  Nonspecific bowel gas pattern with moderate amount of retained large bowel stool.   Electronically Signed   By: Awilda Metro   On: 07/28/2013 22:19   Dg Chest Port 1 View  07/29/2013   CLINICAL DATA:  Respiratory failure  EXAM: PORTABLE CHEST - 1 VIEW  COMPARISON:  Chest x-ray from yesterday  FINDINGS: Endotracheal tube ends 1 cm above the carina. Gastric suction tube is within a large hiatal hernia, which has been partly decompressed. Probable cardiomegaly, although distorted by rightward rotation.  No visible edema or consolidation. No pneumothorax or effusion. Levoscoliosis of the thoracolumbar spine.  IMPRESSION: 1. Low endotracheal tube, ending 1 cm above the carina. 2. Large hiatal hernia with indwelling orogastric tube. 3. Low volume lungs without edema or consolidation.   Electronically Signed   By: Tiburcio Pea M.D.   On: 07/29/2013 07:00   Dg Chest Port 1 View  07/28/2013   CLINICAL DATA:  ET tube placement.  EXAM: PORTABLE CHEST - 1 VIEW  COMPARISON:  07/28/2013  FINDINGS: There is an endotracheal tube with the tip at the proximal most portion of the right mainstem bronchus. There is no focal parenchymal opacity. There is no pleural effusion or pneumothorax. Stable heart mediastinum. Large hiatal hernia. The osseous structures are unremarkable.  IMPRESSION: There  is an endotracheal tube with the tip at the proximal most portion of the right mainstem bronchus. The endotracheal tube has already been retracted 3 cm by the time of this dictation. These results were called by telephone at the time of interpretation on 07/28/2013 at 4:12 PM to Rennis Petty, RN, who verbally acknowledged these results.   Electronically Signed   By: Elige Ko   On: 07/28/2013 16:13   Dg Chest Port 1 View  07/28/2013   CLINICAL DATA:  72 year old female with chest pain nausea and vomiting. Initial  encounter.  EXAM: PORTABLE CHEST - 1 VIEW  COMPARISON:  12/11/2011 and earlier.  FINDINGS: Portable AP semi upright view at 0914 hrs. Large hiatal hernia. Stable lung volumes. Other mediastinal contours are within normal limits. Visualized tracheal air column is within normal limits. Allowing for portable technique, the lungs are clear. No pneumothorax. No pneumoperitoneum identified. Left upper quadrant surgical clips.  IMPRESSION: No acute cardiopulmonary abnormality.  Large hiatal hernia.   Electronically Signed   By: Augusto Gamble M.D.   On: 07/28/2013 09:27   Dg Abd 2 Views  07/28/2013   CLINICAL DATA:  Vomiting, known hiatal hernia, findings worrisome for gastric volvulus on endoscopy  EXAM: ABDOMEN - 2 VIEW  COMPARISON:  CT abdomen pelvis dated 02/12/2008  FINDINGS: Nonspecific bowel gas pattern without findings suspicious for obstruction.  No evidence of free air on the lateral decubitus view.  Large hiatal hernia with air-fluid level on the decubitus view.  Severe thoracolumbar scoliosis.  When correlating with the prior CT, there is a large hiatal hernia with inverted intrathoracic stomach (greater curvature flipped superiorly) on that study.  IMPRESSION: No findings to suggest bowel obstruction or free air.  Large hiatal hernia with air-fluid level on the decubitus view.  Prior CT demonstrates a large hiatal hernia with inverted intrathoracic stomach. If there is clinical concern for superimposed acute gastric volvulus, consider CT.  These results were called by telephone at the time of interpretation on 07/28/2013 at 3:20 PM to Dr. Wendall Papa, who verbally acknowledged these results.   Electronically Signed   By: Charline Bills M.D.   On: 07/28/2013 15:23    Assessment / Plan: -Concern for gastric volvulus vs other process such as GOO or SBO.  She does have a large hiatal hernia.  EGD 11/18 showed feculent material in esophagus as well as distorted anatomy and gastric edema.  Surgery is  following. -Acute drop in Hgb:  Likely somewhat dilutional but ? some component of a GIB.   *Await CT scans to better assess anatomy and clinical picture.   *Continue protonix IV daily. *Monitor Hgb and transfuse prn.    LOS: 1 day   ZEHR, JESSICA D.  07/29/2013, 8:52 AM  Pager number [phone]   ________________________________________________________________________  [facility] MD note:  I personally examined the patient, reviewed the data and agree with the assessment and plan described above.  Dr. Abbey Chatters is planning surgery to pull stomach into abd, pexy it into place, close diaphragm defect if possible.  For now, OG tube in place, working well.   Rob Bunting, MD [facility] Pager [phone]

## 2013-07-29 NOTE — Progress Notes (Signed)
Met daughter in hallway. She was tearful and worried about her mother. Her husband arrived and they requested prayer, prayed. Presence; listening; comfort.

## 2013-07-29 NOTE — Progress Notes (Signed)
1 Day Post-Op  Subjective: Awake.  Wants ETT out.  Objective: Vital signs in last 24 hours: Temp:  [97.5 F (36.4 C)-99.2 F (37.3 C)] 97.5 F (36.4 C) (11/19 0400) Pulse Rate:  [75-127] 75 (11/19 0600) Resp:  [13-36] 14 (11/19 0600) BP: (81-182)/(49-124) 81/55 mmHg (11/19 0600) SpO2:  [93 %-100 %] 100 % (11/19 0600) FiO2 (%):  [40 %-100 %] 40 % (11/19 0427) Weight:  [94 lb 9.2 oz (42.9 kg)] 94 lb 9.2 oz (42.9 kg) (11/18 1700)    Intake/Output from previous day: 11/18 0701 - 11/19 0700 In: 1582 [I.V.:1582] Out: 1360 [Urine:910; Emesis/NG output:450] Intake/Output this shift:    PE: General- In NAD Abdomen-soft, non-tender  Lab Results:   Recent Labs  07/28/13 0900 07/29/13 0320  WBC 10.8* 6.8  HGB 14.1 10.0*  HCT 41.2 29.8*  PLT 312 205   BMET  Recent Labs  07/28/13 0900 07/29/13 0320  NA 143 143  K 4.0 3.5  CL 98 106  CO2 30 28  GLUCOSE 181* 208*  BUN 30* 37*  CREATININE 0.71 0.91  CALCIUM 10.2 8.4   PT/INR  Recent Labs  07/28/13 0900  LABPROT 14.4  INR 1.14   Comprehensive Metabolic Panel:    Component Value Date/Time   NA 143 07/29/2013 0320   K 3.5 07/29/2013 0320   CL 106 07/29/2013 0320   CO2 28 07/29/2013 0320   BUN 37* 07/29/2013 0320   CREATININE 0.91 07/29/2013 0320   GLUCOSE 208* 07/29/2013 0320   CALCIUM 8.4 07/29/2013 0320   AST 32 07/28/2013 0900   ALT 18 07/28/2013 0900   ALKPHOS 99 07/28/2013 0900   BILITOT 0.9 07/28/2013 0900   PROT 7.9 07/28/2013 0900   ALBUMIN 4.2 07/28/2013 0900     Studies/Results: Dg Abd 1 View  07/28/2013   CLINICAL DATA:  Orogastric tube placement.  EXAM: ABDOMEN - 1 VIEW  COMPARISON:  Abdominal radiograph July 28, 2013 at 1453 hr  FINDINGS: Nasogastric tube in the region of the mid stomach. Due to severe thoracolumbar scoliosis, exact placement of the tube may be difficult ascertain. Surgical clips about the gastroesophageal junction region.  1 cm calcification in left abdomen may  reflect nephrolithiasis. Moderate amount of retained large bowel stool with oral nonspecific bowel gas pattern. Patient's known hiatal hernia is better appreciated on prior examination.  IMPRESSION: Nasogastric tube tip projecting in region of mid stomach.  Nonspecific bowel gas pattern with moderate amount of retained large bowel stool.   Electronically Signed   By: Awilda Metro   On: 07/28/2013 22:19   Dg Chest Port 1 View  07/29/2013   CLINICAL DATA:  Respiratory failure  EXAM: PORTABLE CHEST - 1 VIEW  COMPARISON:  Chest x-ray from yesterday  FINDINGS: Endotracheal tube ends 1 cm above the carina. Gastric suction tube is within a large hiatal hernia, which has been partly decompressed. Probable cardiomegaly, although distorted by rightward rotation.  No visible edema or consolidation. No pneumothorax or effusion. Levoscoliosis of the thoracolumbar spine.  IMPRESSION: 1. Low endotracheal tube, ending 1 cm above the carina. 2. Large hiatal hernia with indwelling orogastric tube. 3. Low volume lungs without edema or consolidation.   Electronically Signed   By: Tiburcio Pea M.D.   On: 07/29/2013 07:00   Dg Chest Port 1 View  07/28/2013   CLINICAL DATA:  ET tube placement.  EXAM: PORTABLE CHEST - 1 VIEW  COMPARISON:  07/28/2013  FINDINGS: There is an endotracheal tube with the tip at  the proximal most portion of the right mainstem bronchus. There is no focal parenchymal opacity. There is no pleural effusion or pneumothorax. Stable heart mediastinum. Large hiatal hernia. The osseous structures are unremarkable.  IMPRESSION: There is an endotracheal tube with the tip at the proximal most portion of the right mainstem bronchus. The endotracheal tube has already been retracted 3 cm by the time of this dictation. These results were called by telephone at the time of interpretation on 07/28/2013 at 4:12 PM to Rennis Petty, RN, who verbally acknowledged these results.   Electronically Signed   By: Elige Ko    On: 07/28/2013 16:13   Dg Chest Port 1 View  07/28/2013   CLINICAL DATA:  72 year old female with chest pain nausea and vomiting. Initial encounter.  EXAM: PORTABLE CHEST - 1 VIEW  COMPARISON:  12/11/2011 and earlier.  FINDINGS: Portable AP semi upright view at 0914 hrs. Large hiatal hernia. Stable lung volumes. Other mediastinal contours are within normal limits. Visualized tracheal air column is within normal limits. Allowing for portable technique, the lungs are clear. No pneumothorax. No pneumoperitoneum identified. Left upper quadrant surgical clips.  IMPRESSION: No acute cardiopulmonary abnormality.  Large hiatal hernia.   Electronically Signed   By: Augusto Gamble M.D.   On: 07/28/2013 09:27   Dg Abd 2 Views  07/28/2013   CLINICAL DATA:  Vomiting, known hiatal hernia, findings worrisome for gastric volvulus on endoscopy  EXAM: ABDOMEN - 2 VIEW  COMPARISON:  CT abdomen pelvis dated 02/12/2008  FINDINGS: Nonspecific bowel gas pattern without findings suspicious for obstruction.  No evidence of free air on the lateral decubitus view.  Large hiatal hernia with air-fluid level on the decubitus view.  Severe thoracolumbar scoliosis.  When correlating with the prior CT, there is a large hiatal hernia with inverted intrathoracic stomach (greater curvature flipped superiorly) on that study.  IMPRESSION: No findings to suggest bowel obstruction or free air.  Large hiatal hernia with air-fluid level on the decubitus view.  Prior CT demonstrates a large hiatal hernia with inverted intrathoracic stomach. If there is clinical concern for superimposed acute gastric volvulus, consider CT.  These results were called by telephone at the time of interpretation on 07/28/2013 at 3:20 PM to Dr. Wendall Papa, who verbally acknowledged these results.   Electronically Signed   By: Charline Bills M.D.   On: 07/28/2013 15:23    Anti-infectives: Anti-infectives   None      Assessment Active Problems:   GI bleed    Gastric outlet obstruction vs sbo   Acute respiratory failure with hypoxia    LOS: 1 day   Plan: CT scan of chest/abdomen and pelvis today to define process better.   Rogenia Werntz J 07/29/2013

## 2013-07-29 NOTE — Progress Notes (Addendum)
PULMONARY  / CRITICAL CARE MEDICINE  Name: [name] MRN: [id number] DOB: [birth year]    ADMISSION DATE:  07/28/2013 CONSULTATION DATE:  11/19  REFERRING MD :  Christella Hartigan PRIMARY SERVICE: [facility] >> PCCM  CHIEF COMPLAINT: intractable vomiting. Intubated to protect airwaY  BRIEF PATIENT DESCRIPTION:  74 F with large HH adm 11/18 by [facility] with intractable vomiting and concern for UGIB. Underwent EGD 11/19 revealing possible gastric volvulus. Intubated after EGD to protect airway and permit completion of eval.   SIGNIFICANT EVENTS / STUDIES:  11/18 EGD: stomach was filled with solid brown, feculent appearing material. The gastric anatomy was very abnormal, distorted and twisted 11/18 Intubated electively and prophylactically to prevent aspiration 11/18 CCS consult (Rosenbower): decompress stomach with OGT. CT chest/abdomen/pelvis ordered 11/19 CT chest/abd/pelvis: There is a large hiatal hernia, but there are no current findings to suggest gastric volvulus. Low lying endotracheal tube. Tip of the endotracheal tube is currently at the carina directed toward the right mainstem bronchus. This could be withdrawn 4-5 cm for more optimal placement. Mild distension of the gallbladder with some pericholecystic fluid. Despite this, there are no definite gallstones, and there is no definite gallbladder wall thickening. These findings are of uncertain etiology and significance, but if there is clinical concern for cholecystitis related to non radiopaque stones or acalculus cholecystitis, further evaluation with ultrasound examination may provide additional diagnostic information if clinically indicated. Small left and trace right-sided pleural effusions are simple in appearance and layer dependently. Severe levoscoliosis of the thoracolumbar spine 11/19 TPN started   LINES / TUBES: ETT 11/18 >>  PICC 11/19 >>    CULTURES:   ANTIBIOTICS:   HISTORY OF PRESENT ILLNESS:   Sedated on vent. Not  F/C   SUBJECTIVE:  Sedated on vent VITAL SIGNS: Temp:  [97.5 F (36.4 C)-99.2 F (37.3 C)] 97.5 F (36.4 C) (11/19 0400) Pulse Rate:  [75-127] 75 (11/19 0600) Resp:  [13-36] 14 (11/19 0600) BP: (81-182)/(49-124) 81/55 mmHg (11/19 0600) SpO2:  [93 %-100 %] 100 % (11/19 0600) FiO2 (%):  [40 %-100 %] 40 % (11/19 0427) Weight:  [94 lb 9.2 oz (42.9 kg)] 94 lb 9.2 oz (42.9 kg) (11/18 1700) HEMODYNAMICS:   VENTILATOR SETTINGS: Vent Mode:  [-] PRVC FiO2 (%):  [40 %-100 %] 40 % Set Rate:  [14 bmp] 14 bmp Vt Set:  [400 mL] 400 mL PEEP:  [5 cmH20] 5 cmH20 Pressure Support:  [5 cmH20] 5 cmH20 Plateau Pressure:  [15 cmH20-16 cmH20] 16 cmH20 INTAKE / OUTPUT: Intake/Output     11/18 0701 - 11/19 0700 11/19 0701 - 11/20 0700   I.V. (mL/kg) 1582 (36.9)    Total Intake(mL/kg) 1582 (36.9)    Urine (mL/kg/hr) 910    Emesis/NG output 450    Total Output 1360     Net +222            PHYSICAL EXAMINATION: General:  Sedated, NAD Neuro:  RASS -3, not F/C, MAEs, no focal deficits, on precedex drip HEENT: poor dentition, feculent staining of oropharynx Cardiovascular:  RRR s M Lungs: severe scoliosis, clear anteriorly Abdomen:  Scaphoid, soft, NT, no HSM, diminished to absent BS Ext: warm, no edema  LABS: Reviewed  CXR:  Large HH, no infiltrates, no change 11-19, et 1 cm above carina  ASSESSMENT / PLAN:  PULMONARY A: Acute respiratory failure Compromised airway protection and intractable vomiting P:   Vent settings established Vent bundle implemented Daily SBT as indicated Will leave intubated in anticipation of laparotomy planned  11/21  CARDIOVASCULAR A:  Hypotension, ? Related to sedation.  P:  Monitor  RENAL A:   No issues P:   Monitor BMET intermittently Correct electrolytes as indicated IVFs adjusted  GASTROINTESTINAL A:   Large HH Gastric outlet obstruction Protein-calorie malnutrition P:   SUP: IV PPI Further eval and mgmt per GI and CCS Begin TPN  11/19  HEMATOLOGIC A:   No issues P:  DVT px: SCDs Monitor CBC intermittently Consider medical DVT px post operatively   INFECTIOUS A:   No issues P:   Monitor  ENDOCRINE A:   Hyperglycemia without hx of DM P:   Monitor glu on chem panels SSI for glu > 180   NEUROLOGIC A:   Acute encephalopathy after EGD P:   Cont precedex and intermittent fentanyl Daily WUA  TODAY'S SUMMARY:    Brett Canales Minor ACNP [facility] PCCM Pager [phone] till 3 pm If no answer page [phone] 07/29/2013, 8:29 AM   PCCM ATTENDING: I have interviewed and examined the patient and reviewed the database. I have formulated the assessment and plan as reflected in the note above with amendments made by me. 40 mins CCM time  Discussed with Dr Abbey Chatters and have reviewed CT abdomen. Will begin TPN. Plan laparotomy 11/21 per Dr Abbey Chatters. Family updated @ bedside  Billy Fischer, MD;  PCCM service; [place] [phone]

## 2013-07-29 NOTE — Progress Notes (Signed)
Peripherally Inserted Central Catheter/Midline Placement  The IV Nurse has discussed with the patient and/or persons authorized to consent for the patient, the purpose of this procedure and the potential benefits and risks involved with this procedure.  The benefits include less needle sticks, lab draws from the catheter and patient may be discharged home with the catheter.  Risks include, but not limited to, infection, bleeding, blood clot (thrombus formation), and puncture of an artery; nerve damage and irregular heat beat.  Alternatives to this procedure were also discussed.  PICC/Midline Placement Documentation    Information given to daughter.    Franne Grip Renee 07/29/2013, 2:43 PM

## 2013-07-29 NOTE — Progress Notes (Signed)
INITIAL NUTRITION ASSESSMENT  DOCUMENTATION CODES Per approved criteria  -Not Applicable   INTERVENTION: - TPN per pharmacy - Will continue to monitor   NUTRITION DIAGNOSIS: Inadequate oral intake related to inability to eat/mechanical ventilation as evidenced by NPO.    Goal: TPN to meet >90% of estimated nutritional needs  Monitor:  Weights, labs, TPN, extubation, NGT output   Reason for Assessment: Ventilated   72 y.o. female  Admitting Dx: Nausea, GI bleed  ASSESSMENT: Admitted from home with c/o nausea, vomiting, and coffee ground emesis that stated Saturday PTA. Hx of large hiatal hernia, GI bleeding, diverticulosis, GERD, IBS, HTN, and nissen fundoplication. On Creon with meals, vitamin D, and Benefiber at home. Found to have upper GI bleed. GI following and noted pt with emesis too numerous to count 11/17. Surgery following and noted per daughter's report, pt with GI problems for 20 years. Daughter reported pt never ate very much but increased portion sizes would make reflux/vomiting worse.   S/p EGD yesterday which showed feculent liquid material in esophagus, oropharynx and pt was intubated for airway protection. MD noted concern about gastric volvulus. Had 2nd EGD yesterday that showed "The stomach was filled with solid brown, feculent appearing material. The gastric anatomy was very abnormal, distorted and twisted. OGT was pulled into stomach."  Met with daughter outside of pt's room who reports pt typically eats 3 meals/day plus snacks, not on a set schedule. She reports pt thinks she lost 4 pounds recently due to nausea/vomiting, per weight trend this weight has been lost in the past month. Pt was drinking Boost PRN. Daughter reports pt was taking Benefiber daily and Creon every time she ate. Per conversation with RN, no plans for extubation or TF today. Noted plans to start TPN. Pt with NGT in place draining bile.    Patient is currently intubated on ventilator support.   MV: 5.7 L/min Temp:Temp (24hrs), Avg:98.3 F (36.8 C), Min:97.5 F (36.4 C), Max:99.2 F (37.3 C)  Propofol: Off  Height: Ht Readings from Last 1 Encounters:  07/28/13 4\' 7"  (1.397 m)    Weight: Wt Readings from Last 1 Encounters:  07/28/13 94 lb 9.2 oz (42.9 kg)    Ideal Body Weight: 92 lb  % Ideal Body Weight: 102%  Wt Readings from Last 10 Encounters:  07/28/13 94 lb 9.2 oz (42.9 kg)  07/28/13 94 lb 9.2 oz (42.9 kg)  07/28/13 94 lb 9.2 oz (42.9 kg)  06/10/13 98 lb (44.453 kg)  03/18/13 95 lb (43.092 kg)  09/25/12 99 lb 12.8 oz (45.269 kg)  06/24/12 95 lb (43.092 kg)  03/25/12 100 lb (45.36 kg)  01/02/12 99 lb 6.4 oz (45.088 kg)  12/25/11 103 lb (46.72 kg)    Usual Body Weight: 98 lb per daughter  % Usual Body Weight: 96%  BMI:  Body mass index is 21.98 kg/(m^2).  Estimated Nutritional Needs: Kcal: 1000 Protein: 60-70g Fluid: 1L/day   Skin: Intact  Diet Order: NPO  EDUCATION NEEDS: -No education needs identified at this time   Intake/Output Summary (Last 24 hours) at 07/29/13 1222 Last data filed at 07/29/13 1200  Gross per 24 hour  Intake 2197.32 ml  Output   1445 ml  Net 752.32 ml    Last BM: PTA   Labs:   Recent Labs Lab 07/28/13 0900 07/29/13 0320  NA 143 143  K 4.0 3.5  CL 98 106  CO2 30 28  BUN 30* 37*  CREATININE 0.71 0.91  CALCIUM 10.2 8.4  GLUCOSE 181* 208*    CBG (last 3)  No results found for this basename: GLUCAP,  in the last 72 hours  Scheduled Meds: . antiseptic oral rinse  15 mL Mouth Rinse QID  . carboxymethylcellulose  1 drop Both Eyes TID WC & HS  . chlorhexidine  15 mL Mouth Rinse BID  . CV[employer] EYE LUBRICANT NIGHTTIME  1 drop Ophthalmic QHS  . haloperidol lactate  5 mg Intravenous Q6H  . insulin aspart  0-15 Units Subcutaneous Q4H  . pantoprazole (PROTONIX) IV  40 mg Intravenous Q24H  . Tafluprost  1 drop Ophthalmic QHS  . timolol  1 drop Both Eyes BID    Continuous Infusions: . sodium chloride 10  mL/hr at 07/28/13 1700  . dexmedetomidine 0.4 mcg/kg/hr (07/29/13 1200)  . dextrose 5 % and 0.45 % NaCl with KCl 20 mEq/L 75 mL/hr at 07/29/13 1610    Past Medical History  Diagnosis Date  . Glaucoma   . Dry eye syndrome   . GI bleed   . Diverticulosis   . GERD (gastroesophageal reflux disease)   . Thoracogenic scoliosis of thoracolumbar region     SEVERE  . Chronic pancreatitis   . IBS (irritable bowel syndrome)   . Pelvic floor dysfunction     and rectalprolaspe  . Osteoporosis   . Internal hemorrhoids   . Hypertension   . Dry mouth   . Blood transfusion     HEMORRHAGED  1 WEEK AFTER COLONOSCOPY  -REQUIRED TRANSFUSION  . Abscessed tooth     STATES TOOTH PULLED RIGHT LOWER MOLAR   AND LEFT LOWER MOLAR ON3/25/13 AND PT ON ANTIBIOTIC  . Hiatal hernia     LARGE HIATAL HERNIA PER CXR REPORT  . 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 Nissen fundoplication    . Vaginal hysterectomy  2002    A&P repair/bladder sling  . Cataract extraction      bilateral  . Band hemorrhoidectomy    . Mouth surgery    . Colonoscopy  02/03/2008    diverticulosis, internal hemorrhoids  . Upper gastrointestinal endoscopy  02/12/2008    hiatal henia  . Flexible sigmoidoscopy  11/11/2008    internal hemorrhoids  . Hiatal hernia repair    . Mouth surgery    . Eye surgery      LASER OF BOTH EYES  FOR ELEVATED PRESSURE  . Bladder suspension  2002    Grapey - w/ hysty  . Staple hemorrhoidectomy    . Esophagogastroduodenoscopy N/A 07/28/2013    Procedure: ESOPHAGOGASTRODUODENOSCOPY (EGD);  Surgeon: Rachael Fee, MD;  Location: [facility];  Service: Endoscopy;  Laterality: N/A;  . Esophagogastroduodenoscopy N/A 07/28/2013    Procedure: ESOPHAGOGASTRODUODENOSCOPY (EGD);  Surgeon: Rachael Fee, MD;  Location: [facility];  Service: Endoscopy;   Laterality: N/A;    Levon Hedger MS, RD, LDN [phone] Pager [phone] After Hours Pager

## 2013-07-29 NOTE — Progress Notes (Signed)
PARENTERAL NUTRITION CONSULT NOTE - INITIAL  Pharmacy Consult for TNA Indication: Hiatal hernia, unable to use enteral nutrition  Allergies  Allergen Reactions  . Codeine     REACTION: Dlizzy    Patient Measurements: Height: 4\' 7"  (139.7 cm) Weight: 94 lb 9.2 oz (42.9 kg) IBW/kg (Calculated) : 34 Usual Weight: 98lb (44.5kg)  Vital Signs: Temp: 98.8 F (37.1 C) (11/19 1200) Temp src: Axillary (11/19 1200) BP: 103/72 mmHg (11/19 1235) Pulse Rate: 70 (11/19 1235) Intake/Output from previous day: 11/18 0701 - 11/19 0700 In: 1673.4 [I.V.:1673.4] Out: 1360 [Urine:910; Emesis/NG output:450] Intake/Output from this shift: Total I/O In: 523.9 [I.V.:523.9] Out: 85 [Urine:85]  Labs:  Recent Labs  07/28/13 0900 07/29/13 0320  WBC 10.8* 6.8  HGB 14.1 10.0*  HCT 41.2 29.8*  PLT 312 205  APTT 27  --   INR 1.14  --      Recent Labs  07/28/13 0900 07/29/13 0320  NA 143 143  K 4.0 3.5  CL 98 106  CO2 30 28  GLUCOSE 181* 208*  BUN 30* 37*  CREATININE 0.71 0.91  CALCIUM 10.2 8.4  PROT 7.9  --   ALBUMIN 4.2  --   AST 32  --   ALT 18  --   ALKPHOS 99  --   BILITOT 0.9  --    Estimated Creatinine Clearance: 33.2 ml/min (by C-G formula based on Cr of 0.91).   No results found for this basename: GLUCAP,  in the last 72 hours  Medical History: Past Medical History  Diagnosis Date  . Glaucoma   . Dry eye syndrome   . GI bleed   . Diverticulosis   . GERD (gastroesophageal reflux disease)   . Thoracogenic scoliosis of thoracolumbar region     SEVERE  . Chronic pancreatitis   . IBS (irritable bowel syndrome)   . Pelvic floor dysfunction     and rectalprolaspe  . Osteoporosis   . Internal hemorrhoids   . Hypertension   . Dry mouth   . Blood transfusion     HEMORRHAGED  1 WEEK AFTER COLONOSCOPY  -REQUIRED TRANSFUSION  . Abscessed tooth     STATES TOOTH PULLED RIGHT LOWER MOLAR   AND LEFT LOWER MOLAR ON3/25/13 AND PT ON ANTIBIOTIC  . Hiatal hernia    LARGE HIATAL HERNIA PER CXR REPORT  . 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  Albumin 4.2 No recent prealbumin, triglycerides.   Lines: Peripheral IV L and R forearm NG/OG tube Awaiting PICC placement  Assessment: 39 yoF with long history of hiatal hernia s/p nissen fundoplication presents with intractable vomiting and concern for UGIB. 11/18 EGD showed possible gastric volvulus with feculant material in esophagus.  11/19 CT abd/pelvis/chest findings did not suggest gastric volvulus, but did confirm large hiatal hernia with gastric outlet obstruction.  Pt was electively placed on mechanical ventilator on 11/18 following EGD to prevent aspiration and currently remains intubated and sedated.  Plans for open repair of hiatal hernia for either Thursday or Friday.  Pt  unable to receive enteral feedings and TNA has been ordered per pharmacy.   Nutritional Goals:  RD assessment 11/19: 1000 kCal, 60-70 grams of protein, 1L fluid per day Clinimix E5/20 @ 35 ml/hr with lipids @ 99ml/hr will provide  ~1000 Kcal/day with 42 grams of protein  Plan:  At 1800 today, IF PICC LINE has been placed:   Start Clinimix E 5/20 at 20 ml/hr and 20% fat emulsion at 4 ml/hr (provides 602 Kcal/day (30% from lipids), 24g protein)  Plan to advance as tolerated to the goal rate.  TNA to contain standard multivitamins and trace elements.  Reduce IVF to 50 ml/hr.  Continue SSI q 4 h  TNA lab panels on Mondays & Thursdays.  F/u daily.  Haynes Hoehn, PharmD 07/29/2013, 3:25 PM  Pager: [phone]

## 2013-07-30 ENCOUNTER — Inpatient Hospital Stay (HOSPITAL_COMMUNITY): Payer: Medicare Other | Admitting: Anesthesiology

## 2013-07-30 ENCOUNTER — Encounter (HOSPITAL_COMMUNITY): Payer: Medicare Other | Admitting: Anesthesiology

## 2013-07-30 ENCOUNTER — Encounter (HOSPITAL_COMMUNITY): Admission: AD | Disposition: A | Discharge status: Skilled Nursing Facility | Payer: Self-pay | Source: Home / Self Care

## 2013-07-30 ENCOUNTER — Inpatient Hospital Stay (HOSPITAL_COMMUNITY): Payer: Medicare Other

## 2013-07-30 ENCOUNTER — Encounter (HOSPITAL_COMMUNITY): Payer: Self-pay | Admitting: Certified Registered"

## 2013-07-30 DIAGNOSIS — T182XXA Foreign body in stomach, initial encounter: Secondary | ICD-10-CM

## 2013-07-30 DIAGNOSIS — K660 Peritoneal adhesions (postprocedural) (postinfection): Secondary | ICD-10-CM

## 2013-07-30 HISTORY — PX: GASTROSTOMY TUBE PLACEMENT: SHX655

## 2013-07-30 HISTORY — PX: HIATAL HERNIA REPAIR: SHX195

## 2013-07-30 LAB — BASIC METABOLIC PANEL
BUN: 29 mg/dL — ABNORMAL HIGH (ref 6–23)
Calcium: 7.1 mg/dL — ABNORMAL LOW (ref 8.4–10.5)
Chloride: 112 mEq/L (ref 96–112)
Creatinine, Ser: 0.71 mg/dL (ref 0.50–1.10)
GFR calc Af Amer: >90 mL/min (ref >90)
GFR calc non Af Amer: 84 mL/min — ABNORMAL LOW (ref >90)

## 2013-07-30 LAB — CBC
HCT: 26.5 % — ABNORMAL LOW (ref 36.0–46.0)
Hemoglobin: 8.8 g/dL — ABNORMAL LOW (ref 12.0–15.0)
MCH: 30.1 pg (ref 26.0–34.0)
MCH: 29.6 pg (ref 26.0–34.0)
MCHC: 33.2 g/dL (ref 30.0–36.0)
MCHC: 33.7 g/dL (ref 30.0–36.0)
MCV: 87.9 fL (ref 78.0–100.0)
Platelets: 163 10*3/uL (ref 150–400)
RBC: 4.05 MIL/uL (ref 3.87–5.11)
RDW: 13 % (ref 11.5–15.5)
RDW: 13.7 % (ref 11.5–15.5)
WBC: 9.3 10*3/uL (ref 4.0–10.5)

## 2013-07-30 LAB — COMPREHENSIVE METABOLIC PANEL
AST: 15 U/L (ref 0–37)
Alkaline Phosphatase: 59 U/L (ref 39–117)
BUN: 31 mg/dL — ABNORMAL HIGH (ref 6–23)
CO2: 28 mEq/L (ref 19–32)
Calcium: 8.2 mg/dL — ABNORMAL LOW (ref 8.4–10.5)
Chloride: 109 mEq/L (ref 96–112)
Creatinine, Ser: 0.67 mg/dL (ref 0.50–1.10)
GFR calc non Af Amer: 86 mL/min — ABNORMAL LOW (ref >90)
Total Bilirubin: 0.4 mg/dL (ref 0.3–1.2)

## 2013-07-30 LAB — TRIGLYCERIDES: Triglycerides: 87 mg/dL (ref <150)

## 2013-07-30 LAB — DIFFERENTIAL
Basophils Absolute: 0 10*3/uL (ref 0.0–0.1)
Basophils Relative: 0 % (ref 0–1)
Eosinophils Absolute: 0 10*3/uL (ref 0.0–0.7)
Eosinophils Relative: 1 % (ref 0–5)
Lymphs Abs: 0.7 10*3/uL (ref 0.7–4.0)
Monocytes Absolute: 0.8 10*3/uL (ref 0.1–1.0)
Neutrophils Relative %: 74 % (ref 43–77)

## 2013-07-30 LAB — GLUCOSE, CAPILLARY
Glucose-Capillary: 190 mg/dL — ABNORMAL HIGH (ref 70–99)
Glucose-Capillary: 121 mg/dL — ABNORMAL HIGH (ref 70–99)

## 2013-07-30 LAB — PREALBUMIN
Prealbumin: 9.9 mg/dL — ABNORMAL LOW (ref 17.0–34.0)
Prealbumin: 9.6 mg/dL — ABNORMAL LOW (ref 17.0–34.0)

## 2013-07-30 LAB — POCT I-STAT 4, (NA,K, GLUC, HGB,HCT)
HCT: 36 % (ref 36.0–46.0)
Hemoglobin: 12.2 g/dL (ref 12.0–15.0)
Potassium: 4.2 mEq/L (ref 3.5–5.1)
Sodium: 145 mEq/L (ref 135–145)

## 2013-07-30 LAB — PREPARE RBC (CROSSMATCH)

## 2013-07-30 LAB — PHOSPHORUS: Phosphorus: 2 mg/dL — ABNORMAL LOW (ref 2.3–4.6)

## 2013-07-30 SURGERY — REPAIR, HERNIA, HIATAL
Anesthesia: General | Site: Abdomen | Wound class: Contaminated

## 2013-07-30 MED ORDER — ROCURONIUM BROMIDE 100 MG/10ML IV SOLN
INTRAVENOUS | Status: DC | PRN
  Administered 2013-07-30 (×2): 10 mg via INTRAVENOUS
  Administered 2013-07-30: 30 mg via INTRAVENOUS
  Administered 2013-07-30 (×3): 10 mg via INTRAVENOUS

## 2013-07-30 MED ORDER — FENTANYL CITRATE 0.05 MG/ML IJ SOLN
INTRAMUSCULAR | Status: DC | PRN
  Administered 2013-07-30 (×2): 50 ug via INTRAVENOUS
  Administered 2013-07-30: 100 ug via INTRAVENOUS

## 2013-07-30 MED ORDER — FENTANYL CITRATE 0.05 MG/ML IJ SOLN
INTRAMUSCULAR | Status: AC
  Administered 2013-07-30: 25 ug
  Filled 2013-07-30: qty 2

## 2013-07-30 MED ORDER — SODIUM PHOSPHATE 3 MMOLE/ML IV SOLN
10.0000 mmol | Freq: Once | INTRAVENOUS | Status: AC
  Administered 2013-07-30: 10 mmol via INTRAVENOUS
  Filled 2013-07-30: qty 3.33

## 2013-07-30 MED ORDER — PHENYLEPHRINE HCL 10 MG/ML IJ SOLN
INTRAMUSCULAR | Status: DC | PRN
  Administered 2013-07-30 (×2): 40 ug via INTRAVENOUS

## 2013-07-30 MED ORDER — FENTANYL CITRATE 0.05 MG/ML IJ SOLN
INTRAMUSCULAR | Status: AC
  Filled 2013-07-30: qty 5

## 2013-07-30 MED ORDER — SODIUM CHLORIDE 0.9 % IV SOLN
INTRAVENOUS | Status: DC | PRN
  Administered 2013-07-30: 13:00:00 via INTRAVENOUS

## 2013-07-30 MED ORDER — LACTATED RINGERS IV SOLN: INTRAVENOUS | Status: DC

## 2013-07-30 MED ORDER — PHENYLEPHRINE HCL 10 MG/ML IJ SOLN
10.0000 mg | INTRAVENOUS | Status: DC | PRN
  Administered 2013-07-30: 40 ug/min via INTRAVENOUS

## 2013-07-30 MED ORDER — ROCURONIUM BROMIDE 100 MG/10ML IV SOLN
INTRAVENOUS | Status: AC
  Filled 2013-07-30: qty 1

## 2013-07-30 MED ORDER — SODIUM CHLORIDE 0.9 % IV BOLUS (SEPSIS)
1000.0000 mL | Freq: Once | INTRAVENOUS | Status: AC
  Administered 2013-07-30: 1000 mL via INTRAVENOUS

## 2013-07-30 MED ORDER — 0.9 % SODIUM CHLORIDE (POUR BTL) OPTIME
TOPICAL | Status: DC | PRN
  Administered 2013-07-30: 2000 mL

## 2013-07-30 MED ORDER — CEFAZOLIN SODIUM 1-5 GM-% IV SOLN
1.0000 g | Freq: Once | INTRAVENOUS | Status: AC
  Administered 2013-07-30: 1 g via INTRAVENOUS
  Filled 2013-07-30: qty 50

## 2013-07-30 MED ORDER — FAT EMULSION 20 % IV EMUL
250.0000 mL | INTRAVENOUS | Status: AC
  Administered 2013-07-30: 250 mL via INTRAVENOUS
  Filled 2013-07-30: qty 250

## 2013-07-30 MED ORDER — POTASSIUM CHLORIDE 10 MEQ/50ML IV SOLN
10.0000 meq | INTRAVENOUS | Status: AC
  Administered 2013-07-30 (×2): 10 meq via INTRAVENOUS
  Filled 2013-07-30 (×2): qty 50

## 2013-07-30 MED ORDER — TRACE MINERALS CR-CU-F-FE-I-MN-MO-SE-ZN IV SOLN
INTRAVENOUS | Status: AC
  Administered 2013-07-30: 20 mL/h via INTRAVENOUS
  Administered 2013-07-30: 18:00:00 via INTRAVENOUS
  Filled 2013-07-30: qty 1000

## 2013-07-30 MED ORDER — CEFAZOLIN SODIUM 1-5 GM-% IV SOLN
1.0000 g | Freq: Three times a day (TID) | INTRAVENOUS | Status: DC
  Administered 2013-07-30 – 2013-08-03 (×12): 1 g via INTRAVENOUS
  Filled 2013-07-30 (×14): qty 50

## 2013-07-30 MED ORDER — LIDOCAINE HCL (CARDIAC) 20 MG/ML IV SOLN
INTRAVENOUS | Status: AC
  Filled 2013-07-30: qty 5

## 2013-07-30 MED ORDER — LACTATED RINGERS IV SOLN
INTRAVENOUS | Status: DC | PRN
  Administered 2013-07-30 (×2): via INTRAVENOUS

## 2013-07-30 MED ORDER — SUCCINYLCHOLINE CHLORIDE 20 MG/ML IJ SOLN
INTRAMUSCULAR | Status: AC
  Filled 2013-07-30: qty 1

## 2013-07-30 MED ORDER — PHENYLEPHRINE HCL 10 MG/ML IJ SOLN
INTRAMUSCULAR | Status: AC
  Filled 2013-07-30: qty 1

## 2013-07-30 MED ORDER — PROPOFOL 10 MG/ML IV BOLUS
INTRAVENOUS | Status: AC
  Filled 2013-07-30: qty 20

## 2013-07-30 MED ORDER — MIDAZOLAM HCL 5 MG/5ML IJ SOLN
INTRAMUSCULAR | Status: DC | PRN
  Administered 2013-07-30 (×2): 1 mg via INTRAVENOUS

## 2013-07-30 MED ORDER — SODIUM CHLORIDE 0.9 % IV BOLUS (SEPSIS)
500.0000 mL | Freq: Once | INTRAVENOUS | Status: AC
  Administered 2013-07-31: 500 mL via INTRAVENOUS

## 2013-07-30 MED ORDER — HYDROMORPHONE HCL PF 1 MG/ML IJ SOLN: 0.2500 mg | INTRAMUSCULAR | Status: DC | PRN

## 2013-07-30 MED ORDER — MIDAZOLAM HCL 2 MG/2ML IJ SOLN
INTRAMUSCULAR | Status: AC
  Filled 2013-07-30: qty 2

## 2013-07-30 MED ORDER — LACTATED RINGERS IV SOLN
INTRAVENOUS | Status: DC | PRN
  Administered 2013-07-30: 14:00:00 via INTRAVENOUS

## 2013-07-30 MED ORDER — SODIUM CHLORIDE 0.9 % IV SOLN
10.0000 ug/h | INTRAVENOUS | Status: DC
  Administered 2013-07-30: 25 ug/h via INTRAVENOUS
  Filled 2013-07-30: qty 50

## 2013-07-30 SURGICAL SUPPLY — 47 items
APPLICATOR COTTON TIP 6IN STRL (MISCELLANEOUS) ×1 IMPLANT
BLADE EXTENDED COATED 6.5IN (ELECTRODE) IMPLANT
BLADE HEX COATED 2.75 (ELECTRODE) ×2 IMPLANT
CANISTER SUCTION 2500CC (MISCELLANEOUS) ×1 IMPLANT
CATH GJ 24FR (CATHETERS) ×1 IMPLANT
COVER MAYO STAND STRL (DRAPES) IMPLANT
DEVICE SUTURE ENDOST 10MM (ENDOMECHANICALS) ×1 IMPLANT
DRAIN CHANNEL RND F F (WOUND CARE) ×1 IMPLANT
DRAIN PENROSE 18X1/2 LTX STRL (DRAIN) ×1 IMPLANT
DRAPE LAPAROSCOPIC ABDOMINAL (DRAPES) ×2 IMPLANT
DRAPE UTILITY XL STRL (DRAPES) ×2 IMPLANT
DRAPE WARM FLUID 44X44 (DRAPE) ×1 IMPLANT
ELECT REM PT RETURN 9FT ADLT (ELECTROSURGICAL) ×2
ELECTRODE REM PT RTRN 9FT ADLT (ELECTROSURGICAL) ×1 IMPLANT
EVACUATOR SILICONE 100CC (DRAIN) ×1 IMPLANT
GLOVE BIOGEL PI IND STRL 7.0 (GLOVE) ×1 IMPLANT
GLOVE BIOGEL PI INDICATOR 7.0 (GLOVE) ×1
GLOVE ECLIPSE 8.0 STRL XLNG CF (GLOVE) ×3 IMPLANT
GLOVE INDICATOR 8.0 STRL GRN (GLOVE) ×4 IMPLANT
GOWN PREVENTION PLUS LG XLONG (DISPOSABLE) ×2 IMPLANT
GOWN STRL REIN XL XLG (GOWN DISPOSABLE) ×4 IMPLANT
KIT BASIN OR (CUSTOM PROCEDURE TRAY) ×2 IMPLANT
LIGASURE IMPACT 36 18CM CVD LR (INSTRUMENTS) ×1 IMPLANT
NS IRRIG 1000ML POUR BTL (IV SOLUTION) ×3 IMPLANT
PACK GENERAL/GYN (CUSTOM PROCEDURE TRAY) ×2 IMPLANT
SPONGE DRAIN TRACH 4X4 STRL 2S (GAUZE/BANDAGES/DRESSINGS) ×1 IMPLANT
SPONGE GAUZE 4X4 12PLY (GAUZE/BANDAGES/DRESSINGS) ×2 IMPLANT
SPONGE LAP 18X18 X RAY DECT (DISPOSABLE) ×1 IMPLANT
STAPLER VISISTAT 35W (STAPLE) ×1 IMPLANT
SUCTION POOLE TIP (SUCTIONS) IMPLANT
SUT ETHILON 2 0 PS N (SUTURE) ×1 IMPLANT
SUT PDS AB 1 CTX 36 (SUTURE) IMPLANT
SUT PDS AB 4-0 SH 27 (SUTURE) ×8 IMPLANT
SUT SILK 2 0 (SUTURE)
SUT SILK 2 0 SH (SUTURE) ×1 IMPLANT
SUT SILK 2 0 SH CR/8 (SUTURE) ×2 IMPLANT
SUT SILK 2-0 18XBRD TIE 12 (SUTURE) IMPLANT
SUT SILK 3 0 (SUTURE)
SUT SILK 3 0 SH CR/8 (SUTURE) IMPLANT
SUT SILK 3-0 18XBRD TIE 12 (SUTURE) IMPLANT
SUT SURGIDAC NAB ES-9 0 48 120 (SUTURE) ×3 IMPLANT
SUT VICRYL 2 0 18  UND BR (SUTURE) ×1
SUT VICRYL 2 0 18 UND BR (SUTURE) IMPLANT
TAPE CLOTH SURG 4X10 WHT LF (GAUZE/BANDAGES/DRESSINGS) ×1 IMPLANT
TOWEL OR 17X26 10 PK STRL BLUE (TOWEL DISPOSABLE) ×4 IMPLANT
TRAY FOLEY CATH 14FRSI W/METER (CATHETERS) IMPLANT
YANKAUER SUCT BULB TIP NO VENT (SUCTIONS) IMPLANT

## 2013-07-30 NOTE — Progress Notes (Addendum)
PULMONARY  / CRITICAL CARE MEDICINE  Name: [name] MRN: [id number] DOB: 1941-01-28    ADMISSION DATE:  07/28/2013 CONSULTATION DATE:  11/19  REFERRING MD :  Christella Hartigan PRIMARY SERVICE: [facility] >> PCCM  CHIEF COMPLAINT: intractable vomiting. Intubated to protect airway  BRIEF PATIENT DESCRIPTION:  30 F with large HH adm 11/18 by [facility] with intractable vomiting and concern for UGIB. Underwent EGD 11/19 revealing possible gastric volvulus. Intubated after EGD to protect airway and permit completion of eval.   SIGNIFICANT EVENTS / STUDIES:  11/19 EGD: stomach was filled with solid brown, feculent appearing material. The gastric anatomy was very abnormal, distorted and twisted 11/19 CT chest/abd/pelvis:  11-20 to OR  LINES / TUBES: ETT 11/19 >>  11-19 rt picc>>  CULTURES: MRSA via PCR (-)  ANTIBIOTICS: None   HISTORY OF PRESENT ILLNESS:   Admission history reviewed. Findings on EGD discussed with Dr Christella Hartigan. Pt unable to provide history to due sedated condition after EGD.     SUBJECTIVE: Intubated, m/v ill appearing elderly female. Awake and responsive while on sedation.   VITAL SIGNS: Temp:  [97.2 F (36.2 C)-98.8 F (37.1 C)] 98.4 F (36.9 C) (11/20 0400) Pulse Rate:  [45-78] 61 (11/20 0700) Resp:  [12-25] 14 (11/20 0700) BP: (75-119)/(53-73) 99/61 mmHg (11/20 0700) SpO2:  [96 %-100 %] 100 % (11/20 0700) FiO2 (%):  [40 %] 40 % (11/20 0452) Weight:  [44.8 kg (98 lb 12.3 oz)] 44.8 kg (98 lb 12.3 oz) (11/20 0500) HEMODYNAMICS:   VENTILATOR SETTINGS: Vent Mode:  [-] PRVC FiO2 (%):  [40 %] 40 % Set Rate:  [14 bmp] 14 bmp Vt Set:  [400 mL] 400 mL PEEP:  [5 cmH20] 5 cmH20 Plateau Pressure:  [15 cmH20-18 cmH20] 17 cmH20 INTAKE / OUTPUT: Intake/Output     11/19 0701 - 11/20 0700 11/20 0701 - 11/21 0700   I.V. (mL/kg) 1930 (43.1)    TPN 308.7    Total Intake(mL/kg) 2238.7 (50)    Urine (mL/kg/hr) 660 (0.6)    Emesis/NG output 775 (0.7)    Total Output 1435     Net  +803.7            PHYSICAL EXAMINATION: General:  Sedated, NAD  Neuro:  RASS 0, not F/C, MAEs, no focal deficits HEENT: [place], AT. PEERL, Pupils 3 mm. Brisk. Sclera non-ictaric. Conjunctiva pink, pale, & moist.  Poor dentition, feculent staining of oropharynx. Nasal mucosa intact. Neck supple. 4 mm nodule left thyroid. Tender to palpation. (-) redness or warmth. Trachea midline.  Cardiovascular:  ST, RR. (-) MRG. Carotid upstroke 2+. (-) JVD. Cap refill >3 sec. Bilateral extremities cool. Pulses 1+ bilaterally. (-) edema.  Lungs: Rhonchi right side.  Abdomen:  Flat, soft, tender, no HSM. Hypoactive BS x4.   LABS: I have reviewed all of today's lab results. Relevant abnormalities are discussed in the A/P section    CXR:  Large HH, ? bilateral infiltrates 11/20  ASSESSMENT / PLAN:  PULMONARY A: Acute respiratory failure     Compromised airway protection and intractable vomiting P:   -  Continue M/V and current settings in anticipation of surgery 11/20 -  Continue Vent bundle  -  No SBT today - wean from vent postop as tolerated -chest x ray post op CARDIOVASCULAR A: No acute issues        P:  -- Continuous tele    RENAL A:  Hypokalemia/phos      Elevated BUN>>>improving  P:   -  Correct electrolytes -  Continue BMET   -  IVF to 45 ml/hr   GASTROINTESTINAL A:  Large HH>>>>repair 11/20       Gastric outlet obstruction - suspected gastric volvulus      No evidence of UGIB P:   -  Increase Clinimix to 25 ml and Emulsion to 4 ml/hr  - Continue TNA -  Continue PPI -  Maintain OG ILWS  -  Zofran PRN   HEMATOLOGIC A:  Anemia P:  - DVT px: SCDs - Continue to monitor CBC  - Transfuse for HBG <7   INFECTIOUS A:  No issues P:   - Monitor  ENDOCRINE A:  Hyperglycemia without hx of DM P:   - Monitor glu on chem panels (145)  - SSI for glu > 180  NEUROLOGIC A:  Acute encephalopathy after EGD, resolved  P:   - Maintain Precedex gtt   Patient is  scheduled for surgery today for repair of recurrent hiatal hernia and gastrostomy.    Pedro Earls, [facility] NP-S Pulmonary and Critical Care Medicine [facility] Pager: [phone]  07/30/2013, 9:06 AM  Brett Canales Minor ACNP [facility] PCCM Pager [phone] till 3 pm If no answer page [phone] 07/30/2013, 10:40 AM  PCCM ATTENDING: I have interviewed and examined the patient and reviewed the database. I have formulated the assessment and plan as reflected in the note above with amendments made by me. 30 mins of direct critical care time provided. For OR today. Leave intubated post op. Will assess for extubation 11/21  Billy Fischer, MD;  PCCM service; Mobile [phone]

## 2013-07-30 NOTE — Progress Notes (Signed)
2 Days Post-Op  Subjective: On vent.  Arouseable  Objective: Vital signs in last 24 hours: Temp:  [97.2 F (36.2 C)-98.8 F (37.1 C)] 98.4 F (36.9 C) (11/20 0400) Pulse Rate:  [45-87] 61 (11/20 0700) Resp:  [12-25] 14 (11/20 0700) BP: (75-119)/(53-73) 99/61 mmHg (11/20 0700) SpO2:  [96 %-100 %] 100 % (11/20 0700) FiO2 (%):  [40 %] 40 % (11/20 0452) Weight:  [98 lb 12.3 oz (44.8 kg)] 98 lb 12.3 oz (44.8 kg) (11/20 0500) Last BM Date:  (pta)  Intake/Output from previous day: 11/19 0701 - 11/20 0700 In: 2238.7 [I.V.:1930; TPN:308.7] Out: 1435 [Urine:660; Emesis/NG output:775] Intake/Output this shift:    PE: General- In NAD Abdomen-soft, flat, non-tender  Lab Results:   Recent Labs  07/29/13 0320 07/30/13 0440  WBC 6.8 5.9  HGB 10.0* 8.8*  HCT 29.8* 26.5*  PLT 205 154   BMET  Recent Labs  07/29/13 0320 07/30/13 0440  NA 143 144  K 3.5 3.4*  CL 106 109  CO2 28 28  GLUCOSE 208* 145*  BUN 37* 31*  CREATININE 0.91 0.67  CALCIUM 8.4 8.2*   PT/INR  Recent Labs  07/28/13 0900  LABPROT 14.4  INR 1.14   Comprehensive Metabolic Panel:    Component Value Date/Time   NA 144 07/30/2013 0440   K 3.4* 07/30/2013 0440   CL 109 07/30/2013 0440   CO2 28 07/30/2013 0440   BUN 31* 07/30/2013 0440   CREATININE 0.67 07/30/2013 0440   GLUCOSE 145* 07/30/2013 0440   CALCIUM 8.2* 07/30/2013 0440   AST 15 07/30/2013 0440   ALT 9 07/30/2013 0440   ALKPHOS 59 07/30/2013 0440   BILITOT 0.4 07/30/2013 0440   PROT 4.9* 07/30/2013 0440   ALBUMIN 2.4* 07/30/2013 0440     Studies/Results: Dg Abd 1 View  07/28/2013   CLINICAL DATA:  Orogastric tube placement.  EXAM: ABDOMEN - 1 VIEW  COMPARISON:  Abdominal radiograph July 28, 2013 at 1453 hr  FINDINGS: Nasogastric tube in the region of the mid stomach. Due to severe thoracolumbar scoliosis, exact placement of the tube may be difficult ascertain. Surgical clips about the gastroesophageal junction region.  1 cm  calcification in left abdomen may reflect nephrolithiasis. Moderate amount of retained large bowel stool with oral nonspecific bowel gas pattern. Patient's known hiatal hernia is better appreciated on prior examination.  IMPRESSION: Nasogastric tube tip projecting in region of mid stomach.  Nonspecific bowel gas pattern with moderate amount of retained large bowel stool.   Electronically Signed   By: Awilda Metro   On: 07/28/2013 22:19   Ct Chest W Contrast  07/29/2013   CLINICAL DATA:  Large hiatal hernia. History of aspiration. Abdominal pain, nausea and vomiting. Evaluate for potential gastric volvulus.  EXAM: CT CHEST, ABDOMEN, AND PELVIS WITH CONTRAST  TECHNIQUE: Multidetector CT imaging of the chest, abdomen and pelvis was performed following the standard protocol during bolus administration of intravenous contrast.  CONTRAST:  80mL OMNIPAQUE IOHEXOL 300 MG/ML  SOLN  COMPARISON:  CT of the abdomen and pelvis 02/12/2008.  FINDINGS: CT CHEST FINDINGS  Mediastinum: Large hiatal hernia again noted. No evidence gastric volvulus at this time. Nasogastric tube extends into the distal stomach. The patient is intubated, with the tip of the endotracheal tube at the level the carina directed toward the right mainstem bronchus. Heart size is normal. There is no significant pericardial fluid, thickening or pericardial calcification. No pathologically enlarged mediastinal or hilar lymph nodes.  Lungs/Pleura: Trace right and  small left pleural effusions layering dependently are simple in appearance. No consolidative airspace disease. No pleural effusions. Areas of mild dependent atelectasis are noted throughout the lower lobes of the lungs bilaterally. No definite suspicious appearing pulmonary nodules or masses.  Musculoskeletal: Severe levoscoliosis of the thoracolumbar spine convex to the left at the thoracolumbar junction. There are no aggressive appearing lytic or blastic lesions noted in the visualized  portions of the skeleton.  CT ABDOMEN AND PELVIS FINDINGS  Abdomen/Pelvis: The gallbladder appears moderately distended, and there is a small amount of pericholecystic fluid best demonstrated on image 67 of series 2. However, the gallbladder wall does not appear thickened, and there are no definite high attenuation gallstones identified at this time. Common bile duct is normal in caliber. Pancreatic duct is not dilated. No intrahepatic biliary ductal dilatation. The appearance of the liver, pancreas, spleen, left adrenal gland and right kidney are unremarkable. The patient's right adrenal gland cannot be confidently identified secondary to her altered anatomy. Sub-cm low-attenuation lesion in the interpolar region of the left kidney is too small to definitively characterize, but is statistically likely a tiny cyst.  No significant volume of ascites. No pneumoperitoneum. No pathologic distention of small bowel. No definite lymphadenopathy is confidently identified within the abdomen or pelvis. Mild atherosclerosis throughout the abdominal and pelvic vasculature without definite aneurysm or dissection. Surgical clips around the esophageal hiatus just beneath the diaphragm. Foley balloon catheter with tip in the lumen of the urinary bladder. Nondependent gas in the lumen of the urinary bladder is presumably iatrogenic. Urinary bladder is otherwise unremarkable in appearance. Status post hysterectomy. Ovaries are not confidently identified may be surgically absent or atrophic.  Musculoskeletal: Severe levoscoliosis of the thoracolumbar spine redemonstrated, as discussed above. There are no aggressive appearing lytic or blastic lesions noted in the visualized portions of the skeleton.  IMPRESSION: 1. There is a large hiatal hernia, but there are no current findings to suggest gastric volvulus. 2. Low lying endotracheal tube. Tip of the endotracheal tube is currently at the carina directed toward the right mainstem  bronchus. This could be withdrawn 4-5 cm for more optimal placement. 3. Mild distension of the gallbladder with some pericholecystic fluid. Despite this, there are no definite gallstones, and there is no definite gallbladder wall thickening. These findings are of uncertain etiology and significance, but if there is clinical concern for cholecystitis related to non radiopaque stones or acalculus cholecystitis, further evaluation with ultrasound examination may provide additional diagnostic information if clinically indicated. 4. Small left and trace right-sided pleural effusions are simple in appearance and layer dependently. 5. Severe levoscoliosis of the thoracolumbar spine. 6. Additional postoperative changes and support apparatus, as above. The position of the endotracheal tube was called by telephone at the time of interpretation on 07/29/2013 at 12:47 PM to nurse Denny Peon (for Dr. Avel Peace), who verbally acknowledged these results, and indicated that the endotracheal tube had already been repositioned.   Electronically Signed   By: Trudie Reed M.D.   On: 07/29/2013 12:54   Ct Abdomen Pelvis W Contrast  07/29/2013   CLINICAL DATA:  Large hiatal hernia. History of aspiration. Abdominal pain, nausea and vomiting. Evaluate for potential gastric volvulus.  EXAM: CT CHEST, ABDOMEN, AND PELVIS WITH CONTRAST  TECHNIQUE: Multidetector CT imaging of the chest, abdomen and pelvis was performed following the standard protocol during bolus administration of intravenous contrast.  CONTRAST:  80mL OMNIPAQUE IOHEXOL 300 MG/ML  SOLN  COMPARISON:  CT of the abdomen and pelvis 02/12/2008.  FINDINGS: CT CHEST FINDINGS  Mediastinum: Large hiatal hernia again noted. No evidence gastric volvulus at this time. Nasogastric tube extends into the distal stomach. The patient is intubated, with the tip of the endotracheal tube at the level the carina directed toward the right mainstem bronchus. Heart size is normal. There is no  significant pericardial fluid, thickening or pericardial calcification. No pathologically enlarged mediastinal or hilar lymph nodes.  Lungs/Pleura: Trace right and small left pleural effusions layering dependently are simple in appearance. No consolidative airspace disease. No pleural effusions. Areas of mild dependent atelectasis are noted throughout the lower lobes of the lungs bilaterally. No definite suspicious appearing pulmonary nodules or masses.  Musculoskeletal: Severe levoscoliosis of the thoracolumbar spine convex to the left at the thoracolumbar junction. There are no aggressive appearing lytic or blastic lesions noted in the visualized portions of the skeleton.  CT ABDOMEN AND PELVIS FINDINGS  Abdomen/Pelvis: The gallbladder appears moderately distended, and there is a small amount of pericholecystic fluid best demonstrated on image 67 of series 2. However, the gallbladder wall does not appear thickened, and there are no definite high attenuation gallstones identified at this time. Common bile duct is normal in caliber. Pancreatic duct is not dilated. No intrahepatic biliary ductal dilatation. The appearance of the liver, pancreas, spleen, left adrenal gland and right kidney are unremarkable. The patient's right adrenal gland cannot be confidently identified secondary to her altered anatomy. Sub-cm low-attenuation lesion in the interpolar region of the left kidney is too small to definitively characterize, but is statistically likely a tiny cyst.  No significant volume of ascites. No pneumoperitoneum. No pathologic distention of small bowel. No definite lymphadenopathy is confidently identified within the abdomen or pelvis. Mild atherosclerosis throughout the abdominal and pelvic vasculature without definite aneurysm or dissection. Surgical clips around the esophageal hiatus just beneath the diaphragm. Foley balloon catheter with tip in the lumen of the urinary bladder. Nondependent gas in the lumen of  the urinary bladder is presumably iatrogenic. Urinary bladder is otherwise unremarkable in appearance. Status post hysterectomy. Ovaries are not confidently identified may be surgically absent or atrophic.  Musculoskeletal: Severe levoscoliosis of the thoracolumbar spine redemonstrated, as discussed above. There are no aggressive appearing lytic or blastic lesions noted in the visualized portions of the skeleton.  IMPRESSION: 1. There is a large hiatal hernia, but there are no current findings to suggest gastric volvulus. 2. Low lying endotracheal tube. Tip of the endotracheal tube is currently at the carina directed toward the right mainstem bronchus. This could be withdrawn 4-5 cm for more optimal placement. 3. Mild distension of the gallbladder with some pericholecystic fluid. Despite this, there are no definite gallstones, and there is no definite gallbladder wall thickening. These findings are of uncertain etiology and significance, but if there is clinical concern for cholecystitis related to non radiopaque stones or acalculus cholecystitis, further evaluation with ultrasound examination may provide additional diagnostic information if clinically indicated. 4. Small left and trace right-sided pleural effusions are simple in appearance and layer dependently. 5. Severe levoscoliosis of the thoracolumbar spine. 6. Additional postoperative changes and support apparatus, as above. The position of the endotracheal tube was called by telephone at the time of interpretation on 07/29/2013 at 12:47 PM to nurse Denny Peon (for Dr. Avel Peace), who verbally acknowledged these results, and indicated that the endotracheal tube had already been repositioned.   Electronically Signed   By: Trudie Reed M.D.   On: 07/29/2013 12:54   Dg Chest [facility]  07/30/2013   CLINICAL DATA:  Respiratory failure.  EXAM: PORTABLE CHEST - 1 VIEW  COMPARISON:  CT 07/29/2013.  Chest x-ray 07/29/2013.  FINDINGS: Endotracheal tube has  been withdrawn, its tip is now in good anatomic position 3 cm above the carina. NG tube noted projected over the stomach. PICC line with tip projected over the right atrium. There is a large sliding hiatal hernia again noted. Cardiac structures are difficult to evaluate due to patient rotation and large sliding hiatal hernia. Poor lung volumes. Small bilateral pleural effusions. No pneumothorax. No acute osseous lesions. Surgical clips left upper abdomen.  IMPRESSION: 1. Interim withdrawal of endotracheal tube. Its tip is in good anatomic position 3 cm above the carina. PICC line and NG tube in good anatomic position. 2. Poor lung volumes with small bilateral pleural effusions. 3. Large hiatal hernia.   Electronically Signed   By: Maisie Fus  Register   On: 07/30/2013 07:14   Dg Chest Port 1 View  07/29/2013   CLINICAL DATA:  Respiratory failure  EXAM: PORTABLE CHEST - 1 VIEW  COMPARISON:  Chest x-ray from yesterday  FINDINGS: Endotracheal tube ends 1 cm above the carina. Gastric suction tube is within a large hiatal hernia, which has been partly decompressed. Probable cardiomegaly, although distorted by rightward rotation.  No visible edema or consolidation. No pneumothorax or effusion. Levoscoliosis of the thoracolumbar spine.  IMPRESSION: 1. Low endotracheal tube, ending 1 cm above the carina. 2. Large hiatal hernia with indwelling orogastric tube. 3. Low volume lungs without edema or consolidation.   Electronically Signed   By: Tiburcio Pea M.D.   On: 07/29/2013 07:00   Dg Chest Port 1 View  07/28/2013   CLINICAL DATA:  ET tube placement.  EXAM: PORTABLE CHEST - 1 VIEW  COMPARISON:  07/28/2013  FINDINGS: There is an endotracheal tube with the tip at the proximal most portion of the right mainstem bronchus. There is no focal parenchymal opacity. There is no pleural effusion or pneumothorax. Stable heart mediastinum. Large hiatal hernia. The osseous structures are unremarkable.  IMPRESSION: There is an  endotracheal tube with the tip at the proximal most portion of the right mainstem bronchus. The endotracheal tube has already been retracted 3 cm by the time of this dictation. These results were called by telephone at the time of interpretation on 07/28/2013 at 4:12 PM to Rennis Petty, RN, who verbally acknowledged these results.   Electronically Signed   By: Elige Ko   On: 07/28/2013 16:13   Dg Chest Port 1 View  07/28/2013   CLINICAL DATA:  72 year old female with chest pain nausea and vomiting. Initial encounter.  EXAM: PORTABLE CHEST - 1 VIEW  COMPARISON:  12/11/2011 and earlier.  FINDINGS: Portable AP semi upright view at 0914 hrs. Large hiatal hernia. Stable lung volumes. Other mediastinal contours are within normal limits. Visualized tracheal air column is within normal limits. Allowing for portable technique, the lungs are clear. No pneumothorax. No pneumoperitoneum identified. Left upper quadrant surgical clips.  IMPRESSION: No acute cardiopulmonary abnormality.  Large hiatal hernia.   Electronically Signed   By: Augusto Gamble M.D.   On: 07/28/2013 09:27   Dg Abd 2 Views  07/28/2013   CLINICAL DATA:  Vomiting, known hiatal hernia, findings worrisome for gastric volvulus on endoscopy  EXAM: ABDOMEN - 2 VIEW  COMPARISON:  CT abdomen pelvis dated 02/12/2008  FINDINGS: Nonspecific bowel gas pattern without findings suspicious for obstruction.  No evidence of free air on the lateral decubitus view.  Large hiatal hernia with air-fluid level on the decubitus view.  Severe thoracolumbar scoliosis.  When correlating with the prior CT, there is a large hiatal hernia with inverted intrathoracic stomach (greater curvature flipped superiorly) on that study.  IMPRESSION: No findings to suggest bowel obstruction or free air.  Large hiatal hernia with air-fluid level on the decubitus view.  Prior CT demonstrates a large hiatal hernia with inverted intrathoracic stomach. If there is clinical concern for superimposed  acute gastric volvulus, consider CT.  These results were called by telephone at the time of interpretation on 07/28/2013 at 3:20 PM to Dr. Wendall Papa, who verbally acknowledged these results.   Electronically Signed   By: Charline Bills M.D.   On: 07/28/2013 15:23    Anti-infectives: Anti-infectives   None      Assessment   Large hiatal hernia with partial GOO   GI bleed likely secondary to above   Acute respiratory failure with hypoxia   Protein calorie malnutrition  ABL anemia    LOS: 2 days   Plan: To OR today for repair of recurrent hiatal hernia and gastrostomy.   [name] 07/30/2013

## 2013-07-30 NOTE — Progress Notes (Signed)
PARENTERAL NUTRITION CONSULT NOTE - INITIAL  Pharmacy Consult for TNA Indication: Hiatal hernia, unable to use enteral nutrition  Allergies  Allergen Reactions  . Codeine     REACTION: Dlizzy    Patient Measurements: Height: 4\' 7"  (139.7 cm) Weight: 98 lb 12.3 oz (44.8 kg) IBW/kg (Calculated) : 34 Usual Weight: 98lb (44.5kg)  Vital Signs:  Temp: 98.4 F (36.9 C) (11/20 0400) Temp src: Oral (11/20 0400) BP: 99/61 mmHg (11/20 0700) Pulse Rate: 61 (11/20 0700) Intake/Output from previous day: 11/19 0701 - 11/20 0700 In: 2238.7 [I.V.:1930; TPN:308.7] Out: 1435 [Urine:660; Emesis/NG output:775] Intake/Output from this shift:    Labs:  Recent Labs  07/28/13 0900 07/29/13 0320 07/30/13 0440  WBC 10.8* 6.8 5.9  HGB 14.1 10.0* 8.8*  HCT 41.2 29.8* 26.5*  PLT 312 205 154  APTT 27  --   --   INR 1.14  --   --      Recent Labs  07/28/13 0900 07/29/13 0320 07/29/13 2033 07/30/13 0440  NA 143 143  --  144  K 4.0 3.5  --  3.4*  CL 98 106  --  109  CO2 30 28  --  28  GLUCOSE 181* 208*  --  145*  BUN 30* 37*  --  31*  CREATININE 0.71 0.91  --  0.67  CALCIUM 10.2 8.4  --  8.2*  MG  --   --   --  2.0  PHOS  --   --   --  2.0*  PROT 7.9  --   --  4.9*  ALBUMIN 4.2  --   --  2.4*  AST 32  --   --  15  ALT 18  --   --  9  ALKPHOS 99  --   --  59  BILITOT 0.9  --   --  0.4  PREALBUMIN  --   --  9.9*  --   TRIG  --   --   --  87   Estimated Creatinine Clearance: 38.4 ml/min (by C-G formula based on Cr of 0.67).    Recent Labs  07/29/13 1949 07/30/13 0008 07/30/13 0427  GLUCAP 150* 135* 111*    Medical History: Past Medical History  Diagnosis Date  . Glaucoma   . Dry eye syndrome   . GI bleed   . Diverticulosis   . GERD (gastroesophageal reflux disease)   . Thoracogenic scoliosis of thoracolumbar region     SEVERE  . Chronic pancreatitis   . IBS (irritable bowel syndrome)   . Pelvic floor dysfunction     and rectalprolaspe  . Osteoporosis   .  Internal hemorrhoids   . Hypertension   . Dry mouth   . Blood transfusion     HEMORRHAGED  1 WEEK AFTER COLONOSCOPY  -REQUIRED TRANSFUSION  . Abscessed tooth     STATES TOOTH PULLED RIGHT LOWER MOLAR   AND LEFT LOWER MOLAR ON3/25/13 AND PT ON ANTIBIOTIC  . Hiatal hernia     LARGE HIATAL HERNIA PER CXR REPORT  . 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-"having urine coming out vagina after pees"   Insulin Requirements in the past 24 hours:  No hx DM, currently on SSI, required 4 units novolog over the past 24 hours   Current Nutrition:  NPO  Fluids:  D5/1/2NS with KCl 20 mEq/L @ 50 ml/hr  Labs:  Renal: SCr 0.67, CrCl ~38, UOP 0.6  ml/kg/hr Lytes: K 3.4, Phos 2, Corrected Ca 9.48 ok, other lytes ok Endo: CBGs 181-208 Hepatic: LFTs WNL Albumin decreased to 2.4, prealbumin 9.9  Triglycerides: 87 on 11/20  Lines: PICC triple lumen (11/19) Peripheral IV L and R forearm ( 11/18) NG/OG tube   Assessment: 49 yoF with long history of hiatal hernia s/p nissen fundoplication presents with intractable vomiting and concern for UGIB. 11/18 EGD showed possible gastric volvulus with feculant material in esophagus.  11/19 CT abd/pelvis/chest findings did not suggest gastric volvulus, but did confirm large hiatal hernia with gastric outlet obstruction.  Pt was electively placed on mechanical ventilator on 11/18 following EGD to prevent aspiration and currently remains intubated and sedated.  Plans for open repair of hiatal hernia for either Thursday or Friday.  Pt unable to receive enteral feedings and TNA has been ordered per pharmacy.  11/20:  Pt to go to OR today for [facility] repair surgery. No issues with TNA per RN.  Phosphorus and potassium low, replaced already.   Nutritional Goals:  RD assessment 11/19: 1000 kCal, 60-70 grams of protein, 1L fluid per day Clinimix E5/20 @ 35 ml/hr with lipids @ 83ml/hr will provide  ~1000 Kcal/day with 42 grams of  protein  Plan:  At 1800 today:   Increase Clinimix E 5/20 to 25 ml/hr and continue 20% fat emulsion at 4 ml/hr (provides 701 Kcal/day, 30g protein)  Plan to advance as tolerated to the goal rate.  TNA to contain standard multivitamins and trace elements.  Reduce IVF to 45 ml/hr.  Continue SSI q 4 h  TNA lab panels on Mondays & Thursdays.  Recheck phosphorus with AM labs (already ordered)  F/u daily.  Haynes Hoehn, PharmD 07/30/2013, 7:46 AM  Pager: [phone]

## 2013-07-30 NOTE — Op Note (Signed)
Pre-op Diagnosis:  Incarcerated recurrent hiatal hernia Post-op Diagnosis:  Same Procedure - Upper endoscopy Surgeon:  Jaecion Dempster K.  Indications:  72 yo female with recurrent hiatal hernia with gastric outlet obstruction undergoing surgical repair by Dr. Abbey Chatters.  I performed the upper endoscopy.    Description of procedure:  The scope was advanced into the esophagus with gentle insufflation.  There was sold old coffee-ground appearing debris, but the mucosa of the upper and mid esophagus appeared normal.  We were able to advance the scope through the gastroesophageal junction into the stomach.  The GE junction showed no sign of perforation.  The abdomen had been filled with saline and there was no sign of air leak.  The stomach is filled with old food product that was too thick to suction through the scope.  The visualized portions of the anterior stomach appeared free of any bleeding or ulceration.  The air was suctioned out of the stomach as the scope was removed.  [name]. [name], MD, [facility] Surgery  General/ Trauma Surgery  07/30/2013 3:19 PM

## 2013-07-30 NOTE — Anesthesia Preprocedure Evaluation (Addendum)
Anesthesia Evaluation  Patient identified by MRN, date of birth, ID band Patient awake    Reviewed: Allergy & Precautions, H&P , NPO status , Patient's Chart, lab work & pertinent test results  Airway Mallampati: II TM Distance: >3 FB Neck ROM: full    Dental no notable dental hx.    Pulmonary pneumonia -, unresolved, former smoker,   acute respiratory failure on ventilator currently following aspiration of feculent material.  Intubated to protect airway from aspiration with intractable vomiting.  SAO2 100% in ICU breath sounds clear to auscultation  Pulmonary exam normal       Cardiovascular Exercise Tolerance: Good hypertension, Pt. on medications Rhythm:regular Rate:Normal  Tachycardia.  VSS.  BP ok   Neuro/Psych glaucoma negative neurological ROS  negative psych ROS   GI/Hepatic negative GI ROS, Neg liver ROS, hiatal hernia, GERD-  Medicated and Controlled,Chronic pancreatitis.  Large hiatal hernia with gastric outlet obstruction   Endo/Other  negative endocrine ROS  Renal/GU negative Renal ROS  negative genitourinary   Musculoskeletal   Abdominal   Peds  Hematology negative hematology ROS (+)   Anesthesia Other Findings   Reproductive/Obstetrics negative OB ROS                        Anesthesia Physical Anesthesia Plan  ASA: IV and emergent  Anesthesia Plan: General   Post-op Pain Management:    Induction: Intravenous  Airway Management Planned: Oral ETT  Additional Equipment:   Intra-op Plan:   Post-operative Plan: Post-operative intubation/ventilation  Informed Consent: I have reviewed the patients History and Physical, chart, labs and discussed the procedure including the risks, benefits and alternatives for the proposed anesthesia with the patient or authorized representative who has indicated his/her understanding and acceptance.   Dental Advisory Given  Plan Discussed  with: CRNA and Surgeon  Anesthesia Plan Comments:        Anesthesia Quick Evaluation

## 2013-07-30 NOTE — Progress Notes (Signed)
eLink Nursing ICU Electrolyte Replacement Protocol  Patient Name: [name] DOB: [birth year] MRN: [id number]  Date of Service  07/30/2013   HPI/Events of Note    Recent Labs Lab 07/28/13 0900 07/29/13 0320 07/30/13 0440  NA 143 143 144  K 4.0 3.5 3.4*  CL 98 106 109  CO2 30 28 28   GLUCOSE 181* 208* 145*  BUN 30* 37* 31*  CREATININE 0.71 0.91 0.67  CALCIUM 10.2 8.4 8.2*  MG  --   --  2.0  PHOS  --   --  2.0*    Estimated Creatinine Clearance: 38.4 ml/min (by C-G formula based on Cr of 0.67).  Intake/Output     11/19 0701 - 11/20 0700   I.V. (mL/kg) 1930 (43.1)   TPN 308.7   Total Intake(mL/kg) 2238.7 (50)   Urine (mL/kg/hr) 660 (0.6)   Emesis/NG output 775 (0.7)   Total Output 1435   Net +803.7        - I/O DETAILED x24h    Total I/O In: 1103.4 [I.V.:839.4; TPN:264] Out: 565 [Urine:490; Emesis/NG output:75] - I/O THIS SHIFT    ASSESSMENT   eICURN Interventions  K+ 3.4 Phos 2.0 Replaced using ICU protocol   ASSESSMENT: MAJOR ELECTROLYTE    [name] 07/30/2013, 6:37 AM

## 2013-07-30 NOTE — Op Note (Signed)
Operative Note  [name] female 72 y.o. 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  (Upper endoscopy by Dr. [name])         Surgeon: Adolph Pollack   Assistants: Dr. Abigail Miyamoto, Dr. Estelle Grumbles, Dr. Marcille Blanco  Anesthesia: General endotracheal - Double lumen tube  Indications:  This is a 72 year old female with a long history of a recurrent large hiatal hernia. She's had progressively increasing symptoms. She is admitted with gastric outlet obstruction. Upper endoscopy was not able to relieve the obstruction. There is concern for a gastric volvulus. She now presents for the above procedure.    Procedure Detail:  She was brought to the operating room intubated from the intensive care unit. She's placed on the operating table. General anesthesia was given. The abdominal wall was sterilely prepped and draped.  A previous upper midline scar was reopened sharply. I extended the incision to below the umbilicus. Subcutaneous tissue was divided with electrocautery. The peritoneal cavity was entered. Omental adhesions to the abdominal wall were divided. There is approximately 25% of the stomach in the abdominal cavity. This portion the stomach was densely adherent to the liver. I lysed these adhesions. There was a twisting of the stomach and compression of the duodenum. Once I lysed these adhesions and partially reduced the stomach I was able to reduce the volvulus. There is a firm large mass that was mobile in the stomach.  I continued to mobilize the dense adhesions between the  stomach and the left lobe of the liver. I entered the lesser sac and divide some short gastric vessels. I continued lysing adhesions until I could identify the right crus. There is a large hernia sac densely adherent to the right crus. Per the esophagus was adherent to the right crus as well as the anterior portion of the crura. I mobilized the fundus of the stomach circumferentially. I carefully began reducing it out of the mediastinum by dividing firm attachments between the stomach and the sac. Some serosal defects were created and were closed with interrupted 2-0 silk sutures. I subsequently was able to reduce the stomach. I then had to dissect the esophagus free from densities the anterior hiatus. A small 4 mm perforation was made in the anterior esophagus just proximal to the gastroesophageal junction. This was closed in 2 layers with 4-0 PDS. There was small myotomy of the esophagus just inferior to this which was closed with interrupted 4-0 PDS.   Lyses of adhesions took approximately 2 hours.  I then performed a modified Dor fundoplication anchoring the stomach to the right crus and to the anterior aspect of the crus. This covered up the site of perforation well. Following this, upper endoscopy was performed. There was no evidence of leak in the distal esophagus or stomach.  A place on the anterior gastric body was chosen. 2 pursestring sutures of 2-0 silk were placed. An incision was made in the left abdominal wall skin. A size 24 [place] gastrostomy-jejunostomy tube was then passed into the peritoneal cavity. A gastrostomy was performed in the middle of the pursestring suture area. A large amount of dark colored fluid and debris was evacuated. This was consistent with a  large bezoar. Once this had been evacuated, the jejunostomy tube portion was threaded through the pylorus and into the duodenum. The gastrostomy was the balloon was then placed in the stomach. The pursestring sutures were then tied  down. The balloon on the gastrostomy was inflated. A four-quadrant gastropexy was then performed with interrupted 2-0 silk sutures.  The abdominal cavity was then copiously irrigated with saline. There is no evidence of bleeding or intestinal leak. A small incision was made in the right lower quadrant. A size 19 Blake drain was then placed into the abdominal cavity. It was placed on the left side of the gastroesophageal junction area. It was sewn to the skin with 3-0 nylon suture. The gastrostomy tube bolster was sewn to the skin with 3-0 nylon suture.  The midline fascia was then closed with running #1 PDS suture. The subcutaneous tissue was irrigated. The skin was closed with staples. The gastrostomy tube was placed to bag drainage. A closed suction bulb was then placed on the [facility] drain. Sterile dressings were applied.  She tolerated the procedure well. She was taken to intensive care unit in critical condition.         Drains: JACKSON-PRATT (JP)  Blood Given: one unit CC PRBC          Specimens: none        Complications:  Small esophageal perforation         Disposition: ICU - intubated and critically ill.         Condition: stable

## 2013-07-30 NOTE — Anesthesia Postprocedure Evaluation (Signed)
  Anesthesia Post-op Note  Patient: [name]  Procedure(s) Performed: Procedure(s): Exploratory Laparotomy, Lysis of Adhesions, Reduction of Hiatal Hernia, Repair of Esophogeal Perforations, Dor Fundal Plication, Evacuation of Gastric Bezoar, Gastrostomy Tube Insertion, Upper Endoscopy by Dr. [name] (N/A)  Patient Location: ICU  Anesthesia Type:General  Level of Consciousness: Patient remains intubated per anesthesia plan  Airway and Oxygen Therapy: Patient remains intubated per anesthesia plan and Patient placed on Ventilator (see vital sign flow sheet for setting)  Post-op Pain: none  Post-op Assessment: PATIENT'S CARDIOVASCULAR STATUS UNSTABLE and RESPIRATORY FUNCTION UNSTABLE  Post-op Vital Signs: stable  Complications: No apparent anesthesia complications

## 2013-07-30 NOTE — Progress Notes (Signed)
eLink Physician-Brief Progress Note Patient Name: [name] DOB: 04-02-1941 MRN: [id number]  Date of Service  07/30/2013   HPI/Events of Note   Tachycardic -appears to be in pain, on int fent Oliguric  eICU Interventions  fent gtt Ns bolus -likely hypovolemic post op   Intervention Category Intermediate Interventions: Oliguria - evaluation and management  Xolani Degracia V. 07/30/2013, 8:56 PM

## 2013-07-30 NOTE — Transfer of Care (Signed)
Immediate Anesthesia Transfer of Care Note  Patient: [name]  Procedure(s) Performed: Procedure(s): Exploratory Laparotomy, Lysis of Adhesions, Reduction of Hiatal Hernia, Repair of Esophogeal Perforations, Dor Fundal Plication, Evacuation of Gastric Bezoar, Gastrostomy Tube Insertion, Upper Endoscopy by Dr. [name] (N/A)  Patient Location: ICU  Anesthesia Type:General  Level of Consciousness: sedated and unresponsive  Airway & Oxygen Therapy: Patient remains intubated per anesthesia plan and Patient placed on Ventilator (see vital sign flow sheet for setting)  Post-op Assessment: Report given to PACU RN and Post -op Vital signs reviewed and stable  Post vital signs: Reviewed and stable  Complications: No apparent anesthesia complications

## 2013-07-31 ENCOUNTER — Inpatient Hospital Stay (HOSPITAL_COMMUNITY): Payer: Medicare Other

## 2013-07-31 ENCOUNTER — Encounter (HOSPITAL_COMMUNITY): Payer: Self-pay | Admitting: General Surgery

## 2013-07-31 DIAGNOSIS — E46 Unspecified protein-calorie malnutrition: Secondary | ICD-10-CM

## 2013-07-31 LAB — CBC
MCHC: 33.5 g/dL (ref 30.0–36.0)
MCV: 88.6 fL (ref 78.0–100.0)
Platelets: 179 10*3/uL (ref 150–400)
RDW: 13.9 % (ref 11.5–15.5)
WBC: 12.8 10*3/uL — ABNORMAL HIGH (ref 4.0–10.5)

## 2013-07-31 LAB — BASIC METABOLIC PANEL
BUN: 32 mg/dL — ABNORMAL HIGH (ref 6–23)
CO2: 20 mEq/L (ref 19–32)
Calcium: 7.1 mg/dL — ABNORMAL LOW (ref 8.4–10.5)
Chloride: 112 mEq/L (ref 96–112)
Creatinine, Ser: 0.84 mg/dL (ref 0.50–1.10)
GFR calc Af Amer: 79 mL/min — ABNORMAL LOW (ref >90)

## 2013-07-31 LAB — LACTIC ACID, PLASMA: Lactic Acid, Venous: 1.7 mmol/L (ref 0.5–2.2)

## 2013-07-31 LAB — GLUCOSE, CAPILLARY
Glucose-Capillary: 132 mg/dL — ABNORMAL HIGH (ref 70–99)
Glucose-Capillary: 146 mg/dL — ABNORMAL HIGH (ref 70–99)
Glucose-Capillary: 90 mg/dL (ref 70–99)

## 2013-07-31 MED ORDER — TRACE MINERALS CR-CU-F-FE-I-MN-MO-SE-ZN IV SOLN
INTRAVENOUS | Status: AC
  Administered 2013-07-31: 17:00:00 via INTRAVENOUS
  Filled 2013-07-31: qty 1000

## 2013-07-31 MED ORDER — VITAL AF 1.2 CAL PO LIQD
1000.0000 mL | ORAL | Status: DC
  Administered 2013-07-31: 1000 mL
  Filled 2013-07-31 (×2): qty 1000

## 2013-07-31 MED ORDER — FAT EMULSION 20 % IV EMUL
250.0000 mL | INTRAVENOUS | Status: AC
  Administered 2013-07-31: 250 mL via INTRAVENOUS
  Filled 2013-07-31: qty 250

## 2013-07-31 MED ORDER — SODIUM CHLORIDE 0.9 % IV BOLUS (SEPSIS)
500.0000 mL | Freq: Once | INTRAVENOUS | Status: AC
  Administered 2013-07-31: 500 mL via INTRAVENOUS

## 2013-07-31 NOTE — Procedures (Signed)
Extubation Procedure Note  Patient Details:   Name: BRENDIA DAMPIER DOB: Aug 01, 1941 MRN: [id number]   Airway Documentation:  Airway 4 mm (Active)    Evaluation  O2 sats: stable throughout Complications: No apparent complications Patient did tolerate procedure well. Bilateral Breath Sounds: Clear Suctioning: Airway Yes  Suzan Garibaldi 07/31/2013, 10:52 AM

## 2013-07-31 NOTE — Progress Notes (Signed)
Follow up with patient and son-in-law. Patient alert. Thankful for prayers on her behalf. She believes these helped her feel better. Good spiritual support for patient and family. Daughter and son-in-law are from [place]. Listening; support; presence; brought prayer shawl.

## 2013-07-31 NOTE — Progress Notes (Signed)
Narcotic Waste Wasted of Fentanyl (concentration 6mcg/ml) bag.  Wasted with [place] Ouch RN

## 2013-07-31 NOTE — Progress Notes (Addendum)
PULMONARY  / CRITICAL CARE MEDICINE  Name: [name] MRN: [id number] DOB: June 19, 1941    ADMISSION DATE:  07/28/2013 CONSULTATION DATE:  11/19  REFERRING MD :  Christella Hartigan PRIMARY SERVICE: TR[facility] >> PCCM  CHIEF COMPLAINT: intractable vomiting. Intubated to protect airway  BRIEF PATIENT DESCRIPTION:  69 F with large HH adm 11/18 by [facility] with intractable vomiting and concern for UGIB. Underwent EGD 11/19 revealing possible gastric volvulus. Intubated after EGD to protect airway and permit completion of eval.   SIGNIFICANT EVENTS / STUDIES:  11/19 EGD: stomach was filled with solid brown, feculent appearing material. The gastric anatomy was very abnormal, distorted and twisted 11/19 CT chest/abd/pelvis:  11-20 Exp lap (Rosenbower): Large hiatal hernia, gastric outlet obstruction, gastric volvulus, bezoar, esophageal perforation. PROCEDURE: lysis of adhesions, reduction of hiatal hernia, repair of esophageal perforation, Dor fundoplication, evacuation of gastric bezoar, placement of the gastrostomy-jejunostomy tube   LINES / TUBES: ETT 11/19 >> 11-21 11-19 RUE PICC >>  11-20 GJ tube >>    CULTURES: MRSA PCR 11/18 >> NEG  ANTIBIOTICS: Cefazolin 11/20 (peri-op) >>    SUBJECTIVE:  Passed SBT. Extubated and looks good post ext.    VITAL SIGNS: Temp:  [98.2 F (36.8 C)-99.3 F (37.4 C)] 98.2 F (36.8 C) (11/21 0400) Pulse Rate:  [37-141] 111 (11/21 0700) Resp:  [14-28] 14 (11/21 0700) BP: (87-148)/(64-97) 109/75 mmHg (11/21 0700) SpO2:  [91 %-100 %] 99 % (11/21 0700) FiO2 (%):  [40 %] 40 % (11/21 0747) Weight:  [106 lb 4.2 oz (48.2 kg)] 106 lb 4.2 oz (48.2 kg) (11/21 0540) HEMODYNAMICS: CVP:  [6 mmHg-8 mmHg] 8 mmHg VENTILATOR SETTINGS: Vent Mode:  [-] CPAP FiO2 (%):  [40 %] 40 % Set Rate:  [14 bmp] 14 bmp Vt Set:  [400 mL] 400 mL PEEP:  [5 cmH20] 5 cmH20 Pressure Support:  [8 cmH20] 8 cmH20 Plateau Pressure:  [18 cmH20-20 cmH20] 19 cmH20 INTAKE / OUTPUT: Intake/Output   11/20 0701 - 11/21 0700 11/21 0701 - 11/22 0700   I.V. (mL/kg) 4377 (90.8)    Blood 325    Other 40    IV Piggyback 2653.3    TPN 637.6    Total Intake(mL/kg) 8032.9 (166.7)    Urine (mL/kg/hr) 525 (0.5)    Emesis/NG output 150 (0.1)    Drains 925 (0.8)    Blood 150 (0.1)    Total Output 1750     Net +6282.9            PHYSICAL EXAMINATION: General:  RASS 0, + F/C Neuro: no focal deficits HEENT: WNL Cardiovascular: RRR s M  Lungs: clear anteriorly Abdomen: nondistended, minimal BS Ext: warm, no edema  LABS: I have reviewed all of today's lab results. Relevant abnormalities are discussed in the A/P section  CXR:  NACPD  ASSESSMENT / PLAN:  PULMONARY A: Acute respiratory failure  Compromised airway - resolved P:   Extubate today (done) Monitor in ICU post extubation  CARDIOVASCULAR A: No acute issues        P:  Monitor  RENAL A:  Hypokalemia, resolved Elevated BUN Olguria       P:   Monitor BMET intermittently Correct electrolytes as indicated   GASTROINTESTINAL A:   Large HH  Gastric outlet obstruction Protein-calorie malnutrition S/P laparotomy 11/20 P:   Cont TPN Post op mgmt per CCS  HEMATOLOGIC A:   Anemia P:  DVT px: SCDs Continue to monitor CBC  Transfuse for HBG <7   INFECTIOUS A:  No issues P:   CCS to manage peri-op abx Micro and abx as above  ENDOCRINE CBG (last 3)  A:   Mild Hyperglycemia due to TPN without hx of DM P:   Cont CBGs/SSI while on TPN  NEUROLOGIC A:   Acute encephalopathy after EGD, resolved  Post op pain P:   DC Precedex gtt PRN fentanyl   Brett Canales Minor ACNP Adolph Pollack PCCM Pager [phone] till 3 pm If no answer page [phone] 07/31/2013, 9:20 AM  PCCM ATTENDING: I have interviewed and examined the patient and reviewed the database. I have formulated the assessment and plan as reflected in the note above with amendments made by me. 35 mins of direct critical care time provided  Billy Fischer,  MD;  PCCM service; Mobile [phone]

## 2013-07-31 NOTE — Progress Notes (Addendum)
PARENTERAL NUTRITION CONSULT NOTE - follow up  Pharmacy Consult for TNA Indication: Hiatal hernia, unable to use enteral nutrition  Allergies  Allergen Reactions  . Codeine     REACTION: Dlizzy   Patient Measurements: Height: 4\' 7"  (139.7 cm) Weight: 106 lb 4.2 oz (48.2 kg) IBW/kg (Calculated) : 34 Usual Weight: 98lb (44.5kg)  Vital Signs:  Temp: 98.2 F (36.8 C) (11/21 0400) Temp src: Axillary (11/21 0400) BP: 128/85 mmHg (11/21 1000) Pulse Rate: 120 (11/21 1000) Intake/Output from previous day: 11/20 0701 - 11/21 0700 In: 8166.9 [I.V.:4482; Blood:325; IV Piggyback:2653.3; TPN:666.6] Out: 1750 [Urine:525; Emesis/NG output:150; Drains:925; Blood:150] Intake/Output from this shift: Total I/O In: 352 [I.V.:235; NG/GT:30; TPN:87] Out: 66 [Urine:16; Drains:50]  Labs:  Recent Labs  07/30/13 0440 07/30/13 1352 07/30/13 2246 07/31/13 0630  WBC 5.9  --  9.3 12.8*  HGB 8.8* 12.2 12.0 11.7*  HCT 26.5* 36.0 35.6* 34.9*  PLT 154  --  163 179    Recent Labs  07/29/13 0320 07/29/13 2033 07/30/13 0440 07/30/13 1352 07/30/13 2246 07/31/13 0630  NA 143  --  144 145 140 139  K 3.5  --  3.4* 4.2 4.1 4.3  CL 106  --  109  --  112 112  CO2 28  --  28  --  21 20  GLUCOSE 208*  --  145* 195* 171* 180*  BUN 37*  --  31*  --  29* 32*  CREATININE 0.91  --  0.67  --  0.71 0.84  CALCIUM 8.4  --  8.2*  --  7.1* 7.1*  MG  --   --  2.0  --   --   --   PHOS  --   --  2.0*  --   --   --   PROT  --   --  4.9*  --   --   --   ALBUMIN  --   --  2.4*  --   --   --   AST  --   --  15  --   --   --   ALT  --   --  9  --   --   --   ALKPHOS  --   --  59  --   --   --   BILITOT  --   --  0.4  --   --   --   PREALBUMIN  --  9.9* 9.6*  --   --   --   TRIG  --   --  87  --   --   --    Estimated Creatinine Clearance: 37.9 ml/min (by C-G formula based on Cr of 0.84).    Recent Labs  07/31/13 0003 07/31/13 0324 07/31/13 0742  GLUCAP 132* 146* 128*   Insulin Requirements in the  past 24 hours:  No hx DM, currently on sensitive Novolog scale q4hr CBG avg < 160, required 11 units Novolog past 24 hr   Current Nutrition:  TNA E 5/20 at 70ml/hr, Lipids 20% at 4 ml/hr Begin Tube feed thru jejunostomy tube: Vital AF 1.2 at 10 ml/hr with advancement to goal rate 45 ml/hr  Fluids:  NS @ reduced to 50 ml/mr  Labs:  Renal: SCr wnl, UOP  ~0.6 ml/kg/hr Lytes: K 4.3,  Corrected Ca 7.9 (low), other lytes ok Endo: CBGs < 160 Hepatic: LFTs WNL Albumin decreased to 2.4, prealbumin 9.6  Triglycerides: 87 on 11/20  Lines: PICC triple lumen (  11/19) Peripheral IV L and R forearm ( 11/18) NG/OG tube  Assessment: 12 yoF with long history of hiatal hernia s/p nissen fundoplication presents with intractable vomiting and concern for UGIB. 11/18 EGD showed possible gastric volvulus with feculant material in esophagus.  11/19 CT abd/pelvis/chest findings did not suggest gastric volvulus, but did confirm large hiatal hernia with gastric outlet obstruction.  Pt was electively placed on mechanical ventilator on 11/18 following EGD to prevent aspiration and currently remains intubated and sedated.  Plans for open repair of hiatal hernia for either Thursday or Friday.  Pt unable to receive enteral feedings and TNA has been ordered per pharmacy.  11/20:  To OR today for [facility] repair surgery. No issues with TNA per RN.  Phosphorus and potassium low, replaced already.  11/21: Lytes wnl, but Mag & Phos levels d/c by MD. UOP acceptable, CBG's acceptable. Total IV rate ~ 75 ml/hr  Nutritional Goals:  RD assessment 11/19: 1000 kCal, 60-70 grams of protein, 1L fluid per day Clinimix E5/20 @ goal rate of 35 ml/hr with lipids @ 62ml/hr will provide  ~1000 Kcal/day with 42 grams of protein  Plan:  At 1800 today:   Continue Clinimix E 5/20 at 25 ml/hr and continue 20% fat emulsion at 4 ml/hr (provides 701 Kcal/day, 30g protein)  Plan to advance tube feeding as tolerated to the goal rate and wean  TNA  TNA to contain standard multivitamins and trace elements.  Continue SSI q 4 h  TNA lab panels on Mondays & Thursdays.  Recheck Magnesium & Phosphorus with AM labs   Otho Bellows PharmD Pager [phone] 07/31/2013, 11:03 AM

## 2013-07-31 NOTE — Progress Notes (Signed)
eLink Physician-Brief Progress Note Patient Name: [name] DOB: October 25, 1940 MRN: [id number]  Date of Service  07/31/2013   HPI/Events of Note  Patient with tachycardia and hypotension - check on Hgb showed this was stable at 12.0.  Now with BP of 85/70 (73) and oliguria.  Had responded to fluid bolus earlier with decrease in tachycardia from the 130s to 110.  CVP post bolus is 8.   eICU Interventions  Plan: 500 cc NS bolus for ongoing hypotension and oliguria.   Intervention Category Intermediate Interventions: Hypotension - evaluation and management;Oliguria - evaluation and management  DETERDING,ELIZABETH 07/31/2013, 2:36 AM

## 2013-07-31 NOTE — Progress Notes (Signed)
NUTRITION FOLLOW UP  Intervention:   TPN wean; discussed with Pharmacy to start decreasing TPN after pt reaches goal rate of tube feeds Initiate Vital AF 1.2 @ 10 ml/hr via J-tube and increase by 10 ml every 12 hours to goal rate of 35 ml/hr.  At goal rate, tube feeding regimen will provide 1008 kcal, 63 grams of protein, and 681 ml of H2O.  (When extubated, goal rate of tube feeding will increase to 45 ml/hr to provide 1296 kcal, 81 grams of protein and 876 ml of H2O.) RD to continue to monitor nutrition care plan  Nutrition Dx:   Inadequate oral intake related to inability to eat/mechanical ventilation as evidenced by NPO; ongoing  Goal:   Pt to meet >/= 90% of their estimated nutrition needs; not yet met  Monitor:   TPN rate TF initiation/tolerance Weight Labs  Assessment:   72 year old female dmitted from home with c/o nausea, vomiting, and coffee ground emesis that stated Saturday PTA. Hx of large hiatal hernia, GI bleeding, diverticulosis, GERD, IBS, HTN, and nissen fundoplication. On Creon with meals, vitamin D, and Benefiber at home. Found to have upper GI bleed. EGD  showed the stomach was filled with solid brown, feculent appearing material; gastric anatomy was very abnormal, distorted and twisted. Pt underwent exploratory laparotomy, lysis of adhesions (2 hours), reduction of hiatal hernia, repair of esophageal perforation, Dor fundoplication, evacuation of gastric bezoar, placement of the gastrostomy-jejunostomy tube 11/20.   RD consulted to initiate tube feeding through J-tube; per MD note start at low rate. Pt awake and alert on vent at time of visit; per chart pt is weanable. Output from G-tube approximately 225 ml this morning per nursing notes. TPN: Clinamix E 5/20 currently running at 25 ml/hr with lipids at 4 ml/hr; providing 610 kcal and 30 grams of protein.    Height: Ht Readings from Last 1 Encounters:  07/30/13 4\' 7"  (1.397 m)    Weight Status:   Wt Readings  from Last 1 Encounters:  07/31/13 106 lb 4.2 oz (48.2 kg)   Patient is currently intubated on ventilator support.  MV: 6.2 L/min Temp:Temp (24hrs), Avg:98.8 F (37.1 C), Min:98.2 F (36.8 C), Max:99.3 F (37.4 C)   Re-estimated needs:  Kcal: 1032 (Goal: 1340-1550 once extubated) Protein: 65-75 grams Fluid: 1.3-1.5 L/day  Skin: abdominal incision with closed system drain  Diet Order: NPO   Intake/Output Summary (Last 24 hours) at 07/31/13 1006 Last data filed at 07/31/13 0900  Gross per 24 hour  Intake 7771.56 ml  Output   1681 ml  Net 6090.56 ml    Last BM: PTA   Labs:   Recent Labs Lab 07/29/13 0320 07/30/13 0440 07/30/13 1352 07/30/13 2246 07/31/13 0630  NA 143 144 145 140 139  K 3.5 3.4* 4.2 4.1 4.3  CL 106 109  --  112 112  CO2 28 28  --  21 20  BUN 37* 31*  --  29* 32*  CREATININE 0.91 0.67  --  0.71 0.84  CALCIUM 8.4 8.2*  --  7.1* 7.1*  MG  --  2.0  --   --   --   PHOS  --  2.0*  --   --   --   GLUCOSE 208* 145* 195* 171* 180*    CBG (last 3)   Recent Labs  07/31/13 0003 07/31/13 0324 07/31/13 0742  GLUCAP 132* 146* 128*    Scheduled Meds: . antiseptic oral rinse  15 mL Mouth Rinse  QID  . carboxymethylcellulose  1 drop Both Eyes TID WC & HS  .  ceFAZolin (ANCEF) IV  1 g Intravenous Q8H  . chlorhexidine  15 mL Mouth Rinse BID  . CVS EYE LUBRICANT NIGHTTIME  1 drop Ophthalmic QHS  . haloperidol lactate  5 mg Intravenous Q6H  . insulin aspart  0-15 Units Subcutaneous Q4H  . pantoprazole (PROTONIX) IV  40 mg Intravenous Q24H  . sodium chloride  10-40 mL Intracatheter Q12H  . Tafluprost  1 drop Ophthalmic QHS  . timolol  1 drop Both Eyes BID    Continuous Infusions: . sodium chloride 100 mL/hr at 07/30/13 1605  . dexmedetomidine 0.5 mcg/kg/hr (07/30/13 0053)  . Marland KitchenTPN (CLINIMIX-E) Adult 25 mL/hr at 07/30/13 1748   And  . fat emulsion 250 mL (07/30/13 1748)  . fentaNYL infusion INTRAVENOUS 50 mcg/hr (07/31/13 0007)    Ian Malkin RD, LDN Inpatient Clinical2 Dietitian Pager: [phone] After Hours Pager: [phone]

## 2013-07-31 NOTE — Progress Notes (Signed)
1 Day Post-Op  Subjective: Sedated on vent.  Objective: Vital signs in last 24 hours: Temp:  [97.8 F (36.6 C)-99.3 F (37.4 C)] 98.2 F (36.8 C) (11/21 0400) Pulse Rate:  [37-141] 111 (11/21 0700) Resp:  [14-28] 14 (11/21 0700) BP: (87-148)/(64-97) 109/75 mmHg (11/21 0700) SpO2:  [91 %-100 %] 99 % (11/21 0700) FiO2 (%):  [40 %] 40 % (11/21 0348) Weight:  [106 lb 4.2 oz (48.2 kg)] 106 lb 4.2 oz (48.2 kg) (11/21 0540) Last BM Date:  (pta)  Intake/Output from previous day: 11/20 0701 - 11/21 0700 In: 7684.9 [I.V.:4377; Blood:325; IV Piggyback:2653.3; TPN:289.6] Out: 1750 [Urine:525; Emesis/NG output:150; Drains:925; Blood:150] Intake/Output this shift:    PE: General- In NAD Abdomen-soft, dried drainage on dressing, thin serosanguinous drain output, thin dark g-tube output  Lab Results:   Recent Labs  07/30/13 2246 07/31/13 0630  WBC 9.3 12.8*  HGB 12.0 11.7*  HCT 35.6* 34.9*  PLT 163 179   BMET  Recent Labs  07/30/13 2246 07/31/13 0630  NA 140 139  K 4.1 4.3  CL 112 112  CO2 21 20  GLUCOSE 171* 180*  BUN 29* 32*  CREATININE 0.71 0.84  CALCIUM 7.1* 7.1*   PT/INR  Recent Labs  07/28/13 0900  LABPROT 14.4  INR 1.14   Comprehensive Metabolic Panel:    Component Value Date/Time   NA 139 07/31/2013 0630   K 4.3 07/31/2013 0630   CL 112 07/31/2013 0630   CO2 20 07/31/2013 0630   BUN 32* 07/31/2013 0630   CREATININE 0.84 07/31/2013 0630   GLUCOSE 180* 07/31/2013 0630   CALCIUM 7.1* 07/31/2013 0630   AST 15 07/30/2013 0440   ALT 9 07/30/2013 0440   ALKPHOS 59 07/30/2013 0440   BILITOT 0.4 07/30/2013 0440   PROT 4.9* 07/30/2013 0440   ALBUMIN 2.4* 07/30/2013 0440     Studies/Results: Ct Chest W Contrast  07/29/2013   CLINICAL DATA:  Large hiatal hernia. History of aspiration. Abdominal pain, nausea and vomiting. Evaluate for potential gastric volvulus.  EXAM: CT CHEST, ABDOMEN, AND PELVIS WITH CONTRAST  TECHNIQUE: Multidetector CT imaging of  the chest, abdomen and pelvis was performed following the standard protocol during bolus administration of intravenous contrast.  CONTRAST:  80mL OMNIPAQUE IOHEXOL 300 MG/ML  SOLN  COMPARISON:  CT of the abdomen and pelvis 02/12/2008.  FINDINGS: CT CHEST FINDINGS  Mediastinum: Large hiatal hernia again noted. No evidence gastric volvulus at this time. Nasogastric tube extends into the distal stomach. The patient is intubated, with the tip of the endotracheal tube at the level the carina directed toward the right mainstem bronchus. Heart size is normal. There is no significant pericardial fluid, thickening or pericardial calcification. No pathologically enlarged mediastinal or hilar lymph nodes.  Lungs/Pleura: Trace right and small left pleural effusions layering dependently are simple in appearance. No consolidative airspace disease. No pleural effusions. Areas of mild dependent atelectasis are noted throughout the lower lobes of the lungs bilaterally. No definite suspicious appearing pulmonary nodules or masses.  Musculoskeletal: Severe levoscoliosis of the thoracolumbar spine convex to the left at the thoracolumbar junction. There are no aggressive appearing lytic or blastic lesions noted in the visualized portions of the skeleton.  CT ABDOMEN AND PELVIS FINDINGS  Abdomen/Pelvis: The gallbladder appears moderately distended, and there is a small amount of pericholecystic fluid best demonstrated on image 67 of series 2. However, the gallbladder wall does not appear thickened, and there are no definite high attenuation gallstones identified at this time.  Common bile duct is normal in caliber. Pancreatic duct is not dilated. No intrahepatic biliary ductal dilatation. The appearance of the liver, pancreas, spleen, left adrenal gland and right kidney are unremarkable. The patient's right adrenal gland cannot be confidently identified secondary to her altered anatomy. Sub-cm low-attenuation lesion in the interpolar  region of the left kidney is too small to definitively characterize, but is statistically likely a tiny cyst.  No significant volume of ascites. No pneumoperitoneum. No pathologic distention of small bowel. No definite lymphadenopathy is confidently identified within the abdomen or pelvis. Mild atherosclerosis throughout the abdominal and pelvic vasculature without definite aneurysm or dissection. Surgical clips around the esophageal hiatus just beneath the diaphragm. Foley balloon catheter with tip in the lumen of the urinary bladder. Nondependent gas in the lumen of the urinary bladder is presumably iatrogenic. Urinary bladder is otherwise unremarkable in appearance. Status post hysterectomy. Ovaries are not confidently identified may be surgically absent or atrophic.  Musculoskeletal: Severe levoscoliosis of the thoracolumbar spine redemonstrated, as discussed above. There are no aggressive appearing lytic or blastic lesions noted in the visualized portions of the skeleton.  IMPRESSION: 1. There is a large hiatal hernia, but there are no current findings to suggest gastric volvulus. 2. Low lying endotracheal tube. Tip of the endotracheal tube is currently at the carina directed toward the right mainstem bronchus. This could be withdrawn 4-5 cm for more optimal placement. 3. Mild distension of the gallbladder with some pericholecystic fluid. Despite this, there are no definite gallstones, and there is no definite gallbladder wall thickening. These findings are of uncertain etiology and significance, but if there is clinical concern for cholecystitis related to non radiopaque stones or acalculus cholecystitis, further evaluation with ultrasound examination may provide additional diagnostic information if clinically indicated. 4. Small left and trace right-sided pleural effusions are simple in appearance and layer dependently. 5. Severe levoscoliosis of the thoracolumbar spine. 6. Additional postoperative changes  and support apparatus, as above. The position of the endotracheal tube was called by telephone at the time of interpretation on 07/29/2013 at 12:47 PM to nurse Denny Peon (for Dr. Avel Peace), who verbally acknowledged these results, and indicated that the endotracheal tube had already been repositioned.   Electronically Signed   By: Trudie Reed M.D.   On: 07/29/2013 12:54   Ct Abdomen Pelvis W Contrast  07/29/2013   CLINICAL DATA:  Large hiatal hernia. History of aspiration. Abdominal pain, nausea and vomiting. Evaluate for potential gastric volvulus.  EXAM: CT CHEST, ABDOMEN, AND PELVIS WITH CONTRAST  TECHNIQUE: Multidetector CT imaging of the chest, abdomen and pelvis was performed following the standard protocol during bolus administration of intravenous contrast.  CONTRAST:  80mL OMNIPAQUE IOHEXOL 300 MG/ML  SOLN  COMPARISON:  CT of the abdomen and pelvis 02/12/2008.  FINDINGS: CT CHEST FINDINGS  Mediastinum: Large hiatal hernia again noted. No evidence gastric volvulus at this time. Nasogastric tube extends into the distal stomach. The patient is intubated, with the tip of the endotracheal tube at the level the carina directed toward the right mainstem bronchus. Heart size is normal. There is no significant pericardial fluid, thickening or pericardial calcification. No pathologically enlarged mediastinal or hilar lymph nodes.  Lungs/Pleura: Trace right and small left pleural effusions layering dependently are simple in appearance. No consolidative airspace disease. No pleural effusions. Areas of mild dependent atelectasis are noted throughout the lower lobes of the lungs bilaterally. No definite suspicious appearing pulmonary nodules or masses.  Musculoskeletal: Severe levoscoliosis of the thoracolumbar spine  convex to the left at the thoracolumbar junction. There are no aggressive appearing lytic or blastic lesions noted in the visualized portions of the skeleton.  CT ABDOMEN AND PELVIS FINDINGS   Abdomen/Pelvis: The gallbladder appears moderately distended, and there is a small amount of pericholecystic fluid best demonstrated on image 67 of series 2. However, the gallbladder wall does not appear thickened, and there are no definite high attenuation gallstones identified at this time. Common bile duct is normal in caliber. Pancreatic duct is not dilated. No intrahepatic biliary ductal dilatation. The appearance of the liver, pancreas, spleen, left adrenal gland and right kidney are unremarkable. The patient's right adrenal gland cannot be confidently identified secondary to her altered anatomy. Sub-cm low-attenuation lesion in the interpolar region of the left kidney is too small to definitively characterize, but is statistically likely a tiny cyst.  No significant volume of ascites. No pneumoperitoneum. No pathologic distention of small bowel. No definite lymphadenopathy is confidently identified within the abdomen or pelvis. Mild atherosclerosis throughout the abdominal and pelvic vasculature without definite aneurysm or dissection. Surgical clips around the esophageal hiatus just beneath the diaphragm. Foley balloon catheter with tip in the lumen of the urinary bladder. Nondependent gas in the lumen of the urinary bladder is presumably iatrogenic. Urinary bladder is otherwise unremarkable in appearance. Status post hysterectomy. Ovaries are not confidently identified may be surgically absent or atrophic.  Musculoskeletal: Severe levoscoliosis of the thoracolumbar spine redemonstrated, as discussed above. There are no aggressive appearing lytic or blastic lesions noted in the visualized portions of the skeleton.  IMPRESSION: 1. There is a large hiatal hernia, but there are no current findings to suggest gastric volvulus. 2. Low lying endotracheal tube. Tip of the endotracheal tube is currently at the carina directed toward the right mainstem bronchus. This could be withdrawn 4-5 cm for more optimal  placement. 3. Mild distension of the gallbladder with some pericholecystic fluid. Despite this, there are no definite gallstones, and there is no definite gallbladder wall thickening. These findings are of uncertain etiology and significance, but if there is clinical concern for cholecystitis related to non radiopaque stones or acalculus cholecystitis, further evaluation with ultrasound examination may provide additional diagnostic information if clinically indicated. 4. Small left and trace right-sided pleural effusions are simple in appearance and layer dependently. 5. Severe levoscoliosis of the thoracolumbar spine. 6. Additional postoperative changes and support apparatus, as above. The position of the endotracheal tube was called by telephone at the time of interpretation on 07/29/2013 at 12:47 PM to nurse Denny Peon (for Dr. Avel Peace), who verbally acknowledged these results, and indicated that the endotracheal tube had already been repositioned.   Electronically Signed   By: Trudie Reed M.D.   On: 07/29/2013 12:54   Dg Chest Port 1 View  07/30/2013   CLINICAL DATA:  Respiratory failure.  EXAM: PORTABLE CHEST - 1 VIEW  COMPARISON:  CT 07/29/2013.  Chest x-ray 07/29/2013.  FINDINGS: Endotracheal tube has been withdrawn, its tip is now in good anatomic position 3 cm above the carina. NG tube noted projected over the stomach. PICC line with tip projected over the right atrium. There is a large sliding hiatal hernia again noted. Cardiac structures are difficult to evaluate due to patient rotation and large sliding hiatal hernia. Poor lung volumes. Small bilateral pleural effusions. No pneumothorax. No acute osseous lesions. Surgical clips left upper abdomen.  IMPRESSION: 1. Interim withdrawal of endotracheal tube. Its tip is in good anatomic position 3 cm above the carina.  PICC line and NG tube in good anatomic position. 2. Poor lung volumes with small bilateral pleural effusions. 3. Large hiatal hernia.    Electronically Signed   By: Maisie Fus  Register   On: 07/30/2013 07:14    Anti-infectives: Anti-infectives   Start     Dose/Rate Route Frequency Ordered Stop   07/30/13 1700  ceFAZolin (ANCEF) IVPB 1 g/50 mL premix    Comments:  In OR holding   1 g 100 mL/hr over 30 Minutes Intravenous 3 times per day 07/30/13 0722     07/30/13 0900  ceFAZolin (ANCEF) IVPB 1 g/50 mL premix     1 g 100 mL/hr over 30 Minutes Intravenous  Once 07/30/13 9528 07/30/13 0935      Assessment Exploratory laparotomy, lysis of adhesions (2 hours), reduction of hiatal hernia, repair of esophageal perforation, Dor fundoplication, evacuation of gastric bezoar, placement of the gastrostomy-jejunostomy tube. (Upper endoscopy by Dr. [name]) on 07/2013  Hypovolemic overnight-responding to IV fluid boluses   LOS: 3 days   Plan: Okay to start feeding through jejunal port at low rate.  Suspect she made need more volume today.   [name] 07/31/2013

## 2013-08-01 ENCOUNTER — Inpatient Hospital Stay (HOSPITAL_COMMUNITY): Payer: Medicare Other

## 2013-08-01 LAB — TYPE AND SCREEN
ABO/RH(D): O POS
Antibody Screen: NEGATIVE
Unit division: 0
Unit division: 0

## 2013-08-01 LAB — MAGNESIUM: Magnesium: 1.8 mg/dL (ref 1.5–2.5)

## 2013-08-01 LAB — GLUCOSE, CAPILLARY
Glucose-Capillary: 153 mg/dL — ABNORMAL HIGH (ref 70–99)
Glucose-Capillary: 121 mg/dL — ABNORMAL HIGH (ref 70–99)

## 2013-08-01 LAB — CBC
Hemoglobin: 9.1 g/dL — ABNORMAL LOW (ref 12.0–15.0)
MCH: 28.8 pg (ref 26.0–34.0)
MCHC: 32.6 g/dL (ref 30.0–36.0)
MCV: 88.3 fL (ref 78.0–100.0)
RDW: 13.8 % (ref 11.5–15.5)

## 2013-08-01 LAB — BASIC METABOLIC PANEL
BUN: 33 mg/dL — ABNORMAL HIGH (ref 6–23)
CO2: 23 mEq/L (ref 19–32)
Calcium: 8.1 mg/dL — ABNORMAL LOW (ref 8.4–10.5)
Creatinine, Ser: 0.67 mg/dL (ref 0.50–1.10)
GFR calc Af Amer: >90 mL/min (ref >90)
GFR calc non Af Amer: 86 mL/min — ABNORMAL LOW (ref >90)
Glucose, Bld: 138 mg/dL — ABNORMAL HIGH (ref 70–99)
Potassium: 3.6 mEq/L (ref 3.5–5.1)
Sodium: 142 mEq/L (ref 135–145)

## 2013-08-01 MED ORDER — POTASSIUM CHLORIDE 10 MEQ/100ML IV SOLN
10.0000 meq | INTRAVENOUS | Status: AC
  Administered 2013-08-01 (×2): 10 meq via INTRAVENOUS
  Filled 2013-08-01 (×2): qty 100

## 2013-08-01 MED ORDER — IOHEXOL 300 MG/ML  SOLN
50.0000 mL | Freq: Once | INTRAMUSCULAR | Status: AC | PRN
  Administered 2013-08-01: 50 mL via ORAL

## 2013-08-01 MED ORDER — FAT EMULSION 20 % IV EMUL
250.0000 mL | INTRAVENOUS | Status: DC
  Administered 2013-08-01: 250 mL via INTRAVENOUS
  Filled 2013-08-01: qty 250

## 2013-08-01 MED ORDER — TRACE MINERALS CR-CU-F-FE-I-MN-MO-SE-ZN IV SOLN
INTRAVENOUS | Status: DC
  Administered 2013-08-01: 18:00:00 via INTRAVENOUS
  Filled 2013-08-01: qty 1000

## 2013-08-01 MED ORDER — MAGNESIUM SULFATE 40 MG/ML IJ SOLN
2.0000 g | Freq: Once | INTRAMUSCULAR | Status: AC
  Administered 2013-08-01: 2 g via INTRAVENOUS
  Filled 2013-08-01: qty 50

## 2013-08-01 NOTE — Progress Notes (Signed)
PARENTERAL NUTRITION CONSULT NOTE - follow up  Pharmacy Consult for TNA Indication: Hiatal hernia, unable to use enteral nutrition  Allergies  Allergen Reactions  . Codeine     REACTION: Dlizzy   Patient Measurements: Height: 4\' 7"  (139.7 cm) Weight: 110 lb 3.7 oz (50 kg) IBW/kg (Calculated) : 34 Usual Weight: 98lb (44.5kg)  Vital Signs:  Temp: 97.8 F (36.6 C) (11/22 0800) Temp src: Oral (11/22 0800) BP: 126/82 mmHg (11/22 0800) Pulse Rate: 114 (11/22 0800) Intake/Output from previous day: 11/21 0701 - 11/22 0700 In: 2298 [I.V.:1275; NG/GT:280; IV Piggyback:100; TPN:643] Out: 821 [Urine:586; Drains:235] Intake/Output from this shift: Total I/O In: 50 [I.V.:50] Out: -   Labs:  Recent Labs  07/30/13 2246 07/31/13 0630 08/01/13 0530  WBC 9.3 12.8* 12.0*  HGB 12.0 11.7* 9.1*  HCT 35.6* 34.9* 27.9*  PLT 163 179 172    Recent Labs  07/29/13 2033  07/30/13 0440  07/30/13 2246 07/31/13 0630 08/01/13 0530  NA  --   --  144  < > 140 139 142  K  --   --  3.4*  < > 4.1 4.3 3.6  CL  --   < > 109  --  112 112 114*  CO2  --   < > 28  --  21 20 23   GLUCOSE  --   --  145*  < > 171* 180* 138*  BUN  --   < > 31*  --  29* 32* 33*  CREATININE  --   < > 0.67  --  0.71 0.84 0.67  CALCIUM  --   < > 8.2*  --  7.1* 7.1* 8.1*  MG  --   --  2.0  --   --   --  1.8  PHOS  --   --  2.0*  --   --   --  2.3  PROT  --   --  4.9*  --   --   --   --   ALBUMIN  --   --  2.4*  --   --   --   --   AST  --   --  15  --   --   --   --   ALT  --   --  9  --   --   --   --   ALKPHOS  --   --  59  --   --   --   --   BILITOT  --   --  0.4  --   --   --   --   PREALBUMIN 9.9*  --  9.6*  --   --   --   --   TRIG  --   --  87  --   --   --   --   < > = values in this interval not displayed. Estimated Creatinine Clearance: 40.5 ml/min (by C-G formula based on Cr of 0.67).    Recent Labs  07/31/13 2301 08/01/13 0338 08/01/13 0747  GLUCAP 90 153* 121*   Insulin Requirements in the  past 24 hours:  No hx DM, currently on sensitive Novolog scale q4hr CBG avg < 130 (improved), required 6 units Novolog past 24 hr   Current Nutrition:  TNA E 5/20 at 95ml/hr, Lipids 20% at 4 ml/hr Tube feed thru jejunostomy tube: Vital AF 1.2 currently at 20 ml/hr with slow advancement to goal rate 45 ml/hr  Fluids:  NS @ reduced to 50 ml/mr  Labs:  Renal: SCr wnl, UOP  ~0.6 ml/kg/hr Lytes: Corrected Ca 8.9, Mag & Phos wnl Endo: CBGs < 130 Hepatic: LFTs WNL Albumin decreased to 2.4, prealbumin 9.6  Triglycerides: 87 on 11/20  Lines: PICC triple lumen (11/19) Peripheral IV L and R forearm ( 11/18) G tube to suction J tube for tube feeding  Assessment: 55 yoF with long history of hiatal hernia s/p nissen fundoplication presents with intractable vomiting and concern for UGIB. 11/18 EGD showed possible gastric volvulus with feculant material in esophagus.  11/19 CT abd/pelvis/chest findings did not suggest gastric volvulus, but did confirm large hiatal hernia with gastric outlet obstruction.  Pt was electively placed on mechanical ventilator on 11/18 following EGD to prevent aspiration and currently remains intubated and sedated.  Plans for open repair of hiatal hernia for either Thursday or Friday.  Pt unable to receive enteral feedings and TNA has been ordered per pharmacy.  11/20:  To OR today for [facility] repair surgery. No issues with TNA per RN.  Phosphorus and potassium low, replaced already.  11/21: Lytes wnl, but Mag & Phos levels d/c by MD. UOP acceptable, CBG's acceptable. Total IV rate ~ 75 ml/hr 11/22: lytes wnl, tolerating tube feed, CBG's improved.  Nutritional Goals:  RD assessment 11/19: 1000 kCal, 60-70 grams of protein, 1L fluid per day. Vital AF 1.2 goal rate 45 m/hr Clinimix E5/20 @ goal rate of 35 ml/hr with lipids @ 59ml/hr will provide  ~1000 Kcal/day with 42 grams of protein  Plan:  At 1800 today:   Continue Clinimix E 5/20 at 25 ml/hr and continue 20% fat  emulsion at 4 ml/hr (provides 701 Kcal/day, 30g protein)  Plan to advance tube feeding as tolerated to the goal rate and wean TNA  TNA to contain standard multivitamins and trace elements.  Continue SSI q 4 h  TNA lab panels on Mondays & Thursdays.  Otho Bellows PharmD Pager [phone] 08/01/2013, 9:01 AM

## 2013-08-01 NOTE — Progress Notes (Signed)
Jejunal limb of g-j tube has backed out of the small intestine and is in the stomach.  Tube feeds are also coming out of g-tube.  Will stop feeds.  Do not want feeding tube manipulated at this time.  TPN for nutrition.

## 2013-08-01 NOTE — Progress Notes (Signed)
2 Days Post-Op  Subjective: Extubated.  Has incisional soreness.  No flatus or BM.  Objective: Vital signs in last 24 hours: Temp:  [97.3 F (36.3 C)-98.2 F (36.8 C)] 98.2 F (36.8 C) (11/22 0400) Pulse Rate:  [90-120] 116 (11/22 0600) Resp:  [15-28] 24 (11/22 0600) BP: (109-145)/(64-106) 126/78 mmHg (11/22 0600) SpO2:  [95 %-100 %] 96 % (11/22 0600) FiO2 (%):  [40 %] 40 % (11/21 0900) Weight:  [110 lb 3.7 oz (50 kg)] 110 lb 3.7 oz (50 kg) (11/22 0400) Last BM Date:  (pta)  Intake/Output from previous day: 11/21 0701 - 11/22 0700 In: 2248 [I.V.:1225; NG/GT:280; IV Piggyback:100; TPN:643] Out: 821 [Urine:586; Drains:235] Intake/Output this shift:    PE: General- In NAD Abdomen-soft, dried drainage on dressing, thin serosanguinous drain output, hypoactive bowel sounds  Lab Results:   Recent Labs  07/31/13 0630 08/01/13 0530  WBC 12.8* 12.0*  HGB 11.7* 9.1*  HCT 34.9* 27.9*  PLT 179 172   BMET  Recent Labs  07/31/13 0630 08/01/13 0530  NA 139 142  K 4.3 3.6  CL 112 114*  CO2 20 23  GLUCOSE 180* 138*  BUN 32* 33*  CREATININE 0.84 0.67  CALCIUM 7.1* 8.1*   PT/INR No results found for this basename: LABPROT, INR,  in the last 72 hours Comprehensive Metabolic Panel:    Component Value Date/Time   NA 142 08/01/2013 0530   K 3.6 08/01/2013 0530   CL 114* 08/01/2013 0530   CO2 23 08/01/2013 0530   BUN 33* 08/01/2013 0530   CREATININE 0.67 08/01/2013 0530   GLUCOSE 138* 08/01/2013 0530   CALCIUM 8.1* 08/01/2013 0530   AST 15 07/30/2013 0440   ALT 9 07/30/2013 0440   ALKPHOS 59 07/30/2013 0440   BILITOT 0.4 07/30/2013 0440   PROT 4.9* 07/30/2013 0440   ALBUMIN 2.4* 07/30/2013 0440     Studies/Results: Dg Chest Port 1 View  07/31/2013   CLINICAL DATA:  Endotracheal tube  EXAM: PORTABLE CHEST - 1 VIEW  COMPARISON:  07/30/2013  FINDINGS: Endotracheal tube tip 6 cm above the carina.  Nasogastric tube courses below the diaphragm. Tip not imaged on the  current exam.  Right central line tip proximal right atrium level.  No gross pneumothorax.  Pulmonary edema with pleural effusions and basilar atelectasis suspected.  Large hiatal hernia.  Cardiomegaly.  Tortuous aorta.  Postsurgical changes within the abdomen.  IMPRESSION: Endotracheal tube tip now 6 cm above the carina. Remainder of findings without significant change as detailed above.   Electronically Signed   By: Bridgett Larsson M.D.   On: 07/31/2013 07:49    Anti-infectives: Anti-infectives   Start     Dose/Rate Route Frequency Ordered Stop   07/30/13 1700  ceFAZolin (ANCEF) IVPB 1 g/50 mL premix    Comments:  In OR holding   1 g 100 mL/hr over 30 Minutes Intravenous 3 times per day 07/30/13 0722     07/30/13 0900  ceFAZolin (ANCEF) IVPB 1 g/50 mL premix     1 g 100 mL/hr over 30 Minutes Intravenous  Once 07/30/13 1478 07/30/13 0935      Assessment Exploratory laparotomy, lysis of adhesions (2 hours), reduction of hiatal hernia, repair of esophageal perforation, Dor fundoplication, evacuation of gastric bezoar, placement of the gastrostomy-jejunostomy tube, upper endoscopy on 07/2013-tolerating extubation well; tube feedings started through jejunal port     LOS: 4 days   Plan: G-tube to LIWS-do not want any gastric distension. Strict NPO.  Gastrograffin  swallow on POD #5.   [name] 08/01/2013

## 2013-08-01 NOTE — Evaluation (Signed)
Physical Therapy Evaluation Patient Details Name: [name] MRN: [id number] DOB: 1941/05/12 Today's Date: 08/01/2013 Time: 7829-5621 PT Time Calculation (min): 33 min  PT Assessment / Plan / Recommendation History of Present Illness  7 F with large HH adm 11/18 by [facility] with intractable vomiting and concern for UGIB. Underwent EGD 11/19 revealing possible gastric volvulus. Intubated after EGD to protect airway and permit completion of eval.11-20 Exp lap (Rosenbower): Large hiatal hernia, gastric outlet obstruction, gastric volvulus, bezoar, esophageal perforation. PROCEDURE: lysis of adhesions, reduction of hiatal hernia, repair of esophageal perforation, Dor fundoplication, evacuation of gastric bezoar, placement of the gastrostomy-jejunostomy tube  Clinical Impression  Pt will benefit from PT to address deficits below    PT Assessment  Patient needs continued PT services    Follow Up Recommendations  SNF    Does the patient have the potential to tolerate intense rehabilitation      Barriers to Discharge        Equipment Recommendations  Other (comment) (TBD)    Recommendations for Other Services     Frequency Min 3X/week    Precautions / Restrictions Precautions Precautions: Fall;Other (comment) Precaution Comments: abd incision; JP drain; JG tube/suction Restrictions Weight Bearing Restrictions: No   Pertinent Vitals/Pain BP 12572 RR 23 HR 106 Sats 94% on RA--O2 removed at Rn advice RN present  During PT session, pt with c/o abd pain--RN premedicated      Mobility  Bed Mobility Bed Mobility: Rolling Left;Left Sidelying to Sit Rolling Left: 2: Max assist Left Sidelying to Sit: 1: +1 Total assist Details for Bed Mobility Assistance: verbal and tactile cues for technique Transfers Transfers: Squat Pivot Transfers Squat Pivot Transfers: 1: +2 Total assist Squat Pivot Transfers: Patient Percentage: 30% Details for Transfer Assistance: +2 for line management,  safety    Exercises     PT Diagnosis: Difficulty walking  PT Problem List: Decreased strength;Decreased activity tolerance;Decreased balance;Decreased mobility;Decreased knowledge of use of DME PT Treatment Interventions: DME instruction;Gait training;Functional mobility training;Therapeutic activities;Therapeutic exercise;Patient/family education     PT Goals(Current goals can be found in the care plan section) Acute Rehab PT Goals Patient Stated Goal: to get back to mowing the yard PT Goal Formulation: With patient Time For Goal Achievement: 08/15/13 Potential to Achieve Goals: Good  Visit Information  Last PT Received On: 08/01/13 Assistance Needed: +2 History of Present Illness: 57 F with large HH adm 11/18 by [facility] with intractable vomiting and concern for UGIB. Underwent EGD 11/19 revealing possible gastric volvulus. Intubated after EGD to protect airway and permit completion of eval.11-20 Exp lap (Rosenbower): Large hiatal hernia, gastric outlet obstruction, gastric volvulus, bezoar, esophageal perforation. PROCEDURE: lysis of adhesions, reduction of hiatal hernia, repair of esophageal perforation, Dor fundoplication, evacuation of gastric bezoar, placement of the gastrostomy-jejunostomy tube       Prior Functioning  Home Living Family/patient expects to be discharged to:: Private residence Living Arrangements: Alone Type of Home: House Home Access: Stairs to enter Secretary/administrator of Steps: 1-2 Home Layout: One level Home Equipment: None Prior Function Level of Independence: Independent Communication Communication: No difficulties    Cognition  Cognition Arousal/Alertness: Awake/alert Behavior During Therapy: WFL for tasks assessed/performed Overall Cognitive Status: Within Functional Limits for tasks assessed    Extremity/Trunk Assessment Upper Extremity Assessment Upper Extremity Assessment: Generalized weakness Lower Extremity Assessment Lower Extremity  Assessment: Generalized weakness   Balance Static Sitting Balance Static Sitting - Balance Support: Bilateral upper extremity supported;No upper extremity supported;Feet supported Static Sitting - Level of  Assistance: 3: Mod assist;4: Min assist;5: Stand by assistance Static Sitting - Comment/# of Minutes: improved balance with time; sat EOB x 11 min;   End of Session PT - End of Session Activity Tolerance: Patient limited by fatigue;Patient limited by pain  GP     [facility] 08/01/2013, 10:56 AM

## 2013-08-01 NOTE — Progress Notes (Signed)
PULMONARY  / CRITICAL CARE MEDICINE  Name: [name] MRN: [id number] DOB: Jan 09, 1941    ADMISSION DATE:  07/28/2013 CONSULTATION DATE:  11/19  REFERRING MD :  Christella Hartigan PRIMARY SERVICE: [facility] >> PCCM  CHIEF COMPLAINT: intractable vomiting. Intubated to protect airway  BRIEF PATIENT DESCRIPTION:  78 F with large HH adm 11/18 by [facility] with intractable vomiting and concern for UGIB. Underwent EGD 11/19 revealing possible gastric volvulus. Intubated after EGD to protect airway and permit completion of eval.   SIGNIFICANT EVENTS / STUDIES:  11/19 EGD: stomach was filled with solid brown, feculent appearing material. The gastric anatomy was very abnormal, distorted and twisted 11/19 CT chest/abd/pelvis:  11-20 Exp lap (Rosenbower): Large hiatal hernia, gastric outlet obstruction, gastric volvulus, bezoar, esophageal perforation. PROCEDURE: lysis of adhesions, reduction of hiatal hernia, repair of esophageal perforation, Dor fundoplication, evacuation of gastric bezoar, placement of the gastrostomy-jejunostomy tube   LINES / TUBES: ETT 11/19 >> 11-21 11-19 RUE PICC >>  11-20 GJ tube >>    CULTURES: MRSA PCR 11/18 >> NEG  Recent Results (from the past 240 hour(s))  MRSA PCR SCREENING     Status: None   Collection Time    07/28/13  1:37 PM      Result Value Range Status   MRSA by PCR NEGATIVE  NEGATIVE Final   Comment:            The GeneXpert MRSA Assay (FDA     approved for NASAL specimens     only), is one component of a     comprehensive MRSA colonization     surveillance program. It is not     intended to diagnose MRSA     infection nor to guide or     monitor treatment for     MRSA infections.     ANTIBIOTICS: Cefazolin 11/20 (peri-op) >>  Anti-infectives   Start     Dose/Rate Route Frequency Ordered Stop   07/30/13 1700  ceFAZolin (ANCEF) IVPB 1 g/50 mL premix    Comments:  In OR holding   1 g 100 mL/hr over 30 Minutes Intravenous 3 times per day 07/30/13 0722      07/30/13 0900  ceFAZolin (ANCEF) IVPB 1 g/50 mL premix     1 g 100 mL/hr over 30 Minutes Intravenous  Once 07/30/13 0722 07/30/13 0935       SUBJECTIVE:    08/01/13: Extubated. Deconditioned. In pain : needing fent q2h but able to sit in chair. Oriiented and plesant  VITAL SIGNS: Temp:  [97.3 F (36.3 C)-98.2 F (36.8 C)] 97.8 F (36.6 C) (11/22 0800) Pulse Rate:  [90-120] 114 (11/22 0800) Resp:  [15-29] 29 (11/22 0800) BP: (109-145)/(64-106) 126/82 mmHg (11/22 0800) SpO2:  [95 %-100 %] 100 % (11/22 0824) Weight:  [50 kg (110 lb 3.7 oz)] 50 kg (110 lb 3.7 oz) (11/22 0400) HEMODYNAMICS:   VENTILATOR SETTINGS:   INTAKE / OUTPUT: Intake/Output     11/21 0701 - 11/22 0700 11/22 0701 - 11/23 0700   I.V. (mL/kg) 1275 (25.5) 50 (1)   Blood     Other     NG/GT 280    IV Piggyback 100    TPN 643    Total Intake(mL/kg) 2298 (46) 50 (1)   Urine (mL/kg/hr) 586 (0.5)    Emesis/NG output     Drains 235 (0.2)    Blood     Total Output 821     Net +1477 +50  PHYSICAL EXAMINATION: General:  RASS 0, + F/C. Deconditioned Neuro: no focal deficits. In abd pain. RASS 0. CAM-ICU negative for delirum HEENT: WNL Cardiovascular: RRR s M  Lungs: clear anteriorly Abdomen: dressing +, tender + nondistended, minimal BS Ext: warm, no edema   PULMONARY No results found for this basename: PHART, PCO2, PCO2ART, PO2, PO2ART, HCO3, TCO2, O2SAT,  in the last 168 hours  CBC  Recent Labs Lab 07/30/13 2246 07/31/13 0630 08/01/13 0530  HGB 12.0 11.7* 9.1*  HCT 35.6* 34.9* 27.9*  WBC 9.3 12.8* 12.0*  PLT 163 179 172    COAGULATION  Recent Labs Lab 07/28/13 0900  INR 1.14    CARDIAC  No results found for this basename: TROPONINI,  in the last 168 hours No results found for this basename: PROBNP,  in the last 168 hours   CHEMISTRY  Recent Labs Lab 07/29/13 0320 07/30/13 0440 07/30/13 1352 07/30/13 2246 07/31/13 0630 08/01/13 0530  NA 143 144 145 140 139 142   K 3.5 3.4* 4.2 4.1 4.3 3.6  CL 106 109  --  112 112 114*  CO2 28 28  --  21 20 23   GLUCOSE 208* 145* 195* 171* 180* 138*  BUN 37* 31*  --  29* 32* 33*  CREATININE 0.91 0.67  --  0.71 0.84 0.67  CALCIUM 8.4 8.2*  --  7.1* 7.1* 8.1*  MG  --  2.0  --   --   --  1.8  PHOS  --  2.0*  --   --   --  2.3   Estimated Creatinine Clearance: 40.5 ml/min (by C-G formula based on Cr of 0.67).   LIVER  Recent Labs Lab 07/28/13 0900 07/30/13 0440  AST 32 15  ALT 18 9  ALKPHOS 99 59  BILITOT 0.9 0.4  PROT 7.9 4.9*  ALBUMIN 4.2 2.4*  INR 1.14  --      INFECTIOUS  Recent Labs Lab 07/28/13 0911 07/31/13 0014  LATICACIDVEN 2.20 1.7     ENDOCRINE CBG (last 3)   Recent Labs  07/31/13 2301 08/01/13 0338 08/01/13 0747  GLUCAP 90 153* 121*         IMAGING x48h  Dg Chest Port 1 View  08/01/2013   CLINICAL DATA:  Hypertension, atelectasis.  EXAM: PORTABLE CHEST - 1 VIEW  COMPARISON:  July 31, 2013.  FINDINGS: Stable cardiomegaly. Endotracheal and nasogastric tubes have been removed. Moderate bilateral pleural effusions are noted. Left pleural effusion is enlarged compared to prior exam. Underlying pneumonia or atelectasis cannot be excluded. Stable right-sided PICC line.  IMPRESSION: Bilateral pleural effusions are noted. Left pleural effusion is increased compared to prior exam and underlying pneumonia or atelectasis cannot be excluded.   Electronically Signed   By: Roque Lias M.D.   On: 08/01/2013 07:46   Dg Chest Port 1 View  07/31/2013   CLINICAL DATA:  Endotracheal tube  EXAM: PORTABLE CHEST - 1 VIEW  COMPARISON:  07/30/2013  FINDINGS: Endotracheal tube tip 6 cm above the carina.  Nasogastric tube courses below the diaphragm. Tip not imaged on the current exam.  Right central line tip proximal right atrium level.  No gross pneumothorax.  Pulmonary edema with pleural effusions and basilar atelectasis suspected.  Large hiatal hernia.  Cardiomegaly.  Tortuous aorta.   Postsurgical changes within the abdomen.  IMPRESSION: Endotracheal tube tip now 6 cm above the carina. Remainder of findings without significant change as detailed above.   Electronically Signed   By: Brett Canales  Constance Goltz M.D.   On: 07/31/2013 07:49      ASSESSMENT / PLAN:  PULMONARY A: Acute respiratory failure  Compromised airway - resolved. Extubated 07/31/13  08/01/13: Maintaining airway and resp status  P:   IS  CARDIOVASCULAR A: No acute issues        P:  Monitor  RENAL A:  Making adeuate urine Mild Low K and Mild low Mag        P:   Replete K and mag: goak K > 4, goal Mag > 2 Monitor BMET intermittently Correct electrolytes as indicated   GASTROINTESTINAL A:   Large HH  Gastric outlet obstruction Protein-calorie malnutrition S/P laparotomy 11/20 P:   Cont TPN Post op mgmt per [facility]  HEMATOLOGIC A:   Anemia P:  DVT px: SCDs Continue to monitor CBC  Transfuse for HBG <7   INFECTIOUS A:  No issues P:   CCS to manage peri-op abx Micro and abx as above  ENDOCRINE CBG (last 3)  A:   Mild Hyperglycemia due to TPN without hx of DM P:   Cont CBGs/SSI while on TPN  NEUROLOGIC A:   Acute encephalopathy after EGD, resolved  Post op pain P:   RN directed prn fentanyl (patient decline pca fentanyl)     Dr. Kalman Shan, M.D., [facility].C.P Pulmonary and Critical Care Medicine Staff Physician [facility] [place] Pager: [phone], If no answer or between  15:00h - 7:00h: call [phone]  08/01/2013 9:34 AM

## 2013-08-02 LAB — CBC
Hemoglobin: 8.6 g/dL — ABNORMAL LOW (ref 12.0–15.0)
MCH: 29.5 pg (ref 26.0–34.0)
MCV: 88 fL (ref 78.0–100.0)
Platelets: 196 10*3/uL (ref 150–400)
RBC: 2.92 MIL/uL — ABNORMAL LOW (ref 3.87–5.11)
RDW: 13.5 % (ref 11.5–15.5)

## 2013-08-02 LAB — GLUCOSE, CAPILLARY
Glucose-Capillary: 108 mg/dL — ABNORMAL HIGH (ref 70–99)
Glucose-Capillary: 125 mg/dL — ABNORMAL HIGH (ref 70–99)
Glucose-Capillary: 106 mg/dL — ABNORMAL HIGH (ref 70–99)

## 2013-08-02 LAB — BASIC METABOLIC PANEL
BUN: 15 mg/dL (ref 6–23)
Chloride: 111 mEq/L (ref 96–112)
Creatinine, Ser: 0.39 mg/dL — ABNORMAL LOW (ref 0.50–1.10)
GFR calc Af Amer: >90 mL/min (ref >90)
Glucose, Bld: 127 mg/dL — ABNORMAL HIGH (ref 70–99)
Potassium: 3.2 mEq/L — ABNORMAL LOW (ref 3.5–5.1)
Sodium: 142 mEq/L (ref 135–145)

## 2013-08-02 MED ORDER — FAT EMULSION 20 % IV EMUL
250.0000 mL | INTRAVENOUS | Status: AC
  Filled 2013-08-02: qty 250

## 2013-08-02 MED ORDER — TRACE MINERALS CR-CU-F-FE-I-MN-MO-SE-ZN IV SOLN
INTRAVENOUS | Status: AC
  Administered 2013-08-02: 18:00:00 via INTRAVENOUS
  Filled 2013-08-02: qty 1000

## 2013-08-02 MED ORDER — TRACE MINERALS CR-CU-F-FE-I-MN-MO-SE-ZN IV SOLN
INTRAVENOUS | Status: AC
  Filled 2013-08-02: qty 1000

## 2013-08-02 MED ORDER — FAT EMULSION 20 % IV EMUL
250.0000 mL | INTRAVENOUS | Status: AC
  Administered 2013-08-02: 250 mL via INTRAVENOUS
  Filled 2013-08-02: qty 250

## 2013-08-02 MED ORDER — POTASSIUM CHLORIDE 10 MEQ/50ML IV SOLN
10.0000 meq | INTRAVENOUS | Status: AC
  Administered 2013-08-02 (×3): 10 meq via INTRAVENOUS
  Filled 2013-08-02 (×4): qty 50

## 2013-08-02 MED ORDER — MAGNESIUM SULFATE 40 MG/ML IJ SOLN: 2.0000 g | Freq: Once | INTRAMUSCULAR | Status: DC

## 2013-08-02 MED ORDER — TIMOLOL MALEATE 0.5 % OP SOLN
1.0000 [drp] | Freq: Once | OPHTHALMIC | Status: AC
  Administered 2013-08-02: 1 [drp] via OPHTHALMIC

## 2013-08-02 NOTE — Progress Notes (Signed)
eLink Physician-Brief Progress Note Patient Name: SHAQUOIA MIERS DOB: Dec 02, 1940 MRN: [id number]  Date of Service  08/02/2013   HPI/Events of Note     eICU Interventions  Hypokalemia -repleted    Intervention Category Minor Interventions: Electrolytes abnormality - evaluation and management  ALVA,RAKESH V. 08/02/2013, 5:18 PM

## 2013-08-02 NOTE — Progress Notes (Signed)
3 Days Post-Op  Subjective: More alert this AM.  Asking numerous questions which were answered. Belching some.  No flatus.  Objective: Vital signs in last 24 hours: Temp:  [97.7 F (36.5 C)-98.4 F (36.9 C)] 97.7 F (36.5 C) (11/23 0400) Pulse Rate:  [96-120] 107 (11/23 0622) Resp:  [16-34] 26 (11/23 0622) BP: (123-156)/(66-90) 137/73 mmHg (11/23 0622) SpO2:  [84 %-100 %] 98 % (11/23 0622) Weight:  [115 lb 8.3 oz (52.4 kg)] 115 lb 8.3 oz (52.4 kg) (11/23 0600) Last BM Date:  (pta)  Intake/Output from previous day: 11/22 0701 - 11/23 0700 In: 1940 [I.V.:1010; NG/GT:40; IV Piggyback:350; TPN:540] Out: 1640 [Urine:1245; Drains:395] Intake/Output this shift:    PE: General- In NAD Abdomen-soft, incisions clean and intact, g-tube site clean, thin serosanguinous drain output, hypoactive bowel sounds Lab Results:   Recent Labs  07/31/13 0630 08/01/13 0530  WBC 12.8* 12.0*  HGB 11.7* 9.1*  HCT 34.9* 27.9*  PLT 179 172   BMET  Recent Labs  07/31/13 0630 08/01/13 0530  NA 139 142  K 4.3 3.6  CL 112 114*  CO2 20 23  GLUCOSE 180* 138*  BUN 32* 33*  CREATININE 0.84 0.67  CALCIUM 7.1* 8.1*   PT/INR No results found for this basename: LABPROT, INR,  in the last 72 hours Comprehensive Metabolic Panel:    Component Value Date/Time   NA 142 08/01/2013 0530   K 3.6 08/01/2013 0530   CL 114* 08/01/2013 0530   CO2 23 08/01/2013 0530   BUN 33* 08/01/2013 0530   CREATININE 0.67 08/01/2013 0530   GLUCOSE 138* 08/01/2013 0530   CALCIUM 8.1* 08/01/2013 0530   AST 15 07/30/2013 0440   ALT 9 07/30/2013 0440   ALKPHOS 59 07/30/2013 0440   BILITOT 0.4 07/30/2013 0440   PROT 4.9* 07/30/2013 0440   ALBUMIN 2.4* 07/30/2013 0440     Studies/Results: Dg Chest Port 1 View  08/01/2013   CLINICAL DATA:  Hypertension, atelectasis.  EXAM: PORTABLE CHEST - 1 VIEW  COMPARISON:  July 31, 2013.  FINDINGS: Stable cardiomegaly. Endotracheal and nasogastric tubes have been removed.  Moderate bilateral pleural effusions are noted. Left pleural effusion is enlarged compared to prior exam. Underlying pneumonia or atelectasis cannot be excluded. Stable right-sided PICC line.  IMPRESSION: Bilateral pleural effusions are noted. Left pleural effusion is increased compared to prior exam and underlying pneumonia or atelectasis cannot be excluded.   Electronically Signed   By: Roque Lias M.D.   On: 08/01/2013 07:46   Dg Abd Portable 1v  08/01/2013   CLINICAL DATA:  Evaluate J-tube limb position, status post injection of 40 mil L Omnipaque 300, patient has a GJ tube, the tube was flushed with 40 mm H2O  EXAM: PORTABLE ABDOMEN - 1 VIEW  COMPARISON:  Abdominal CT dated 07/29/2013  FINDINGS: The patient's GJ tube is appreciated. Contrast identified within the tube. Dense contrast identified in what appears to be the stomach demonstrating gastric rugae. Contrast, dense is identified within the duodenum pole and proximal portions of the duodenum. Less dense contrast appreciated within the right lower quadrant likely secondary to recent CT and appears to be within colon. There is severe levoscoliosis of the thoracolumbar spine. Air is seen within nondilated loops of bowel. Skin staples project along the central abdomen and surgical clips project in the region of the level of the lower thoracic spine.  IMPRESSION: Contrast as described above which appears to be within stomach duodenum and duodenal bulb. Dilute contrast projects within  the right lower quadrant likely within the colon.   Electronically Signed   By: Salome Holmes M.D.   On: 08/01/2013 10:29    Anti-infectives: Anti-infectives   Start     Dose/Rate Route Frequency Ordered Stop   07/30/13 1700  ceFAZolin (ANCEF) IVPB 1 g/50 mL premix    Comments:  In OR holding   1 g 100 mL/hr over 30 Minutes Intravenous 3 times per day 07/30/13 0722     07/30/13 0900  ceFAZolin (ANCEF) IVPB 1 g/50 mL premix     1 g 100 mL/hr over 30 Minutes  Intravenous  Once 07/30/13 1610 07/30/13 0935      Assessment Exploratory laparotomy, lysis of adhesions (2 hours), reduction of hiatal hernia, repair of esophageal perforation, Dor fundoplication, evacuation of gastric bezoar, placement of the gastrostomy-jejunostomy tube, upper endoscopy on 07/2013-jejunal limb of feeding tube has back up into the stomach     LOS: 5 days   Plan: Continue NPO and g-tube to LIWS.  Gastrograffin UGI on 11/25.   Juliona Vales J 08/02/2013

## 2013-08-02 NOTE — Progress Notes (Signed)
PULMONARY  / CRITICAL CARE MEDICINE  Name: [name] MRN: [id number] DOB: May 08, 1941    ADMISSION DATE:  07/28/2013 CONSULTATION DATE:  11/19  REFERRING MD :  Christella Hartigan PRIMARY SERVICE: [facility] >> PCCM  CHIEF COMPLAINT: intractable vomiting. Intubated to protect airway  BRIEF PATIENT DESCRIPTION:  39 F with large HH adm 11/18 by [facility] with intractable vomiting and concern for UGIB. Underwent EGD 11/19 revealing possible gastric volvulus. Intubated after EGD to protect airway and permit completion of eval.   SIGNIFICANT EVENTS / STUDIES:  11/19 EGD: stomach was filled with solid brown, feculent appearing material. The gastric anatomy was very abnormal, distorted and twisted 11/19 CT chest/abd/pelvis:  11-20 Exp lap (Rosenbower): Large hiatal hernia, gastric outlet obstruction, gastric volvulus, bezoar, esophageal perforation. PROCEDURE: lysis of adhesions, reduction of hiatal hernia, repair of esophageal perforation, Dor fundoplication, evacuation of gastric bezoar, placement of the gastrostomy-jejunostomy tube 08/01/13: Extubate yesterday Deconditioned. In pain : needing fent q2h but able to sit in chair. Oriiented and plesan  LINES / TUBES: ETT 11/19 >> 11-21 11-19 RUE PICC >>  11-20 GJ tube >>    CULTURES: MRSA PCR 11/18 >> NEG  Recent Results (from the past 240 hour(s))  MRSA PCR SCREENING     Status: None   Collection Time    07/28/13  1:37 PM      Result Value Range Status   MRSA by PCR NEGATIVE  NEGATIVE Final   Comment:            The GeneXpert MRSA Assay (FDA     approved for NASAL specimens     only), is one component of a     comprehensive MRSA colonization     surveillance program. It is not     intended to diagnose MRSA     infection nor to guide or     monitor treatment for     MRSA infections.     ANTIBIOTICS: Cefazolin 11/20 (peri-op) >>     SUBJECTIVE:    08/02/13: Extuzated x 48h. No flatus. Belching some. Says she is disappointed she cannot ride her  lawn mower or lift 10# of weight  VITAL SIGNS: Temp:  [97.7 F (36.5 C)-98.4 F (36.9 C)] 97.7 F (36.5 C) (11/23 0400) Pulse Rate:  [96-118] 111 (11/23 0900) Resp:  [16-34] 33 (11/23 0900) BP: (123-164)/(66-96) 156/78 mmHg (11/23 0900) SpO2:  [84 %-99 %] 96 % (11/23 0900) Weight:  [52.4 kg (115 lb 8.3 oz)] 52.4 kg (115 lb 8.3 oz) (11/23 0600) HEMODYNAMICS:   VENTILATOR SETTINGS:   INTAKE / OUTPUT: Intake/Output     11/22 0701 - 11/23 0700 11/23 0701 - 11/24 0700   I.V. (mL/kg) 1210 (23.1)    NG/GT 40    IV Piggyback 400    TPN 656    Total Intake(mL/kg) 2306 (44)    Urine (mL/kg/hr) 1245 (1) 400 (2.4)   Drains 395 (0.3)    Total Output 1640 400   Net +666 -400          PHYSICAL EXAMINATION: General:  RASS 0, + F/C. Deconditioned but looking better Neuro: no focal deficits. No abd pain. RASS 0. CAM-ICU negative for delirum HEENT: WNL Cardiovascular: RRR s M  Lungs: clear anteriorly Abdomen: dressing +, tender + nondistended, minimal BS Ext: warm, no edema   PULMONARY No results found for this basename: PHART, PCO2, PCO2ART, PO2, PO2ART, HCO3, TCO2, O2SAT,  in the last 168 hours  CBC  Recent Labs Lab 07/30/13 2246 07/31/13  0630 08/01/13 0530  HGB 12.0 11.7* 9.1*  HCT 35.6* 34.9* 27.9*  WBC 9.3 12.8* 12.0*  PLT 163 179 172    COAGULATION  Recent Labs Lab 07/28/13 0900  INR 1.14    CARDIAC  No results found for this basename: TROPONINI,  in the last 168 hours No results found for this basename: PROBNP,  in the last 168 hours   CHEMISTRY  Recent Labs Lab 07/29/13 0320 07/30/13 0440 07/30/13 1352 07/30/13 2246 07/31/13 0630 08/01/13 0530  NA 143 144 145 140 139 142  K 3.5 3.4* 4.2 4.1 4.3 3.6  CL 106 109  --  112 112 114*  CO2 28 28  --  21 20 23   GLUCOSE 208* 145* 195* 171* 180* 138*  BUN 37* 31*  --  29* 32* 33*  CREATININE 0.91 0.67  --  0.71 0.84 0.67  CALCIUM 8.4 8.2*  --  7.1* 7.1* 8.1*  MG  --  2.0  --   --   --  1.8  PHOS  --   2.0*  --   --   --  2.3   Estimated Creatinine Clearance: 41.5 ml/min (by C-G formula based on Cr of 0.67).   LIVER  Recent Labs Lab 07/28/13 0900 07/30/13 0440  AST 32 15  ALT 18 9  ALKPHOS 99 59  BILITOT 0.9 0.4  PROT 7.9 4.9*  ALBUMIN 4.2 2.4*  INR 1.14  --      INFECTIOUS  Recent Labs Lab 07/28/13 0911 07/31/13 0014  LATICACIDVEN 2.20 1.7     ENDOCRINE CBG (last 3)   Recent Labs  08/01/13 2302 08/02/13 0341 08/02/13 0737  GLUCAP 125* 106* 104*         IMAGING x48h  Dg Chest Port 1 View  08/01/2013   CLINICAL DATA:  Hypertension, atelectasis.  EXAM: PORTABLE CHEST - 1 VIEW  COMPARISON:  July 31, 2013.  FINDINGS: Stable cardiomegaly. Endotracheal and nasogastric tubes have been removed. Moderate bilateral pleural effusions are noted. Left pleural effusion is enlarged compared to prior exam. Underlying pneumonia or atelectasis cannot be excluded. Stable right-sided PICC line.  IMPRESSION: Bilateral pleural effusions are noted. Left pleural effusion is increased compared to prior exam and underlying pneumonia or atelectasis cannot be excluded.   Electronically Signed   By: Roque Lias M.D.   On: 08/01/2013 07:46   Dg Abd Portable 1v  08/01/2013   CLINICAL DATA:  Evaluate J-tube limb position, status post injection of 40 mil L Omnipaque 300, patient has a GJ tube, the tube was flushed with 40 mm H2O  EXAM: PORTABLE ABDOMEN - 1 VIEW  COMPARISON:  Abdominal CT dated 07/29/2013  FINDINGS: The patient's GJ tube is appreciated. Contrast identified within the tube. Dense contrast identified in what appears to be the stomach demonstrating gastric rugae. Contrast, dense is identified within the duodenum pole and proximal portions of the duodenum. Less dense contrast appreciated within the right lower quadrant likely secondary to recent CT and appears to be within colon. There is severe levoscoliosis of the thoracolumbar spine. Air is seen within nondilated loops of  bowel. Skin staples project along the central abdomen and surgical clips project in the region of the level of the lower thoracic spine.  IMPRESSION: Contrast as described above which appears to be within stomach duodenum and duodenal bulb. Dilute contrast projects within the right lower quadrant likely within the colon.   Electronically Signed   By: Salome Holmes M.D.   On: 08/01/2013  10:29      ASSESSMENT / PLAN:  PULMONARY A: Acute respiratory failure  Compromised airway - resolved. Extubated 07/31/13  08/02/13: Maintaining airway and resp status  P:   IS  CARDIOVASCULAR A: No acute issues        P:  Monitor  RENAL A:  Making adeuate urine Mild Low K and Mild low Mag 08/01/13 - repleted        P:   Recheck labs  Replete K and mag: goak K > 4, goal Mag > 2 Monitor BMET intermittently Correct electrolytes as indicated DC foley  GASTROINTESTINAL A:   Large HH  Gastric outlet obstruction Protein-calorie malnutrition S/P laparotomy 11/20 P:   Cont TPN Post op mgmt per CCS  HEMATOLOGIC A:   Anemia P:  DVT px: SCDs Continue to monitor CBC  Transfuse for HBG <7   INFECTIOUS A:  No issues P:   CCS to manage peri-op abx Micro and abx as above  ENDOCRINE CBG (last 3)  A:   Mild Hyperglycemia due to TPN without hx of DM P:   Cont CBGs/SSI while on TPN  NEUROLOGIC A:   Acute encephalopathy after EGD, resolved  Post op pain P:   RN directed prn fentanyl (patient decline pca fentanyl)   GLOBAL 08/02/13: no family at bedside. Patient says she is upset she cannot lift weight or mow lawn mower when she is discharged. Wil change to sdu; dc foley   Dr. Kalman Shan, M.D., [facility].C.P Pulmonary and Critical Care Medicine Staff Physician [facility] [place] Pager: [phone], If no answer or between  15:00h - 7:00h: call [phone]  08/02/2013 10:12 AM

## 2013-08-02 NOTE — Progress Notes (Signed)
PARENTERAL NUTRITION CONSULT NOTE - follow up  Pharmacy Consult for TNA Indication: Hiatal hernia, unable to use enteral nutrition  Allergies  Allergen Reactions  . Codeine     REACTION: Dlizzy   Patient Measurements: Height: 4\' 7"  (139.7 cm) Weight: 115 lb 8.3 oz (52.4 kg) IBW/kg (Calculated) : 34 Usual Weight: 98lb (44.5kg)  Vital Signs:  Temp: 97.7 F (36.5 C) (11/23 0400) Temp src: Oral (11/23 0400) BP: 156/78 mmHg (11/23 0900) Pulse Rate: 111 (11/23 0900) Intake/Output from previous day: 11/22 0701 - 11/23 0700 In: 2306 [I.V.:1210; NG/GT:40; IV Piggyback:400; TPN:656] Out: 1640 [Urine:1245; Drains:395] Intake/Output from this shift: Total I/O In: -  Out: 400 [Urine:400]  Labs:  Recent Labs  07/30/13 2246 07/31/13 0630 08/01/13 0530  WBC 9.3 12.8* 12.0*  HGB 12.0 11.7* 9.1*  HCT 35.6* 34.9* 27.9*  PLT 163 179 172    Recent Labs  07/30/13 2246 07/31/13 0630 08/01/13 0530  NA 140 139 142  K 4.1 4.3 3.6  CL 112 112 114*  CO2 21 20 23   GLUCOSE 171* 180* 138*  BUN 29* 32* 33*  CREATININE 0.71 0.84 0.67  CALCIUM 7.1* 7.1* 8.1*  MG  --   --  1.8  PHOS  --   --  2.3   Estimated Creatinine Clearance: 41.5 ml/min (by C-G formula based on Cr of 0.67).    Recent Labs  08/01/13 2302 08/02/13 0341 08/02/13 0737  GLUCAP 125* 106* 104*   Insulin Requirements in the past 24 hours:  No hx DM, currently on sensitive Novolog scale q4hr CBG avg < 130 (improved), required 2 units Novolog past 24 hr   Current Nutrition:  TNA E 5/20 at 28ml/hr, Lipids 20% at 4 ml/hr Tube feed Held: J tube has migrated into stomach (per jejunostomy tube: Vital AF 1.2 was at 20 ml/hr planned slow advancement to goal rate 45 ml/hr)  Fluids:  NS @ at 50 ml/mr  Labs:  Renal: SCr wnl, UOP  ~0.6 ml/kg/hr Lytes: Corrected Ca 8.9, Ion Ca 1.26, Mag & Phos wnl Endo: CBGs < 130 Hepatic: LFTs WNL Albumin decreased to 2.4, prealbumin 9.6  Triglycerides: 87 on  11/20  Lines: PICC triple lumen (11/19) Peripheral IV L and R forearm ( 11/18) G tube to suction J tube for tube feeding  Assessment: 36 yoF with long history of hiatal hernia s/p nissen fundoplication presents with intractable vomiting and concern for UGIB. 11/18 EGD showed possible gastric volvulus with feculant material in esophagus.  11/19 CT abd/pelvis/chest findings did not suggest gastric volvulus, but did confirm large hiatal hernia with gastric outlet obstruction.  Pt was electively placed on mechanical ventilator on 11/18 following EGD to prevent aspiration and extubated11/22.  To OR 11/20 open repair of hiatal hernia.  TNA was ordered per pharmacy.  11/20:  To OR today for [facility] repair surgery. No issues with TNA per RN.  Phosphorus and potassium low, replaced  11/21: Lytes wnl, but Mag & Phos levels d/c by MD. UOP acceptable, CBG's acceptable. Total IV rate ~ 75 ml/hr 11/22: lytes wnl, tolerating tube feed, CBG's improved. 11/23: Tube feed held yesterday; j tube not in place. TNA only source of nutrition, no tube manipulation yesterday.   Nutritional Goals:  RD assessment 11/19: 1000 kCal, 60-70 grams of protein, 1L fluid per day. Vital AF 1.2 goal rate 45 m/hr Clinimix E5/20 @ goal rate of 35 ml/hr with lipids @ 66ml/hr will provide  ~1000 Kcal/day with 42 grams of protein  Plan:  At  anytime today can:   Increase Clinimix E 5/20 to 35 ml/hr and increase 20% fat emulsion to 6 ml/hr   Planned to advance tube feeding as tolerated to the goal rate and wean TNA when able to resume TF  TNA to contain standard multivitamins and trace elements.  Continue SSI q 4 h  TNA lab panels on Mondays & Thursdays.  Otho Bellows PharmD Pager [phone] 08/02/2013, 9:23 AM

## 2013-08-03 DIAGNOSIS — K861 Other chronic pancreatitis: Secondary | ICD-10-CM

## 2013-08-03 DIAGNOSIS — R197 Diarrhea, unspecified: Secondary | ICD-10-CM

## 2013-08-03 DIAGNOSIS — K219 Gastro-esophageal reflux disease without esophagitis: Secondary | ICD-10-CM

## 2013-08-03 LAB — GLUCOSE, CAPILLARY
Glucose-Capillary: 128 mg/dL — ABNORMAL HIGH (ref 70–99)
Glucose-Capillary: 103 mg/dL — ABNORMAL HIGH (ref 70–99)
Glucose-Capillary: 113 mg/dL — ABNORMAL HIGH (ref 70–99)
Glucose-Capillary: 109 mg/dL — ABNORMAL HIGH (ref 70–99)
Glucose-Capillary: 121 mg/dL — ABNORMAL HIGH (ref 70–99)

## 2013-08-03 LAB — CBC WITH DIFFERENTIAL/PLATELET
Basophils Absolute: 0 10*3/uL (ref 0.0–0.1)
Eosinophils Absolute: 0.2 10*3/uL (ref 0.0–0.7)
Eosinophils Relative: 2 % (ref 0–5)
Lymphocytes Relative: 6 % — ABNORMAL LOW (ref 12–46)
MCV: 87.1 fL (ref 78.0–100.0)
Neutro Abs: 7.7 10*3/uL (ref 1.7–7.7)
Platelets: 203 10*3/uL (ref 150–400)
RDW: 13.4 % (ref 11.5–15.5)
WBC: 9.5 10*3/uL (ref 4.0–10.5)

## 2013-08-03 LAB — COMPREHENSIVE METABOLIC PANEL
AST: 18 U/L (ref 0–37)
Albumin: 1.8 g/dL — ABNORMAL LOW (ref 3.5–5.2)
BUN: 15 mg/dL (ref 6–23)
CO2: 28 mEq/L (ref 19–32)
Calcium: 8.2 mg/dL — ABNORMAL LOW (ref 8.4–10.5)
Creatinine, Ser: 0.44 mg/dL — ABNORMAL LOW (ref 0.50–1.10)
Sodium: 141 mEq/L (ref 135–145)
Total Bilirubin: 0.3 mg/dL (ref 0.3–1.2)
Total Protein: 5 g/dL — ABNORMAL LOW (ref 6.0–8.3)

## 2013-08-03 LAB — BASIC METABOLIC PANEL
Calcium: 8 mg/dL — ABNORMAL LOW (ref 8.4–10.5)
Creatinine, Ser: 0.41 mg/dL — ABNORMAL LOW (ref 0.50–1.10)
GFR calc non Af Amer: >90 mL/min (ref >90)
Sodium: 140 mEq/L (ref 135–145)

## 2013-08-03 LAB — PREALBUMIN: Prealbumin: 6.2 mg/dL — ABNORMAL LOW (ref 17.0–34.0)

## 2013-08-03 LAB — MAGNESIUM: Magnesium: 1.7 mg/dL (ref 1.5–2.5)

## 2013-08-03 LAB — PRO B NATRIURETIC PEPTIDE: Pro B Natriuretic peptide (BNP): 252.7 pg/mL — ABNORMAL HIGH (ref 0–125)

## 2013-08-03 LAB — OCCULT BLOOD X 1 CARD TO LAB, STOOL: Fecal Occult Bld: POSITIVE — AB

## 2013-08-03 LAB — PHOSPHORUS: Phosphorus: 2.2 mg/dL — ABNORMAL LOW (ref 2.3–4.6)

## 2013-08-03 LAB — TRIGLYCERIDES: Triglycerides: 110 mg/dL (ref <150)

## 2013-08-03 MED ORDER — POTASSIUM CHLORIDE 10 MEQ/50ML IV SOLN
10.0000 meq | INTRAVENOUS | Status: AC
  Administered 2013-08-03 (×6): 10 meq via INTRAVENOUS
  Filled 2013-08-03 (×9): qty 50

## 2013-08-03 MED ORDER — POTASSIUM CHLORIDE IN NACL 20-0.9 MEQ/L-% IV SOLN
INTRAVENOUS | Status: DC
  Administered 2013-08-03: 1000 mL via INTRAVENOUS
  Filled 2013-08-03 (×3): qty 1000

## 2013-08-03 MED ORDER — FAT EMULSION 20 % IV EMUL
250.0000 mL | INTRAVENOUS | Status: AC
  Administered 2013-08-03: 18:00:00 250 mL via INTRAVENOUS
  Filled 2013-08-03: qty 250

## 2013-08-03 MED ORDER — MAGNESIUM SULFATE 40 MG/ML IJ SOLN
2.0000 g | Freq: Once | INTRAMUSCULAR | Status: AC
  Administered 2013-08-03: 2 g via INTRAVENOUS
  Filled 2013-08-03: qty 50

## 2013-08-03 MED ORDER — TRACE MINERALS CR-CU-F-FE-I-MN-MO-SE-ZN IV SOLN
INTRAVENOUS | Status: AC
  Administered 2013-08-03: 18:00:00 via INTRAVENOUS
  Filled 2013-08-03: qty 1000

## 2013-08-03 MED ORDER — POTASSIUM PHOSPHATE DIBASIC 3 MMOLE/ML IV SOLN
10.0000 mmol | Freq: Once | INTRAVENOUS | Status: AC
  Administered 2013-08-03: 16:00:00 10 mmol via INTRAVENOUS
  Filled 2013-08-03: qty 3.33

## 2013-08-03 MED ORDER — HEPARIN SODIUM (PORCINE) 5000 UNIT/ML IJ SOLN
5000.0000 [IU] | Freq: Three times a day (TID) | INTRAMUSCULAR | Status: DC
  Administered 2013-08-03 – 2013-08-15 (×36): 5000 [IU] via SUBCUTANEOUS
  Filled 2013-08-03 (×39): qty 1

## 2013-08-03 NOTE — Progress Notes (Signed)
Daughter contacted me to ask for visit to help patient process some of the changes to her life that her current medical situation may cause. Patient has lived independently and has done what she wants when she wants to. In my visit with the patient, she talked about mowing her yard and how she has done it herself because she is "picky". She has good church support from [employer] in [place] and thinks someone there may be able to help her. Daughter Eunice Blase) and son-in-law Nadine Counts) live in [place] and are coming from there each day to visit her. Patient has one other family member, a brother, nearby but she said he is older than she is and is not able to help her. Patient also told me that she can't eat now, but when she can, she wants vanilla ice cream. Will continue to support.

## 2013-08-03 NOTE — Progress Notes (Signed)
PULMONARY  / CRITICAL CARE MEDICINE  Name: [name] MRN: [id number] DOB: June 21, 1941    ADMISSION DATE:  07/28/2013 CONSULTATION DATE:  11/19  REFERRING MD :  Christella Hartigan PRIMARY SERVICE: TR[facility] >> PCCM  CHIEF COMPLAINT: intractable vomiting. Intubated to protect airway  BRIEF PATIENT DESCRIPTION:  52 F with large HH adm 11/18 by [facility] with intractable vomiting and concern for UGIB. Underwent EGD 11/19 revealing possible gastric volvulus. Intubated after EGD to protect airway and permit completion of eval.   SIGNIFICANT EVENTS / STUDIES:  11/19 - EGD: stomach was filled with solid brown, feculent appearing material. The gastric anatomy was very abnormal, distorted and twisted 11/19 - CT chest/abd/pelvis:  11/20 - Exp lap (Rosenbower): Large hiatal hernia, gastric outlet obstruction, gastric volvulus, bezoar, esophageal perforation. PROCEDURE: lysis of adhesions, reduction of hiatal hernia, repair of esophageal perforation, Dor fundoplication, evacuation of gastric bezoar, placement of the gastrostomy-jejunostomy tube 11/22 - Extubated yesterday Deconditioned. In pain : needing fent q2h but able to sit in chair. Oriented and pleasant  11/23 - Extuzated x 48h. No flatus. Belching some.   LINES / TUBES: ETT 11/19 >>11/21 RUE PICC 11/19>>>  GJ Tube11-20>>>   CULTURES: MRSA PCR 11/18 >> NEG  ANTIBIOTICS: Cefazolin 11/20 (peri-op)>>>   SUBJECTIVE:  Denies n/v, pain.  Reports small liquid stool.  RN reports dark in color.    VITAL SIGNS: Temp:  [98.1 F (36.7 C)-98.6 F (37 C)] 98.2 F (36.8 C) (11/24 0400) Pulse Rate:  [93-111] 111 (11/24 0600) Resp:  [25-33] 26 (11/24 0600) BP: (142-167)/(82-95) 151/92 mmHg (11/24 0600) SpO2:  [97 %-100 %] 97 % (11/24 0600)  INTAKE / OUTPUT: Intake/Output     11/23 0701 - 11/24 0700 11/24 0701 - 11/25 0700   I.V. (mL/kg) 950 (18.1)    Other 70    NG/GT     IV Piggyback 100    TPN 913    Total Intake(mL/kg) 2033 (38.8)    Urine (mL/kg/hr) 800  (0.6)    Drains 105 (0.1)    Stool 2 (0)    Total Output 907     Net +1126          Urine Occurrence 2 x    Stool Occurrence 4 x      PHYSICAL EXAMINATION: General:  Frail elderly female in NAD Neuro: no focal deficits. AAOx4, MAE HEENT: WNL Cardiovascular: RRR, s1s2 w no m/r/g  Lungs: resp's even/non-labored, lungs bilaterally with faint posterior crackles Abdomen: Dressing c/d/i, JP drain with serosanguineous fluid, GJ to sxn  Ext: warm, no edema MSK: scoliosis    PULMONARY No results found for this basename: PHART, PCO2, PCO2ART, PO2, PO2ART, HCO3, TCO2, O2SAT,  in the last 168 hours  CBC  Recent Labs Lab 08/01/13 0530 08/02/13 1537 08/03/13 0620  HGB 9.1* 8.6* 8.8*  HCT 27.9* 25.7* 26.3*  WBC 12.0* 10.4 9.5  PLT 172 196 203   COAGULATION  Recent Labs Lab 07/28/13 0900  INR 1.14   CARDIAC  No results found for this basename: TROPONINI,  in the last 168 hours  Recent Labs Lab 08/03/13 0620  PROBNP 252.7*   CHEMISTRY  Recent Labs Lab 07/29/13 0320 07/30/13 0440  07/30/13 2246 07/31/13 0630 08/01/13 0530 08/02/13 1537 08/03/13 0620  NA 143 144  < > 140 139 142 142 141  K 3.5 3.4*  < > 4.1 4.3 3.6 3.2* 3.2*  CL 106 109  --  112 112 114* 111 108  CO2 28 28  --  21 20 23 25 28   GLUCOSE 208* 145*  < > 171* 180* 138* 127* 82  BUN 37* 31*  --  29* 32* 33* 15 15  CREATININE 0.91 0.67  --  0.71 0.84 0.67 0.39* 0.44*  CALCIUM 8.4 8.2*  --  7.1* 7.1* 8.1* 7.5* 8.2*  MG  --  2.0  --   --   --  1.8  --  1.7  PHOS  --  2.0*  --   --   --  2.3  --  2.2*  < > = values in this interval not displayed. Estimated Creatinine Clearance: 41.5 ml/min (by C-G formula based on Cr of 0.44).  LIVER  Recent Labs Lab 07/28/13 0900 07/30/13 0440 08/03/13 0620  AST 32 15 18  ALT 18 9 11   ALKPHOS 99 59 87  BILITOT 0.9 0.4 0.3  PROT 7.9 4.9* 5.0*  ALBUMIN 4.2 2.4* 1.8*  INR 1.14  --   --    INFECTIOUS  Recent Labs Lab 07/28/13 0911 07/31/13 0014   LATICACIDVEN 2.20 1.7   ENDOCRINE CBG (last 3)   Recent Labs  08/03/13 0010 08/03/13 0339 08/03/13 0743  GLUCAP 120* 128* 103*   IMAGING x48h  No results found.    ASSESSMENT / PLAN:  PULMONARY A:  Acute respiratory failure - Compromised airway - resolved. 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 atx P:   -Pulmonary hygiene  -mobilize as able -oxygen to support sats >92% -may need to re CT left chest if lack of clinical progress  CARDIOVASCULAR A: Tachycardia - in setting of post-op pain.  Afebrile.   P:  -Monitor rhythm, tele  RENAL A:  Hypokalemia Hypomagnesemia P:   -Replete K and mag: goak K > 4, goal Mag > 2 -Monitor BMET  -Correct electrolytes as indicated, f/u K at 1800  GASTROINTESTINAL A:   Large Hiatal Hernia  Gastric outlet obstruction Protein-calorie malnutrition S/P laparotomy 11/20 P:   -Cont TPN per pharmacy -Post op mgmt per [facility] -GJ tube care -PPI  HEMATOLOGIC A:   Anemia P:  -DVT px: SCDs -Continue to monitor CBC  -Transfuse for HBG <7 -would like to add sub q heparin  INFECTIOUS A:  No fevers, did have esoph perf P:   -any clinical declines, add zosyn, diflucan, vanc -pcxr follow up for left chest -IS -if fever, ct chest  ENDOCRINE A:   Mild Hyperglycemia - due to TPN without hx of DM P:   -Cont CBGs/SSI while on TPN  NEUROLOGIC A:   Acute encephalopathy - after EGD, resolved  Post op pain P:   -PRN fentanyl    PCCM will transfer service back to [facility] as of am 11/25 0700.     Canary Brim, NP-C [facility] Pgr: [phone] or [phone]   08/03/2013 9:08 AM   I have fully examined this patient and agree with above findings.    And edited infull  Mcarthur Rossetti. Tyson Alias, MD, FACP Pgr: [phone] [facility]

## 2013-08-03 NOTE — Progress Notes (Signed)
NUTRITION FOLLOW UP  Intervention:   TPN; recommend increasing rate to 50 ml/hr to better meet re-estimated energy and protein needs (re-estimated needs due to extubation) Diet advancement per MD discretion Provide Carnation Instant Breakfast BID when diet advanced RD to continue to monitor nutrition care plan  Nutrition Dx:   Inadequate oral intake related to inability to eat/mechanical ventilation as evidenced by NPO; ongoing- pt extubated but remains NPO  Goal:   Pt to meet >/= 90% of their estimated nutrition needs; not yet met  Monitor:   TPN rate; at goal but meeting <90% of estimated energy and protein needs TF initiation/tolerance; discontinued Weight; up 24 lbs since admission Labs; blood glucose range 88-128 mg/dL, low potassium, low calcium, low albumin, low phosphorus  Assessment:   72 year old female dmitted from home with c/o nausea, vomiting, and coffee ground emesis that stated Saturday PTA. Hx of large hiatal hernia, GI bleeding, diverticulosis, GERD, IBS, HTN, and nissen fundoplication. On Creon with meals, vitamin D, and Benefiber at home. Found to have upper GI bleed. EGD  showed the stomach was filled with solid brown, feculent appearing material; gastric anatomy was very abnormal, distorted and twisted. Pt underwent exploratory laparotomy, lysis of adhesions (2 hours), reduction of hiatal hernia, repair of esophageal perforation, Dor fundoplication, evacuation of gastric bezoar, placement of the gastrostomy-jejunostomy tube 11/20.   Pt was extubated 11/22. Tube feeds were stopped 11/13- J-tube was out of place. TPN, Clinamix E 5/20, currently at goal rate of 35 ml/hr with lipids @ 6 ml/hr providing 1027 kcal and 42 grams protein. This meets 79% of re-estimated energy needs and 70% of re-estimated protein needs. Pt on bedside commode at time of visit and states she has had several small BM's today. She states that she needs a swallow evaluation and then she will be  advanced to a clear liquid diet. Pt reports drinking [employer] PTA; will add supplement when diet is advanced.    Height: Ht Readings from Last 1 Encounters:  08/02/13 4\' 7"  (1.397 m)    Weight Status:   Wt Readings from Last 1 Encounters:  08/02/13 115 lb 8.3 oz (52.4 kg)    Re-estimated needs:  Kcal: 1300-1500 Protein: 60-70 grams Fluid: 1.3-1.5 L/day  Skin: abdominal incision with closed system drain  Diet Order: NPO   Intake/Output Summary (Last 24 hours) at 08/03/13 1451 Last data filed at 08/03/13 1342  Gross per 24 hour  Intake   2257 ml  Output   1122 ml  Net   1135 ml    Last BM: 11/24   Labs:   Recent Labs Lab 07/30/13 0440  08/01/13 0530 08/02/13 1537 08/03/13 0620  NA 144  < > 142 142 141  K 3.4*  < > 3.6 3.2* 3.2*  CL 109  < > 114* 111 108  CO2 28  < > 23 25 28   BUN 31*  < > 33* 15 15  CREATININE 0.67  < > 0.67 0.39* 0.44*  CALCIUM 8.2*  < > 8.1* 7.5* 8.2*  MG 2.0  --  1.8  --  1.7  PHOS 2.0*  --  2.3  --  2.2*  GLUCOSE 145*  < > 138* 127* 82  < > = values in this interval not displayed.  CBG (last 3)   Recent Labs  08/03/13 0339 08/03/13 0743 08/03/13 1141  GLUCAP 128* 103* 113*    Scheduled Meds: . antiseptic oral rinse  15 mL Mouth Rinse QID  .  carboxymethylcellulose  1 drop Both Eyes TID WC & HS  . chlorhexidine  15 mL Mouth Rinse BID  . CVS EYE LUBRICANT NIGHTTIME  1 drop Ophthalmic QHS  . heparin subcutaneous  5,000 Units Subcutaneous Q8H  . insulin aspart  0-15 Units Subcutaneous Q4H  . pantoprazole (PROTONIX) IV  40 mg Intravenous Q24H  . potassium chloride  10 mEq Intravenous Q1 Hr x 6  . potassium phosphate IVPB (mmol)  10 mmol Intravenous Once  . sodium chloride  10-40 mL Intracatheter Q12H  . Tafluprost  1 drop Ophthalmic QHS  . timolol  1 drop Both Eyes BID    Continuous Infusions: . 0.9 % NaCl with KCl 20 mEq / L 1,000 mL (08/03/13 1218)  . Marland KitchenTPN (CLINIMIX-E) Adult 35 mL/hr at 08/02/13 1745    And  . fat emulsion 250 mL (08/02/13 1746)  . Marland KitchenTPN (CLINIMIX-E) Adult     And  . fat emulsion      Ian Malkin RD, LDN Inpatient Clinical2 Dietitian Pager: [phone] After Hours Pager: [phone]

## 2013-08-03 NOTE — Progress Notes (Signed)
4 Days Post-Op  Subjective: Alert. Very talkative. Seems appropriate. Denies nausea. Began having loose stools. These are dark and Hemoccult is pending.  She has a history of chronic diarrhea. She's been evaluated at [facility] and by Dr. Leone Payor. She says she takes Paramedic regularly at home.  GJ tube on drainage. JP drainage serosanguineous, odorless.  Labs pending  Objective: Vital signs in last 24 hours: Temp:  [98.1 F (36.7 C)-98.6 F (37 C)] 98.2 F (36.8 C) (11/24 0400) Pulse Rate:  [93-117] 101 (11/23 2200) Resp:  [25-33] 26 (11/23 2200) BP: (137-167)/(73-96) 156/86 mmHg (11/23 2200) SpO2:  [96 %-100 %] 99 % (11/23 2200) Last BM Date: 08/02/13  Intake/Output from previous day: 11/23 0701 - 11/24 0700 In: 921 [I.V.:350; IV Piggyback:100; TPN:421] Out: 907 [Urine:800; Drains:105; Stool:2] Intake/Output this shift: Total I/O In: 50 [Other:50] Out: 231 [Urine:200; Drains:30; Stool:1]     EXAM: General appearance: elderly. Frail.Thin. Very alert and talkative and appropriate for the most part. Resp: clear to auscultation anteriorly GI: Abdomen soft and nondistended. Hypoactive bowel sounds. Midline wound clean. Gastrostomy tube on drainage gastric fluid present. JP drain with thin, serosanguinous, odorless fluid.  Lab Results:  Results for orders placed during the hospital encounter of 07/28/13 (from the past 24 hour(s))  GLUCOSE, CAPILLARY     Status: Abnormal   Collection Time    08/02/13  7:37 AM      Result Value Range   Glucose-Capillary 104 (*) 70 - 99 mg/dL   Comment 1 Documented in Chart     Comment 2 Notify RN    GLUCOSE, CAPILLARY     Status: Abnormal   Collection Time    08/02/13 11:46 AM      Result Value Range   Glucose-Capillary 106 (*) 70 - 99 mg/dL  BASIC METABOLIC PANEL     Status: Abnormal   Collection Time    08/02/13  3:37 PM      Result Value Range   Sodium 142  135 - 145 mEq/L   Potassium 3.2 (*) 3.5 - 5.1 mEq/L   Chloride 111  96 - 112  mEq/L   CO2 25  19 - 32 mEq/L   Glucose, Bld 127 (*) 70 - 99 mg/dL   BUN 15  6 - 23 mg/dL   Creatinine, Ser 0.86 (*) 0.50 - 1.10 mg/dL   Calcium 7.5 (*) 8.4 - 10.5 mg/dL   GFR calc non Af Amer >90  >90 mL/min   GFR calc Af Amer >90  >90 mL/min  CBC     Status: Abnormal   Collection Time    08/02/13  3:37 PM      Result Value Range   WBC 10.4  4.0 - 10.5 K/uL   RBC 2.92 (*) 3.87 - 5.11 MIL/uL   Hemoglobin 8.6 (*) 12.0 - 15.0 g/dL   HCT 57.8 (*) 46.9 - 62.9 %   MCV 88.0  78.0 - 100.0 fL   MCH 29.5  26.0 - 34.0 pg   MCHC 33.5  30.0 - 36.0 g/dL   RDW 52.8  41.3 - 24.4 %   Platelets 196  150 - 400 K/uL  GLUCOSE, CAPILLARY     Status: Abnormal   Collection Time    08/02/13  4:47 PM      Result Value Range   Glucose-Capillary 106 (*) 70 - 99 mg/dL  GLUCOSE, CAPILLARY     Status: None   Collection Time    08/02/13  8:09 PM  Result Value Range   Glucose-Capillary 88  70 - 99 mg/dL  GLUCOSE, CAPILLARY     Status: Abnormal   Collection Time    08/03/13 12:10 AM      Result Value Range   Glucose-Capillary 120 (*) 70 - 99 mg/dL   Comment 1 Documented in Chart     Comment 2 Notify RN    GLUCOSE, CAPILLARY     Status: Abnormal   Collection Time    08/03/13  3:39 AM      Result Value Range   Glucose-Capillary 128 (*) 70 - 99 mg/dL   Comment 1 Notify RN       Studies/Results: @RISRSLT24 @  . antiseptic oral rinse  15 mL Mouth Rinse QID  . carboxymethylcellulose  1 drop Both Eyes TID WC & HS  .  ceFAZolin (ANCEF) IV  1 g Intravenous Q8H  . chlorhexidine  15 mL Mouth Rinse BID  . CVS EYE LUBRICANT NIGHTTIME  1 drop Ophthalmic QHS  . insulin aspart  0-15 Units Subcutaneous Q4H  . pantoprazole (PROTONIX) IV  40 mg Intravenous Q24H  . sodium chloride  10-40 mL Intracatheter Q12H  . Tafluprost  1 drop Ophthalmic QHS  . timolol  1 drop Both Eyes BID     Assessment/Plan: s/p Procedure(s): Exploratory Laparotomy, Lysis of Adhesions, Reduction of Hiatal Hernia, Repair of  Esophogeal Perforations, Dor Fundal Plication, Evacuation of Gastric Bezoar, Gastrostomy-Jejunostomy Tube Insertion, Upper Endoscopy by Dr. Abbey Chatters  POD #4. Stable. Continue n.p.o. On TNA. Gastrographin UGI 11/25 Check labs.  I.D. - on ancef.Not sure this is indicated, but we'll check was Dr. Abbey Chatters regarding operative findings before discontinuing.  Acute respiratory failure. Now extubated 07/31/2013 and stable.  Hypokalemia. Received 3 runs KCl yesterday  @PROBHOSP @  LOS: 6 days    [name] M. Derrell Lolling, M.D., [facility] General and Minimally invasive Surgery Breast and Colorectal Surgery Office:   [phone] Pager:   [phone]  08/03/2013  . .prob

## 2013-08-03 NOTE — Care Management Note (Addendum)
    Page 1 of 2   08/13/2013     1:15:49 PM   CARE MANAGEMENT NOTE 08/13/2013  Patient:  [name], [name]   Account Number:  [id number]  Date Initiated:  07/29/2013  Documentation initiated by:  DAVIS,RHONDA  Subjective/Objective Assessment:   pt with active emesis and intractable, intubation perform for airway protection     Action/Plan:   lives alone does have children in the area   Anticipated DC Date:  08/14/2013   Anticipated DC Plan:  SKILLED NURSING FACILITY  In-house referral  Clinical Social Worker      DC Planning Services  CM consult      Choice offered to / List presented to:             Status of service:  Completed, signed off Medicare Important Message given?  NA - LOS <3 / Initial given by admissions (If response is "NO", the following Medicare IM given date fields will be blank) Date Medicare IM given:   Date Additional Medicare IM given:    Discharge Disposition:  SKILLED NURSING FACILITY  Per UR Regulation:  Reviewed for med. necessity/level of care/duration of stay  If discussed at Long Length of Stay Meetings, dates discussed:   08/04/2013  08/11/2013  08/13/2013    Comments:  08/13/13 [name] RN,BSN NCM [id number] POD#14 ABD WND-BETTER.JP D/C.C DIFF PEND.TF@ 25CC/HR.MAY D/C IN AM SNF.  08/11/13 [name] RN,BSN NCM [id number] POD#12, ABD WND-OPENED-STARTED WET TO DRY DSG.IV ABX,RD-CAL COUNT.G-J TF@25CC /HR.D/C SNF WHEN MED STABLE.  08/10/13 [name] RN,BSN NCM [id number] POD#11 EXP PAL,LOA,REDUCTION OF HERNIA,G-J TUBE.IV ANCEF Q8H.AWAITING BLD CX RESULTS.D/C PLAN SNF.  08/09/2013 10:54 AM  NCM spoke to pt and states she wants to go to SNF-rehab for a short period of time to get stronger. She lives at home alone. States her son-in-law can probably go tomorrow and look at facilities once she receives offers. But she really likes [facility]. CSW referral made for SNF placement. [name] RN CCM Case Mgmt phone [phone]   08/07/13 [name] RN,BSN NCM [id number] POD#8,EXP LAP,LOA,REDUCTION OF HERNIA,G-J TUBE,JP-SEROUS 125CC,P-119,FULL LIQ.D/C PLAN SNF.  08/03/13 [name] RN,BSN NCM [id number] TRANSFER FROM SDU,EXTUBATED,POD#4 EXP LAP,LOA,REDUCTION OF HERNIA,G-J TUBE,JP-SEROSANG,KRUNS,IV PROTONIX,RESP FAILURE.D/C PLAN HOME.  [id number] [name], RN,BSN,CCM: Case management [phone] Chart reviewed and updated.  Next chart review due on [id number]. Needs for discharge at time of review:  None

## 2013-08-03 NOTE — Progress Notes (Signed)
Physical Therapy Treatment Patient Details Name: [name] MRN: [id number] DOB: 1940/11/09 Today's Date: 08/03/2013 Time: 0902-0930 PT Time Calculation (min): 28 min  PT Assessment / Plan / Recommendation  History of Present Illness 16 F with large HH adm 11/18 by [facility] with intractable vomiting and concern for UGIB. Underwent EGD 11/19 revealing possible gastric volvulus. Intubated after EGD to protect airway and permit completion of eval.11-20 Exp lap (Rosenbower): Large hiatal hernia, gastric outlet obstruction, gastric volvulus, bezoar, esophageal perforation. PROCEDURE: lysis of adhesions, reduction of hiatal hernia, repair of esophageal perforation, Dor fundoplication, evacuation of gastric bezoar, placement of the gastrostomy-jejunostomy tube   PT Comments   Pt tolerated increased standing, able to take a few steps. Limited by multiple tubes and lines.   Follow Up Recommendations  SNF     Does the patient have the potential to tolerate intense rehabilitation     Barriers to Discharge        Equipment Recommendations  None recommended by PT    Recommendations for Other Services    Frequency Min 3X/week   Progress towards PT Goals Progress towards PT goals: Progressing toward goals  Plan Current plan remains appropriate    Precautions / Restrictions Precautions Precautions: Fall;Other (comment) Precaution Comments: abd incision; JP drain; JG tube/suction to wall, diarrhea   Pertinent Vitals/Pain perirectum is painful due to diarrhea.Applied cream    Mobility  Bed Mobility Bed Mobility: Rolling Right;Right Sidelying to Sit Rolling Right: 3: Mod assist Right Sidelying to Sit: 2: Max assist Details for Bed Mobility Assistance: verbal and tactile cues for technique Transfers Transfers: Sit to Stand;Stand to Sit;Stand Pivot Transfers Sit to Stand: 3: Mod assist;From bed;From chair/3-in-1;With upper extremity assist Stand to Sit: To chair/3-in-1;With upper extremity  assist;3: Mod assist Stand Pivot Transfers: 1: +2 Total assist Stand Pivot Transfers: Patient Percentage: 60% Details for Transfer Assistance: +2 for line management, safety, pt  stood and took 3-4 steps to [facility] then to recliner holding onto armrest and PT arm. Cues for reaching to arm rest. Ambulation/Gait Ambulation/Gait Assistance: Not tested (comment)    Exercises     PT Diagnosis:    PT Problem List:   PT Treatment Interventions:     PT Goals (current goals can now be found in the care plan section)    Visit Information  Last PT Received On: 08/03/13 Assistance Needed: +2 History of Present Illness: 31 F with large HH adm 11/18 by TR[facility] with intractable vomiting and concern for UGIB. Underwent EGD 11/19 revealing possible gastric volvulus. Intubated after EGD to protect airway and permit completion of eval.11-20 Exp lap (Rosenbower): Large hiatal hernia, gastric outlet obstruction, gastric volvulus, bezoar, esophageal perforation. PROCEDURE: lysis of adhesions, reduction of hiatal hernia, repair of esophageal perforation, Dor fundoplication, evacuation of gastric bezoar, placement of the gastrostomy-jejunostomy tube    Subjective Data      Cognition  Cognition Arousal/Alertness: Awake/alert    Balance  Static Sitting Balance Static Sitting - Balance Support: Bilateral upper extremity supported;No upper extremity supported;Feet supported Static Sitting - Level of Assistance: 4: Min assist Static Sitting - Comment/# of Minutes: pt sat at edge of bed  and on BSC x 5 minutes total. Static Standing Balance Static Standing - Balance Support: Bilateral upper extremity supported Static Standing - Level of Assistance: 3: Mod assist Static Standing - Comment/# of Minutes: pt stood with support on PT arms to be clened up after toileting.   End of Session PT - End of Session Activity Tolerance: Patient  tolerated treatment well;Patient limited by pain Patient left: in chair;with call  bell/phone within reach Nurse Communication: Mobility status (diarrhea)   GP     Rada Hay 08/03/2013, 9:57 AM Blanchard Kelch PT [phone]

## 2013-08-03 NOTE — Progress Notes (Signed)
PARENTERAL NUTRITION CONSULT NOTE - follow up  Pharmacy Consult for TNA Indication: Hiatal hernia, unable to use enteral nutrition  Allergies  Allergen Reactions  . Codeine     REACTION: Dlizzy   Patient Measurements: Height: 4\' 7"  (139.7 cm) Weight: 115 lb 8.3 oz (52.4 kg) IBW/kg (Calculated) : 34 Usual Weight: 98lb (44.5kg)  Vital Signs:  Temp: 98.2 F (36.8 C) (11/24 0400) Temp src: Oral (11/24 0400) BP: 160/94 mmHg (11/24 0800) Pulse Rate: 113 (11/24 0800) Intake/Output from previous day: 11/23 0701 - 11/24 0700 In: 2033 [I.V.:950; IV Piggyback:100; TPN:913] Out: 907 [Urine:800; Drains:105; Stool:2] Intake/Output from this shift: Total I/O In: 182 [I.V.:100; TPN:82] Out: -   Labs:  Recent Labs  08/01/13 0530 08/02/13 1537 08/03/13 0620  WBC 12.0* 10.4 9.5  HGB 9.1* 8.6* 8.8*  HCT 27.9* 25.7* 26.3*  PLT 172 196 203    Recent Labs  08/01/13 0530 08/02/13 1537 08/03/13 0620 08/03/13 0630  NA 142 142 141  --   K 3.6 3.2* 3.2*  --   CL 114* 111 108  --   CO2 23 25 28   --   GLUCOSE 138* 127* 82  --   BUN 33* 15 15  --   CREATININE 0.67 0.39* 0.44*  --   CALCIUM 8.1* 7.5* 8.2*  --   MG 1.8  --  1.7  --   PHOS 2.3  --  2.2*  --   PROT  --   --  5.0*  --   ALBUMIN  --   --  1.8*  --   AST  --   --  18  --   ALT  --   --  11  --   ALKPHOS  --   --  87  --   BILITOT  --   --  0.3  --   TRIG  --   --   --  110   Estimated Creatinine Clearance: 41.5 ml/min (by C-G formula based on Cr of 0.44).    Recent Labs  08/03/13 0010 08/03/13 0339 08/03/13 0743  GLUCAP 120* 128* 103*   Insulin Requirements in the past 24 hours: 2 units  Nutritional Goals:  RD recs 1/19: 60-70 g of protein, 1000 kCal, 1L fluid per day. Recommend Vital AF 1.2 goal rate 45 m/hr. Clinimix E 5/20 at a goal rate of 57ml/hr + 20% fat emulsion at 28ml/hr to provide: 42g/day protein, 1027Kcal/day.  Current Nutrition:  Clinimix E 5/20 at 49ml/hr + 20% fat emulsion at  42ml/hr. NPO.  IVF: NS at 66ml/hr.  Assessment: 46 yoF with long history of hiatal hernia s/p nissen fundoplication presents with intractable vomiting and concern for UGIB. 11/18 EGD showed possible gastric volvulus with feculant material in esophagus.  11/19 CT abd/pelvis/chest findings did not suggest gastric volvulus, but did confirm large hiatal hernia with gastric outlet obstruction.  Pt was electively placed on mechanical ventilator on 11/18 following EGD to prevent aspiration and extubated 11/22. To OR 11/20 open repair of hiatal hernia, placement of J tube, TNA ordered per pharmacy. 11/23: Tube feed stopped. J tube out of place.    Glucose - controlled  Electrolytes - Hypo K, 6 runs ordered. Mag low-normal, bolus per MD. Phos low. Corr Ca 9.96.  LFTs - wnl  TGs - 110  Prealbumin - 9.6  TPN Access: PICC TPN started: 11/20  Plan:   Cont Clinimix E 5/20 at goal rate 58ml/hr.  Cont 20% fat emulsion at 67ml/hr.  TNA  to contain standard multivitamins and trace elements.  Bolus of KPhos ( KCl) today.  Change IVF to NS w/ 20KCl. Cont at 40ml/hr.  Cont Moderate SSI q4h.   Recheck bmet, mag, and phos in am  TNA lab panels on Mondays & Thursdays.  F/u daily.  Charolotte Eke, PharmD, pager [phone]. 08/03/2013,10:47 AM.

## 2013-08-04 ENCOUNTER — Encounter (HOSPITAL_COMMUNITY): Payer: Self-pay | Admitting: *Deleted

## 2013-08-04 ENCOUNTER — Inpatient Hospital Stay (HOSPITAL_COMMUNITY): Payer: Medicare Other

## 2013-08-04 DIAGNOSIS — E46 Unspecified protein-calorie malnutrition: Secondary | ICD-10-CM

## 2013-08-04 DIAGNOSIS — K562 Volvulus: Secondary | ICD-10-CM

## 2013-08-04 DIAGNOSIS — T182XXA Foreign body in stomach, initial encounter: Secondary | ICD-10-CM

## 2013-08-04 DIAGNOSIS — K449 Diaphragmatic hernia without obstruction or gangrene: Secondary | ICD-10-CM | POA: Diagnosis present

## 2013-08-04 DIAGNOSIS — K223 Perforation of esophagus: Secondary | ICD-10-CM | POA: Clinically undetermined

## 2013-08-04 DIAGNOSIS — E876 Hypokalemia: Secondary | ICD-10-CM | POA: Clinically undetermined

## 2013-08-04 HISTORY — DX: Volvulus: K56.2

## 2013-08-04 LAB — BASIC METABOLIC PANEL
BUN: 11 mg/dL (ref 6–23)
CO2: 29 mEq/L (ref 19–32)
CO2: 27 mEq/L (ref 19–32)
Calcium: 7.9 mg/dL — ABNORMAL LOW (ref 8.4–10.5)
Creatinine, Ser: 0.42 mg/dL — ABNORMAL LOW (ref 0.50–1.10)
GFR calc Af Amer: >90 mL/min (ref >90)
GFR calc non Af Amer: >90 mL/min (ref >90)
GFR calc non Af Amer: >90 mL/min (ref >90)
Glucose, Bld: 117 mg/dL — ABNORMAL HIGH (ref 70–99)
Potassium: 4.2 mEq/L (ref 3.5–5.1)
Sodium: 138 mEq/L (ref 135–145)

## 2013-08-04 LAB — GLUCOSE, CAPILLARY
Glucose-Capillary: 99 mg/dL (ref 70–99)
Glucose-Capillary: 81 mg/dL (ref 70–99)

## 2013-08-04 LAB — CBC WITH DIFFERENTIAL/PLATELET
Basophils Absolute: 0 10*3/uL (ref 0.0–0.1)
Basophils Relative: 0 % (ref 0–1)
Eosinophils Relative: 4 % (ref 0–5)
Lymphocytes Relative: 10 % — ABNORMAL LOW (ref 12–46)
MCHC: 33.2 g/dL (ref 30.0–36.0)
MCV: 86.8 fL (ref 78.0–100.0)
Monocytes Absolute: 0.8 10*3/uL (ref 0.1–1.0)
Neutrophils Relative %: 76 % (ref 43–77)
Platelets: 231 10*3/uL (ref 150–400)
RBC: 2.95 MIL/uL — ABNORMAL LOW (ref 3.87–5.11)
RDW: 13.1 % (ref 11.5–15.5)
WBC: 7.5 10*3/uL (ref 4.0–10.5)

## 2013-08-04 LAB — CLOSTRIDIUM DIFFICILE BY PCR: Toxigenic C. Difficile by PCR: NEGATIVE

## 2013-08-04 LAB — PHOSPHORUS: Phosphorus: 3.3 mg/dL (ref 2.3–4.6)

## 2013-08-04 MED ORDER — FERROUS SULFATE 325 (65 FE) MG PO TABS
325.0000 mg | ORAL_TABLET | Freq: Three times a day (TID) | ORAL | Status: DC
  Administered 2013-08-04 – 2013-08-15 (×30): 325 mg via ORAL
  Filled 2013-08-04 (×35): qty 1

## 2013-08-04 MED ORDER — TRACE MINERALS CR-CU-F-FE-I-MN-MO-SE-ZN IV SOLN
INTRAVENOUS | Status: AC
  Administered 2013-08-04: 18:00:00 via INTRAVENOUS
  Filled 2013-08-04: qty 2000

## 2013-08-04 MED ORDER — POTASSIUM CHLORIDE 10 MEQ/100ML IV SOLN
10.0000 meq | INTRAVENOUS | Status: AC
  Administered 2013-08-04 (×4): 10 meq via INTRAVENOUS
  Filled 2013-08-04 (×4): qty 100

## 2013-08-04 MED ORDER — ACETAMINOPHEN 325 MG PO TABS
325.0000 mg | ORAL_TABLET | Freq: Four times a day (QID) | ORAL | Status: DC | PRN
  Administered 2013-08-10: 650 mg via ORAL
  Filled 2013-08-04: qty 2

## 2013-08-04 MED ORDER — SACCHAROMYCES BOULARDII 250 MG PO CAPS
250.0000 mg | ORAL_CAPSULE | Freq: Two times a day (BID) | ORAL | Status: DC
  Administered 2013-08-04 – 2013-08-15 (×22): 250 mg via ORAL
  Filled 2013-08-04 (×23): qty 1

## 2013-08-04 MED ORDER — CALCIUM POLYCARBOPHIL 625 MG PO TABS
625.0000 mg | ORAL_TABLET | Freq: Two times a day (BID) | ORAL | Status: DC
  Administered 2013-08-04 – 2013-08-15 (×22): 625 mg via ORAL
  Filled 2013-08-04 (×24): qty 1

## 2013-08-04 MED ORDER — OXYCODONE HCL 5 MG PO TABS
5.0000 mg | ORAL_TABLET | ORAL | Status: DC | PRN
  Administered 2013-08-05 – 2013-08-09 (×4): 5 mg via ORAL
  Administered 2013-08-09: 10 mg via ORAL
  Administered 2013-08-10: 5 mg via ORAL
  Filled 2013-08-04 (×6): qty 1
  Filled 2013-08-04 (×2): qty 2

## 2013-08-04 MED ORDER — FAT EMULSION 20 % IV EMUL
250.0000 mL | INTRAVENOUS | Status: AC
  Administered 2013-08-04: 250 mL via INTRAVENOUS
  Filled 2013-08-04: qty 250

## 2013-08-04 MED ORDER — LOPERAMIDE HCL 2 MG PO CAPS
2.0000 mg | ORAL_CAPSULE | Freq: Three times a day (TID) | ORAL | Status: DC | PRN
  Administered 2013-08-05: 2 mg via ORAL
  Administered 2013-08-10: 17:00:00 4 mg via ORAL
  Administered 2013-08-11: 2 mg via ORAL
  Filled 2013-08-04: qty 2
  Filled 2013-08-04: qty 1

## 2013-08-04 MED ORDER — MAGNESIUM SULFATE 40 MG/ML IJ SOLN
2.0000 g | Freq: Once | INTRAMUSCULAR | Status: AC
  Administered 2013-08-04: 2 g via INTRAVENOUS
  Filled 2013-08-04: qty 50

## 2013-08-04 MED ORDER — PANCRELIPASE (LIP-PROT-AMYL) 12000-38000 UNITS PO CPEP
2.0000 | ORAL_CAPSULE | Freq: Three times a day (TID) | ORAL | Status: DC
Start: 2013-08-04 — End: 2013-08-15
  Administered 2013-08-04 – 2013-08-15 (×30): 2 via ORAL
  Filled 2013-08-04 (×36): qty 2

## 2013-08-04 MED ORDER — ONDANSETRON HCL 4 MG PO TABS
4.0000 mg | ORAL_TABLET | Freq: Four times a day (QID) | ORAL | Status: DC | PRN
  Filled 2013-08-04: qty 1

## 2013-08-04 MED ORDER — POTASSIUM CHLORIDE 10 MEQ/100ML IV SOLN
10.0000 meq | INTRAVENOUS | Status: AC
  Administered 2013-08-04 (×2): 10 meq via INTRAVENOUS
  Filled 2013-08-04 (×2): qty 100

## 2013-08-04 MED ORDER — IOHEXOL 300 MG/ML  SOLN
150.0000 mL | Freq: Once | INTRAMUSCULAR | Status: AC | PRN
  Administered 2013-08-04: 13:00:00 95 mL via ORAL

## 2013-08-04 MED ORDER — ALUM & MAG HYDROXIDE-SIMETH 200-200-20 MG/5ML PO SUSP: 30.0000 mL | Freq: Four times a day (QID) | ORAL | Status: DC | PRN

## 2013-08-04 MED ORDER — SODIUM CHLORIDE 0.9 % IV SOLN
INTRAVENOUS | Status: DC
  Administered 2013-08-04 – 2013-08-13 (×7): via INTRAVENOUS
  Filled 2013-08-04 (×16): qty 1000

## 2013-08-04 MED ORDER — LOPERAMIDE HCL 2 MG PO CAPS
2.0000 mg | ORAL_CAPSULE | Freq: Every day | ORAL | Status: DC
  Administered 2013-08-04 – 2013-08-13 (×10): 2 mg via ORAL
  Filled 2013-08-04 (×15): qty 1

## 2013-08-04 MED ORDER — BENEFIBER PO TABS
1.0000 | ORAL_TABLET | Freq: Two times a day (BID) | ORAL | Status: DC
Start: 2013-08-04 — End: 2013-08-04

## 2013-08-04 MED ORDER — MAGIC MOUTHWASH
15.0000 mL | Freq: Four times a day (QID) | ORAL | Status: DC | PRN
  Filled 2013-08-04: qty 15

## 2013-08-04 MED ORDER — PANTOPRAZOLE SODIUM 40 MG PO TBEC
40.0000 mg | DELAYED_RELEASE_TABLET | Freq: Every day | ORAL | Status: DC
  Administered 2013-08-04 – 2013-08-13 (×10): 40 mg via ORAL
  Filled 2013-08-04 (×12): qty 1

## 2013-08-04 MED ORDER — LIP MEDEX EX OINT
1.0000 "application " | TOPICAL_OINTMENT | Freq: Two times a day (BID) | CUTANEOUS | Status: DC
  Administered 2013-08-04 – 2013-08-15 (×22): 1 via TOPICAL
  Filled 2013-08-04: qty 7

## 2013-08-04 NOTE — Progress Notes (Signed)
PARENTERAL NUTRITION CONSULT NOTE - follow up  Pharmacy Consult for TNA Indication: Hiatal hernia, unable to use enteral nutrition  Allergies  Allergen Reactions  . Codeine     REACTION: Dlizzy   Patient Measurements: Height: 4\' 9"  (144.8 cm) Weight: 111 lb 15.9 oz (50.8 kg) IBW/kg (Calculated) : 38.6 Usual Weight: 98lb (44.5kg)  Vital Signs:  Temp: 98.3 F (36.8 C) (11/25 0520) Temp src: Oral (11/25 0520) BP: 146/87 mmHg (11/25 0520) Pulse Rate: 109 (11/25 0520) Intake/Output from previous day: 11/24 0701 - 11/25 0700 In: 1873 [I.V.:735; IV Piggyback:400; TPN:738] Out: 962 [Urine:550; Drains:410; Stool:2] Intake/Output from this shift: Total I/O In: 40 [Other:40] Out: 490 [Urine:400; Drains:90]  Labs:  Recent Labs  08/02/13 1537 08/03/13 0620 08/04/13 0545  WBC 10.4 9.5 7.5  HGB 8.6* 8.8* 8.5*  HCT 25.7* 26.3* 25.6*  PLT 196 203 231    Recent Labs  08/03/13 0620 08/03/13 0630 08/03/13 1800 08/04/13 0545  NA 141  --  140 139  K 3.2*  --  3.8 3.3*  CL 108  --  107 105  CO2 28  --  28 29  GLUCOSE 82  --  121* 124*  BUN 15  --  13 11  CREATININE 0.44*  --  0.41* 0.42*  CALCIUM 8.2*  --  8.0* 7.9*  MG 1.7  --   --  1.8  PHOS 2.2*  --   --  3.3  PROT 5.0*  --   --   --   ALBUMIN 1.8*  --   --   --   AST 18  --   --   --   ALT 11  --   --   --   ALKPHOS 87  --   --   --   BILITOT 0.3  --   --   --   PREALBUMIN 6.2*  --   --   --   TRIG  --  110  --   --    Estimated Creatinine Clearance: 43.7 ml/min (by C-G formula based on Cr of 0.42).    Recent Labs  08/04/13 0408 08/04/13 0501 08/04/13 0741  GLUCAP 148* 124* 133*   Insulin Requirements in the past 24 hours:  CBGs: 103- 148  Moderate SSI: 6 units  Nutritional Goals:   RD recs 11/24: 60-70 g of protein, 1300-1500 kCal, 1.3-1.5 L fluid per day.  Clinimix E 5/15 at a goal rate of 55 ml/hr + 20% fat emulsion at 10 ml/hr to provide: 66 g/day protein, 1417 Kcal/day.  Current  Nutrition:   Clinimix E 5/20 at 86ml/hr + 20% fat emulsion at 48ml/hr.  Diet: NPO  IVF: NS with KCl 40 mEq/L at 55ml/hr.  Assessment: 31 yoF with long history of hiatal hernia s/p nissen fundoplication presents with intractable vomiting and concern for UGIB. 11/18 EGD showed possible gastric volvulus with feculant material in esophagus.  11/19 CT abd/pelvis/chest findings did not suggest gastric volvulus, but did confirm large hiatal hernia with gastric outlet obstruction.  Pt was electively placed on mechanical ventilator on 11/18 following EGD to prevent aspiration and extubated 11/22. To OR 11/20 open repair of hiatal hernia, placement of J tube, TNA ordered per pharmacy. 11/24  Energy needs re-estimated (after extubation on 11/22) and J tube out of place on 11/23 causing TF to be stopped.  Change formula and goal rate today to meet nutritional goals.   Glucose - controlled  Electrolytes - K 3.3 (low despite KCl 60 mEq  IV and KCl 15 mEq from Kphos, MD ordered bolus today). Mag low-normal (despite 2g bolus yesterday, additional 2g today per MD).  Phos is wnl (after KPhos 10 mmol on 11/24). Corr Ca 9.66.  LFTs - wnl (11/24)  TGs - 110 (11/24)  Prealbumin - 9.6 (11/24)  TPN Access: PICC TPN started: 11/20  Plan:   Change to Clinimix E 5/15 at goal rate 55 ml/hr.  Change to 20% fat emulsion at 10 ml/hr daily  TNA to contain standard multivitamins and trace elements.  KCl 10 mEq IV x4 runs and Mag 2g IV per MD  Give additional KCl 10 mEq IV x2 runs (total of 6 runs).  Continue IVF of NS w/ 40 KCl at 69ml/hr per MD.  Cont Moderate SSI q4h.   Recheck BMET tonight at 1800.  Repeat BMET, mag, and phos in am  TNA lab panels on Mondays & Thursdays.   F/u daily.   Lynann Beaver PharmD, BCPS Pager [phone] 08/04/2013 12:44 PM

## 2013-08-04 NOTE — Progress Notes (Signed)
Follow up. Patient said she was feeling "pretty rough" at the time of the visit. She was holding her spirometer and said it was very painful to cough. Listening; presence; support.

## 2013-08-04 NOTE — Progress Notes (Signed)
5 Days Post-Op  Subjective: Pt doing well.  No N/V, abdominal pain well controlled.  Having lots of flatus and BM's c/o diarrhea. Mobilizing OOB.  Using IS and at 1000.  GJ tube to suction.  Pending UGI swallow study.  Pt wants to drink clear liquids.  Objective: Vital signs in last 24 hours: Temp:  [98.2 F (36.8 C)-98.5 F (36.9 C)] 98.3 F (36.8 C) (11/25 0520) Pulse Rate:  [95-110] 109 (11/25 0520) Resp:  [20-22] 20 (11/25 0520) BP: (145-153)/(78-87) 146/87 mmHg (11/25 0520) SpO2:  [98 %-100 %] 99 % (11/25 0520) Weight:  [111 lb 15.9 oz (50.8 kg)] 111 lb 15.9 oz (50.8 kg) (11/25 0520) Last BM Date: 08/03/13  Intake/Output from previous day: 11/24 0701 - 11/25 0700 In: 1873 [I.V.:735; IV Piggyback:400; TPN:738] Out: 962 [Urine:550; Drains:410; Stool:2] Intake/Output this shift: Total I/O In: 40 [Other:40] Out: 490 [Urine:400; Drains:90]  PE: Gen:  Alert, NAD, pleasant Abd: Soft, mild tenderness, ND, +BS, no HSM, incisions C/D/I, drain with 90mL serosanguinous output, GJ tube up to suction, but will be placed to gravity   Lab Results:   Recent Labs  08/03/13 0620 08/04/13 0545  WBC 9.5 7.5  HGB 8.8* 8.5*  HCT 26.3* 25.6*  PLT 203 231   BMET  Recent Labs  08/03/13 1800 08/04/13 0545  NA 140 139  K 3.8 3.3*  CL 107 105  CO2 28 29  GLUCOSE 121* 124*  BUN 13 11  CREATININE 0.41* 0.42*  CALCIUM 8.0* 7.9*   PT/INR No results found for this basename: LABPROT, INR,  in the last 72 hours CMP     Component Value Date/Time   NA 139 08/04/2013 0545   K 3.3* 08/04/2013 0545   CL 105 08/04/2013 0545   CO2 29 08/04/2013 0545   GLUCOSE 124* 08/04/2013 0545   BUN 11 08/04/2013 0545   CREATININE 0.42* 08/04/2013 0545   CALCIUM 7.9* 08/04/2013 0545   PROT 5.0* 08/03/2013 0620   ALBUMIN 1.8* 08/03/2013 0620   AST 18 08/03/2013 0620   ALT 11 08/03/2013 0620   ALKPHOS 87 08/03/2013 0620   BILITOT 0.3 08/03/2013 0620   GFRNONAA >90 08/04/2013 0545   GFRAA >90  08/04/2013 0545   Lipase     Component Value Date/Time   LIPASE 16 07/28/2013 0900       Studies/Results: Dg Chest Port 1 View  08/04/2013   CLINICAL DATA:  History of atelectasis and pleural effusions  EXAM: PORTABLE CHEST - 1 VIEW  COMPARISON:  August 01, 2013  FINDINGS: The lungs are better aerated today. The hemidiaphragms remain partially obscured consistent with layering of pleural effusions. The cardiac silhouette is not enlarged. The pulmonary vascularity does not appear significantly engorged. The trachea is midline. The observed portions of the bony structures appear normal.  IMPRESSION: There is improved aeration of both lungs. Bibasilar atelectasis persists and bilateral pleural effusions remain present.   Electronically Signed   By: David  [place]   On: 08/04/2013 07:55    Anti-infectives: Anti-infectives   Start     Dose/Rate Route Frequency Ordered Stop   07/30/13 1700  ceFAZolin (ANCEF) IVPB 1 g/50 mL premix  Status:  Discontinued    Comments:  In OR holding   1 g 100 mL/hr over 30 Minutes Intravenous 3 times per day 07/30/13 0722 08/03/13 1409   07/30/13 0900  ceFAZolin (ANCEF) IVPB 1 g/50 mL premix     1 g 100 mL/hr over 30 Minutes Intravenous  Once 07/30/13 1610  07/30/13 0935       Assessment/Plan Exploratory Laparotomy, Lysis of Adhesions, Reduction of Hiatal Hernia, Repair of Esophogeal Perforations, Dor Fundal Plication, Evacuation of Gastric Bezoar, Gastrostomy-Jejunostomy Tube Insertion, Upper Endoscopy by Dr. Abbey Chatters  POD #5. Stable.  Continue n.p.o. On TNA.  Gastrographin UGI Swallow today before advancing diet to check for esophageal leak Check labs.  Protein calorie malnutrition on TNA I.D. - on ancef discontinued after 5 day course.   Acute respiratory failure. Now extubated 07/31/2013 and stable.  Hypokalemia. Received 6 runs KCl yesterday, 4 planned for today Disp:  PT/OT recommending SNF    LOS: 7 days    [name], [name] 08/04/2013, 8:53  AM Pager: [phone]

## 2013-08-04 NOTE — Progress Notes (Signed)
[facility] CONSULT NOTE/PROGRESS NOTE  [name] ZOX:[id number] DOB: [birth year] DOA: 07/28/2013 PCP: Hollice Espy, MD  Assessment/Plan: #1recurrent recurrent large hiatal hernia/gastric outlet obstruction/esophageal perforation/gastric bezoar Status post laparotomy 07/30/2013. Patient passing gas. Patient with loose stools. Patient on TPN per pharmacy. Continue PPI.  If patient spikes fevers were due to her rates will need followup chest x-ray to evaluate esophageal per 100 declines may consider adding Zosyn Diflucan and vancomycin. If fever persists may need a CT of the chest. Per primary team.  #2 hypokalemia./Hypomagnesemia Likely secondary to GI losses. Replete. Keep potassium greater than 4. Keep magnesium greater than 2.  #3 acute respiratory failure Secondary to compromised at only. Resolved. Extubated 07/31/2013 Continue pulmonary hygiene. Mobilize. Keep sats greater than 92%. If worsens clinically may need to repeat chest x-ray. Follow.  #4 tachycardia Postop pain. Improved.  #5 acute encephalopathy Resolved.  #6 hypertension BP medications on hold. Resume Norvasc at 2.5 mg daily.  #7 chronic pancreatitis Patient has been started on clears. Resume preop.  #8 glaucoma Continue eyedrops.  #9 prophylaxis Protonix for GI prophylaxis. SCDs for DVT prophylaxis.     Code Status: Full Family Communication: Discussed with patient no family at bedside. Disposition Plan: Per primary team   Consultants:  PC CM.  Gastroenterology: Dr. Christella Hartigan  Procedures: ETT 11/19 >>11/21  RUE PICC 11/19>>>  GJ Tube11-20>>>  11/19 - EGD: stomach was filled with solid brown, feculent appearing material. The gastric anatomy was very abnormal, distorted and twisted  11/19 - CT chest/abd/pelvis:  11/20 - Exp lap (Rosenbower): Large hiatal hernia, gastric outlet obstruction, gastric volvulus, bezoar, esophageal perforation. PROCEDURE: lysis of adhesions, reduction of hiatal  hernia, repair of esophageal perforation, Dor fundoplication, evacuation of gastric bezoar, placement of the gastrostomy-jejunostomy tube  11/22 - Extubated yesterday Deconditioned. In pain : needing fent q2h but able to sit in chair. Oriented and pleasant  11/23 - Extuzated x 48h. No flatus. Belching some.    Antibiotics:  Cefazolin 11/20 perioperative >>>>  HPI/Subjective: Patient sitting on bedside commode in no distress. No complaints. Patient states had been having loose stools. Patient passing gas.  Objective: Filed Vitals:   08/04/13 1434  BP: 142/86  Pulse: 99  Temp: 98.3 F (36.8 C)  Resp: 20    Intake/Output Summary (Last 24 hours) at 08/04/13 1809 Last data filed at 08/04/13 1739  Gross per 24 hour  Intake 2394.87 ml  Output   2035 ml  Net 359.87 ml   Filed Weights   08/02/13 0600 08/03/13 2007 08/04/13 0520  Weight: 52.4 kg (115 lb 8.3 oz) 50.8 kg (111 lb 15.9 oz) 50.8 kg (111 lb 15.9 oz)    Exam:   General:  NAD  Cardiovascular: RRR  Respiratory: CTA B. No wheezes, no crackles, no rhonchi.  Abdomen: Soft, nontender, nondistended, positive bowel sounds. Incision site clean dry and intact.   Musculoskeletal: No clubbing cyanosis or edema.  Data Reviewed: Basic Metabolic Panel:  Recent Labs Lab 07/29/13 0320 07/30/13 0440  08/01/13 0530 08/02/13 1537 08/03/13 0620 08/03/13 1800 08/04/13 0545  NA 143 144  < > 142 142 141 140 139  K 3.5 3.4*  < > 3.6 3.2* 3.2* 3.8 3.3*  CL 106 109  < > 114* 111 108 107 105  CO2 28 28  < > 23 25 28 28 29   GLUCOSE 208* 145*  < > 138* 127* 82 121* 124*  BUN 37* 31*  < > 33* 15 15 13 11   CREATININE 0.91 0.67  < >  0.67 0.39* 0.44* 0.41* 0.42*  CALCIUM 8.4 8.2*  < > 8.1* 7.5* 8.2* 8.0* 7.9*  MG  --  2.0  --  1.8  --  1.7  --  1.8  PHOS  --  2.0*  --  2.3  --  2.2*  --  3.3  < > = values in this interval not displayed. Liver Function Tests:  Recent Labs Lab 07/30/13 0440 08/03/13 0620  AST 15 18  ALT 9 11   ALKPHOS 59 87  BILITOT 0.4 0.3  PROT 4.9* 5.0*  ALBUMIN 2.4* 1.8*   No results found for this basename: LIPASE, AMYLASE,  in the last 168 hours No results found for this basename: AMMONIA,  in the last 168 hours CBC:  Recent Labs Lab 07/30/13 0440  07/31/13 0630 08/01/13 0530 08/02/13 1537 08/03/13 0620 08/04/13 0545  WBC 5.9  < > 12.8* 12.0* 10.4 9.5 7.5  NEUTROABS 4.3  --   --   --   --  7.7 5.7  HGB 8.8*  < > 11.7* 9.1* 8.6* 8.8* 8.5*  HCT 26.5*  < > 34.9* 27.9* 25.7* 26.3* 25.6*  MCV 90.8  < > 88.6 88.3 88.0 87.1 86.8  PLT 154  < > 179 172 196 203 231  < > = values in this interval not displayed. Cardiac Enzymes: No results found for this basename: CKTOTAL, CKMB, CKMBINDEX, TROPONINI,  in the last 168 hours BNP (last 3 results)  Recent Labs  08/03/13 0620  PROBNP 252.7*   CBG:  Recent Labs Lab 08/04/13 0408 08/04/13 0501 08/04/13 0741 08/04/13 1318 08/04/13 1633  GLUCAP 148* 124* 133* 99 107*    Recent Results (from the past 240 hour(s))  MRSA PCR SCREENING     Status: None   Collection Time    07/28/13  1:37 PM      Result Value Range Status   MRSA by PCR NEGATIVE  NEGATIVE Final   Comment:            The GeneXpert MRSA Assay (FDA     approved for NASAL specimens     only), is one component of a     comprehensive MRSA colonization     surveillance program. It is not     intended to diagnose MRSA     infection nor to guide or     monitor treatment for     MRSA infections.  CLOSTRIDIUM DIFFICILE BY PCR     Status: None   Collection Time    08/03/13  4:18 PM      Result Value Range Status   C difficile by pcr NEGATIVE  NEGATIVE Final   Comment: Performed at [facility]     Studies: Dg Chest Port 1 View  08/04/2013   CLINICAL DATA:  History of atelectasis and pleural effusions  EXAM: PORTABLE CHEST - 1 VIEW  COMPARISON:  August 01, 2013  FINDINGS: The lungs are better aerated today. The hemidiaphragms remain partially obscured  consistent with layering of pleural effusions. The cardiac silhouette is not enlarged. The pulmonary vascularity does not appear significantly engorged. The trachea is midline. The observed portions of the bony structures appear normal.  IMPRESSION: There is improved aeration of both lungs. Bibasilar atelectasis persists and bilateral pleural effusions remain present.   Electronically Signed   By: David  [place]   On: 08/04/2013 07:55   Dg Kayleen Memos W/water Sol Cm  08/04/2013   CLINICAL DATA:  Large hiatal hernia with gastric volvulus  status post reduction, fundoplication, repair of esophageal perforation, and placement of gastrojejunostomy tube on 07/2013. Contrast swallow to rule out esophageal leak.  EXAM: WATER SOLUBLE UPPER GI SERIES  TECHNIQUE: Single-column upper GI series was performed using water soluble contrast.  CONTRAST:  95mL OMNIPAQUE IOHEXOL 300 MG/ML  SOLN  COMPARISON:  None.  FLUOROSCOPY TIME:  3 min 18 seconds  FINDINGS: Skin staples, surgical clips, surgical drain, and GJ tube overlie the upper abdomen. The patient drank contrast without difficulty. Contrast passed readily through the esophagus and into the stomach. There is no evidence of esophageal leak or obstruction. Limited evaluation of the stomach demonstrated a normal intra-abdominal location and configuration, with contrast seen to pass into proximal small bowel.  IMPRESSION: No evidence of esophageal leak.   Electronically Signed   By: Sebastian Ache   On: 08/04/2013 13:12    Scheduled Meds: . antiseptic oral rinse  15 mL Mouth Rinse QID  . carboxymethylcellulose  1 drop Both Eyes TID WC & HS  . [employer] EYE LUBRICANT NIGHTTIME  1 drop Ophthalmic QHS  . ferrous sulfate  325 mg Oral TID WC  . heparin subcutaneous  5,000 Units Subcutaneous Q8H  . insulin aspart  0-15 Units Subcutaneous Q4H  . lip balm  1 application Topical BID  . lipase/protease/amylase  2 capsule Oral TID WC  . loperamide  2 mg Oral QHS  . pantoprazole  40 mg Oral  Q1200  . polycarbophil  625 mg Oral BID  . saccharomyces boulardii  250 mg Oral BID  . sodium chloride  10-40 mL Intracatheter Q12H  . Tafluprost  1 drop Ophthalmic QHS  . timolol  1 drop Both Eyes BID   Continuous Infusions: . Marland KitchenTPN (CLINIMIX-E) Adult 55 mL/hr at 08/04/13 1733   And  . fat emulsion 250 mL (08/04/13 1735)  . sodium chloride 0.9 % 1,000 mL with potassium chloride 40 mEq infusion 50 mL/hr at 08/04/13 1914    Principal Problem:   Gastric outlet obstruction Active Problems:   Irritable bowel syndrome   GI bleed   Persistent recurrent vomiting   Nausea with vomiting   Acute respiratory failure with hypoxia   Protein calorie malnutrition   Esophageal perforation   Volvulus   Gastric bezoar   HH (hiatus hernia): RECURRENT   Hypokalemia   Hypomagnesemia    Time spent: 30 mins    Khylah Kendra M.D. [facility] Pager [phone]. If 7PM-7AM, please contact night-coverage at [email or web address], password [facility] 08/04/2013, 6:09 PM  LOS: 7 days

## 2013-08-04 NOTE — Progress Notes (Signed)
Pt stable Incision c/d/i UGI no leak  Start clears.  If tol OK, adv to pureed, do cal counts, wean off TNA over this week Switch to enteral meds Antidiarrheal regimen - imodium, iron, fiber, probiotic, panc enzymes Anemia - iron PT/OT   Dg Chest Port 1 View  08/04/2013   CLINICAL DATA:  History of atelectasis and pleural effusions  EXAM: PORTABLE CHEST - 1 VIEW  COMPARISON:  August 01, 2013  FINDINGS: The lungs are better aerated today. The hemidiaphragms remain partially obscured consistent with layering of pleural effusions. The cardiac silhouette is not enlarged. The pulmonary vascularity does not appear significantly engorged. The trachea is midline. The observed portions of the bony structures appear normal.  IMPRESSION: There is improved aeration of both lungs. Bibasilar atelectasis persists and bilateral pleural effusions remain present.   Electronically Signed   By: David  [place]   On: 08/04/2013 07:55   Dg Kayleen Memos W/water Sol Cm  08/04/2013   CLINICAL DATA:  Large hiatal hernia with gastric volvulus status post reduction, fundoplication, repair of esophageal perforation, and placement of gastrojejunostomy tube on 07/2013. Contrast swallow to rule out esophageal leak.  EXAM: WATER SOLUBLE UPPER GI SERIES  TECHNIQUE: Single-column upper GI series was performed using water soluble contrast.  CONTRAST:  95mL OMNIPAQUE IOHEXOL 300 MG/ML  SOLN  COMPARISON:  None.  FLUOROSCOPY TIME:  3 min 18 seconds  FINDINGS: Skin staples, surgical clips, surgical drain, and GJ tube overlie the upper abdomen. The patient drank contrast without difficulty. Contrast passed readily through the esophagus and into the stomach. There is no evidence of esophageal leak or obstruction. Limited evaluation of the stomach demonstrated a normal intra-abdominal location and configuration, with contrast seen to pass into proximal small bowel.  IMPRESSION: No evidence of esophageal leak.   Electronically Signed   By: Sebastian Ache    On: 08/04/2013 13:12    Results for orders placed during the hospital encounter of 07/28/13 (from the past 48 hour(s))  BASIC METABOLIC PANEL     Status: Abnormal   Collection Time    08/02/13  3:37 PM      Result Value Range   Sodium 142  135 - 145 mEq/L   Potassium 3.2 (*) 3.5 - 5.1 mEq/L   Chloride 111  96 - 112 mEq/L   CO2 25  19 - 32 mEq/L   Glucose, Bld 127 (*) 70 - 99 mg/dL   BUN 15  6 - 23 mg/dL   Comment: DELTA CHECK NOTED     REPEATED TO VERIFY   Creatinine, Ser 0.39 (*) 0.50 - 1.10 mg/dL   Comment: DELTA CHECK NOTED     REPEATED TO VERIFY   Calcium 7.5 (*) 8.4 - 10.5 mg/dL   GFR calc non Af Amer >90  >90 mL/min   GFR calc Af Amer >90  >90 mL/min   Comment: (NOTE)     The eGFR has been calculated using the CKD EPI equation.     This calculation has not been validated in all clinical situations.     eGFR's persistently <90 mL/min signify possible Chronic Kidney     Disease.  CBC     Status: Abnormal   Collection Time    08/02/13  3:37 PM      Result Value Range   WBC 10.4  4.0 - 10.5 K/uL   RBC 2.92 (*) 3.87 - 5.11 MIL/uL   Hemoglobin 8.6 (*) 12.0 - 15.0 g/dL   HCT 40.9 (*) 81.1 -  46.0 %   MCV 88.0  78.0 - 100.0 fL   MCH 29.5  26.0 - 34.0 pg   MCHC 33.5  30.0 - 36.0 g/dL   RDW 63.0  16.0 - 10.9 %   Platelets 196  150 - 400 K/uL  GLUCOSE, CAPILLARY     Status: Abnormal   Collection Time    08/02/13  4:47 PM      Result Value Range   Glucose-Capillary 106 (*) 70 - 99 mg/dL  GLUCOSE, CAPILLARY     Status: None   Collection Time    08/02/13  8:09 PM      Result Value Range   Glucose-Capillary 88  70 - 99 mg/dL  GLUCOSE, CAPILLARY     Status: Abnormal   Collection Time    08/03/13 12:10 AM      Result Value Range   Glucose-Capillary 120 (*) 70 - 99 mg/dL   Comment 1 Documented in Chart     Comment 2 Notify RN    GLUCOSE, CAPILLARY     Status: Abnormal   Collection Time    08/03/13  3:39 AM      Result Value Range   Glucose-Capillary 128 (*) 70 - 99  mg/dL   Comment 1 Notify RN    COMPREHENSIVE METABOLIC PANEL     Status: Abnormal   Collection Time    08/03/13  6:20 AM      Result Value Range   Sodium 141  135 - 145 mEq/L   Potassium 3.2 (*) 3.5 - 5.1 mEq/L   Chloride 108  96 - 112 mEq/L   CO2 28  19 - 32 mEq/L   Glucose, Bld 82  70 - 99 mg/dL   BUN 15  6 - 23 mg/dL   Creatinine, Ser 3.23 (*) 0.50 - 1.10 mg/dL   Calcium 8.2 (*) 8.4 - 10.5 mg/dL   Total Protein 5.0 (*) 6.0 - 8.3 g/dL   Albumin 1.8 (*) 3.5 - 5.2 g/dL   AST 18  0 - 37 U/L   ALT 11  0 - 35 U/L   Alkaline Phosphatase 87  39 - 117 U/L   Total Bilirubin 0.3  0.3 - 1.2 mg/dL   GFR calc non Af Amer >90  >90 mL/min   GFR calc Af Amer >90  >90 mL/min   Comment: (NOTE)     The eGFR has been calculated using the CKD EPI equation.     This calculation has not been validated in all clinical situations.     eGFR's persistently <90 mL/min signify possible Chronic Kidney     Disease.  MAGNESIUM     Status: None   Collection Time    08/03/13  6:20 AM      Result Value Range   Magnesium 1.7  1.5 - 2.5 mg/dL  PHOSPHORUS     Status: Abnormal   Collection Time    08/03/13  6:20 AM      Result Value Range   Phosphorus 2.2 (*) 2.3 - 4.6 mg/dL  PREALBUMIN     Status: Abnormal   Collection Time    08/03/13  6:20 AM      Result Value Range   Prealbumin 6.2 (*) 17.0 - 34.0 mg/dL   Comment: Performed at [facility]  CBC WITH DIFFERENTIAL     Status: Abnormal   Collection Time    08/03/13  6:20 AM      Result Value Range   WBC 9.5  4.0 - 10.5 K/uL   RBC 3.02 (*) 3.87 - 5.11 MIL/uL   Hemoglobin 8.8 (*) 12.0 - 15.0 g/dL   HCT 16.1 (*) 09.6 - 04.5 %   MCV 87.1  78.0 - 100.0 fL   MCH 29.1  26.0 - 34.0 pg   MCHC 33.5  30.0 - 36.0 g/dL   RDW 40.9  81.1 - 91.4 %   Platelets 203  150 - 400 K/uL   Neutrophils Relative % 81 (*) 43 - 77 %   Neutro Abs 7.7  1.7 - 7.7 K/uL   Lymphocytes Relative 6 (*) 12 - 46 %   Lymphs Abs 0.6 (*) 0.7 - 4.0 K/uL   Monocytes Relative 11   3 - 12 %   Monocytes Absolute 1.1 (*) 0.1 - 1.0 K/uL   Eosinophils Relative 2  0 - 5 %   Eosinophils Absolute 0.2  0.0 - 0.7 K/uL   Basophils Relative 0  0 - 1 %   Basophils Absolute 0.0  0.0 - 0.1 K/uL  PRO B NATRIURETIC PEPTIDE     Status: Abnormal   Collection Time    08/03/13  6:20 AM      Result Value Range   Pro B Natriuretic peptide (BNP) 252.7 (*) 0 - 125 pg/mL  TRIGLYCERIDES     Status: None   Collection Time    08/03/13  6:30 AM      Result Value Range   Triglycerides 110  <150 mg/dL   Comment: Performed at [facility]  OCCULT BLOOD X 1 CARD TO LAB, STOOL     Status: Abnormal   Collection Time    08/03/13  6:32 AM      Result Value Range   Fecal Occult Bld POSITIVE (*) NEGATIVE  GLUCOSE, CAPILLARY     Status: Abnormal   Collection Time    08/03/13  7:43 AM      Result Value Range   Glucose-Capillary 103 (*) 70 - 99 mg/dL  GLUCOSE, CAPILLARY     Status: Abnormal   Collection Time    08/03/13 11:41 AM      Result Value Range   Glucose-Capillary 113 (*) 70 - 99 mg/dL  CLOSTRIDIUM DIFFICILE BY PCR     Status: None   Collection Time    08/03/13  4:18 PM      Result Value Range   C difficile by pcr NEGATIVE  NEGATIVE   Comment: Performed at [facility]  GLUCOSE, CAPILLARY     Status: Abnormal   Collection Time    08/03/13  4:50 PM      Result Value Range   Glucose-Capillary 109 (*) 70 - 99 mg/dL  BASIC METABOLIC PANEL     Status: Abnormal   Collection Time    08/03/13  6:00 PM      Result Value Range   Sodium 140  135 - 145 mEq/L   Potassium 3.8  3.5 - 5.1 mEq/L   Chloride 107  96 - 112 mEq/L   CO2 28  19 - 32 mEq/L   Glucose, Bld 121 (*) 70 - 99 mg/dL   BUN 13  6 - 23 mg/dL   Creatinine, Ser 7.82 (*) 0.50 - 1.10 mg/dL   Calcium 8.0 (*) 8.4 - 10.5 mg/dL   GFR calc non Af Amer >90  >90 mL/min   GFR calc Af Amer >90  >90 mL/min   Comment: (NOTE)     The eGFR has been calculated using  the CKD EPI equation.     This calculation has not been  validated in all clinical situations.     eGFR's persistently <90 mL/min signify possible Chronic Kidney     Disease.  GLUCOSE, CAPILLARY     Status: Abnormal   Collection Time    08/03/13  8:52 PM      Result Value Range   Glucose-Capillary 121 (*) 70 - 99 mg/dL  GLUCOSE, CAPILLARY     Status: Abnormal   Collection Time    08/04/13 12:03 AM      Result Value Range   Glucose-Capillary 131 (*) 70 - 99 mg/dL  GLUCOSE, CAPILLARY     Status: Abnormal   Collection Time    08/04/13  4:08 AM      Result Value Range   Glucose-Capillary 148 (*) 70 - 99 mg/dL   Comment 1 Documented in Chart     Comment 2 Notify RN    GLUCOSE, CAPILLARY     Status: Abnormal   Collection Time    08/04/13  5:01 AM      Result Value Range   Glucose-Capillary 124 (*) 70 - 99 mg/dL  BASIC METABOLIC PANEL     Status: Abnormal   Collection Time    08/04/13  5:45 AM      Result Value Range   Sodium 139  135 - 145 mEq/L   Potassium 3.3 (*) 3.5 - 5.1 mEq/L   Chloride 105  96 - 112 mEq/L   CO2 29  19 - 32 mEq/L   Glucose, Bld 124 (*) 70 - 99 mg/dL   BUN 11  6 - 23 mg/dL   Creatinine, Ser 1.61 (*) 0.50 - 1.10 mg/dL   Calcium 7.9 (*) 8.4 - 10.5 mg/dL   GFR calc non Af Amer >90  >90 mL/min   GFR calc Af Amer >90  >90 mL/min   Comment: (NOTE)     The eGFR has been calculated using the CKD EPI equation.     This calculation has not been validated in all clinical situations.     eGFR's persistently <90 mL/min signify possible Chronic Kidney     Disease.  CBC WITH DIFFERENTIAL     Status: Abnormal   Collection Time    08/04/13  5:45 AM      Result Value Range   WBC 7.5  4.0 - 10.5 K/uL   RBC 2.95 (*) 3.87 - 5.11 MIL/uL   Hemoglobin 8.5 (*) 12.0 - 15.0 g/dL   HCT 09.6 (*) 04.5 - 40.9 %   MCV 86.8  78.0 - 100.0 fL   MCH 28.8  26.0 - 34.0 pg   MCHC 33.2  30.0 - 36.0 g/dL   RDW 81.1  91.4 - 78.2 %   Platelets 231  150 - 400 K/uL   Neutrophils Relative % 76  43 - 77 %   Neutro Abs 5.7  1.7 - 7.7 K/uL    Lymphocytes Relative 10 (*) 12 - 46 %   Lymphs Abs 0.7  0.7 - 4.0 K/uL   Monocytes Relative 11  3 - 12 %   Monocytes Absolute 0.8  0.1 - 1.0 K/uL   Eosinophils Relative 4  0 - 5 %   Eosinophils Absolute 0.3  0.0 - 0.7 K/uL   Basophils Relative 0  0 - 1 %   Basophils Absolute 0.0  0.0 - 0.1 K/uL  PHOSPHORUS     Status: None   Collection Time  08/04/13  5:45 AM      Result Value Range   Phosphorus 3.3  2.3 - 4.6 mg/dL  MAGNESIUM     Status: None   Collection Time    08/04/13  5:45 AM      Result Value Range   Magnesium 1.8  1.5 - 2.5 mg/dL  GLUCOSE, CAPILLARY     Status: Abnormal   Collection Time    08/04/13  7:41 AM      Result Value Range   Glucose-Capillary 133 (*) 70 - 99 mg/dL  GLUCOSE, CAPILLARY     Status: None   Collection Time    08/04/13  1:18 PM      Result Value Range   Glucose-Capillary 99  70 - 99 mg/dL

## 2013-08-05 LAB — CBC WITH DIFFERENTIAL/PLATELET
Basophils Absolute: 0 10*3/uL (ref 0.0–0.1)
Hemoglobin: 8.7 g/dL — ABNORMAL LOW (ref 12.0–15.0)
Lymphocytes Relative: 8 % — ABNORMAL LOW (ref 12–46)
Lymphs Abs: 0.7 10*3/uL (ref 0.7–4.0)
MCH: 29 pg (ref 26.0–34.0)
MCV: 87 fL (ref 78.0–100.0)
Neutro Abs: 6.9 10*3/uL (ref 1.7–7.7)
Platelets: 280 10*3/uL (ref 150–400)
RBC: 3 MIL/uL — ABNORMAL LOW (ref 3.87–5.11)
RDW: 13.2 % (ref 11.5–15.5)
WBC: 8.8 10*3/uL (ref 4.0–10.5)

## 2013-08-05 LAB — BASIC METABOLIC PANEL
CO2: 27 mEq/L (ref 19–32)
Calcium: 8.1 mg/dL — ABNORMAL LOW (ref 8.4–10.5)
Creatinine, Ser: 0.42 mg/dL — ABNORMAL LOW (ref 0.50–1.10)
GFR calc Af Amer: >90 mL/min (ref >90)
Glucose, Bld: 111 mg/dL — ABNORMAL HIGH (ref 70–99)
Potassium: 3.8 mEq/L (ref 3.5–5.1)

## 2013-08-05 LAB — GLUCOSE, CAPILLARY
Glucose-Capillary: 117 mg/dL — ABNORMAL HIGH (ref 70–99)
Glucose-Capillary: 113 mg/dL — ABNORMAL HIGH (ref 70–99)
Glucose-Capillary: 115 mg/dL — ABNORMAL HIGH (ref 70–99)

## 2013-08-05 LAB — MAGNESIUM: Magnesium: 2 mg/dL (ref 1.5–2.5)

## 2013-08-05 LAB — PHOSPHORUS: Phosphorus: 3.5 mg/dL (ref 2.3–4.6)

## 2013-08-05 MED ORDER — POTASSIUM CHLORIDE 10 MEQ/100ML IV SOLN
10.0000 meq | INTRAVENOUS | Status: AC
Start: 2013-08-05 — End: 2013-08-05
  Administered 2013-08-05 (×2): 10 meq via INTRAVENOUS
  Filled 2013-08-05 (×2): qty 100

## 2013-08-05 MED ORDER — TRACE MINERALS CR-CU-F-FE-I-MN-MO-SE-ZN IV SOLN
INTRAVENOUS | Status: AC
  Administered 2013-08-05: 18:00:00 via INTRAVENOUS
  Filled 2013-08-05: qty 2000

## 2013-08-05 MED ORDER — ZOLPIDEM TARTRATE 5 MG PO TABS
5.0000 mg | ORAL_TABLET | Freq: Once | ORAL | Status: AC
  Administered 2013-08-05: 5 mg via ORAL
  Filled 2013-08-05: qty 1

## 2013-08-05 MED ORDER — FAT EMULSION 20 % IV EMUL
250.0000 mL | INTRAVENOUS | Status: AC
  Administered 2013-08-05: 250 mL via INTRAVENOUS
  Filled 2013-08-05: qty 250

## 2013-08-05 MED ORDER — MAGNESIUM SULFATE IN D5W 10-5 MG/ML-% IV SOLN
1.0000 g | Freq: Once | INTRAVENOUS | Status: AC
  Administered 2013-08-05: 1 g via INTRAVENOUS
  Filled 2013-08-05: qty 100

## 2013-08-05 NOTE — Progress Notes (Signed)
Physical Therapy Treatment Patient Details Name: [name] MRN: [id number] DOB: [birth year] Today's Date: 08/05/2013 Time: 6295-2841 PT Time Calculation (min): 28 min  PT Assessment / Plan / Recommendation  History of Present Illness 61 F with large HH adm 11/18 by TRH with intractable vomiting and concern for UGIB. Underwent EGD 11/19 revealing possible gastric volvulus. Intubated after EGD to protect airway and permit completion of eval.11-20 Exp lap (Rosenbower): Large hiatal hernia, gastric outlet obstruction, gastric volvulus, bezoar, esophageal perforation. PROCEDURE: lysis of adhesions, reduction of hiatal hernia, repair of esophageal perforation, Dor fundoplication, evacuation of gastric bezoar, placement of the gastrostomy-jejunostomy tube   PT Comments   Pt  Is lethargic but arouses and participates in mobility. Pt is making slow progress but always willing to participate.  Follow Up Recommendations  SNF     Does the patient have the potential to tolerate intense rehabilitation     Barriers to Discharge        Equipment Recommendations  None recommended by PT    Recommendations for Other Services    Frequency Min 3X/week   Progress towards PT Goals Progress towards PT goals: Progressing toward goals  Plan Current plan remains appropriate    Precautions / Restrictions Precautions Precautions: Fall;Other (comment) Precaution Comments: abd incision; JP drain; JG tube/suction to wall, diarrhea   Pertinent Vitals/Pain HR 109    Mobility  Bed Mobility Bed Mobility: Supine to Sit;Rolling Right Rolling Right: 4: Min assist;With rail Right Sidelying to Sit: 2: Max assist;With rails;HOB elevated Details for Bed Mobility Assistance: verbal and tactile cues for technique, lifting assist for getting trunk into upright. Transfers Transfers: Sit to Stand;Stand to [employer] Transfers Sit to Stand: 3: Mod assist;From bed;With upper extremity assist Stand to Sit: To  chair/3-in-1;With upper extremity assist;3: Mod assist Stand Pivot Transfers: 3: Mod assist Details for Transfer Assistance: Pt stands and takes several steps to get to recliner. Extra time for abdominal pain. Ambulation/Gait Ambulation/Gait Assistance: Not tested (comment) Ambulation/Gait Assistance Details: Pt is very lethargic. wakes up long enough to mobilize.    Exercises     PT Diagnosis:    PT Problem List:   PT Treatment Interventions:     PT Goals (current goals can now be found in the care plan section)    Visit Information  Last PT Received On: 08/05/13 Assistance Needed: +2 (to try to walk) History of Present Illness: 2 F with large HH adm 11/18 by TR[facility] with intractable vomiting and concern for UGIB. Underwent EGD 11/19 revealing possible gastric volvulus. Intubated after EGD to protect airway and permit completion of eval.11-20 Exp lap (Rosenbower): Large hiatal hernia, gastric outlet obstruction, gastric volvulus, bezoar, esophageal perforation. PROCEDURE: lysis of adhesions, reduction of hiatal hernia, repair of esophageal perforation, Dor fundoplication, evacuation of gastric bezoar, placement of the gastrostomy-jejunostomy tube    Subjective Data      Cognition  Cognition Arousal/Alertness: Lethargic Behavior During Therapy: WFL for tasks assessed/performed    Balance  Static Sitting Balance Static Sitting - Balance Support: Feet supported;Bilateral upper extremity supported Static Sitting - Level of Assistance: 4: Min assist Static Sitting - Comment/# of Minutes: initially leans back  upon sitting up due to pain.  End of Session PT - End of Session Activity Tolerance: Patient limited by pain;Patient limited by lethargy Patient left: in chair;with call bell/phone within reach;with chair alarm set Nurse Communication: Mobility status   GP     Rada Hay 08/05/2013, 3:32 PM Blanchard Kelch PT [phone]

## 2013-08-05 NOTE — Progress Notes (Signed)
6 Days Post-Op  Subjective: Looks good, feeling better.  Very sore and tender to any touch of the abdomen.  Some drainage around the gastrostomy tube.  She has not had any further BM since a single imodium last PM.  Objective: Vital signs in last 24 hours: Temp:  [98.1 F (36.7 C)-98.3 F (36.8 C)] 98.1 F (36.7 C) (11/26 0344) Pulse Rate:  [98-115] 115 (11/26 0344) Resp:  [20-26] 26 (11/26 0344) BP: (142-143)/(85-86) 143/85 mmHg (11/26 0344) SpO2:  [97 %-100 %] 97 % (11/26 0344) Weight:  [50.3 kg (110 lb 14.3 oz)] 50.3 kg (110 lb 14.3 oz) (11/26 0500) Last BM Date: 08/04/13 100 PO yesterday,  11 stools recorded. Drain: 490 ml recorded I/O= 2024 ml negative balance Afebrile, VSS UGI yesterday shows no leak stomach in the normal intra abdominal position and contrast going into the SB. . .TPN (CLINIMIX-E) Adult 55 mL/hr at 08/04/13 1733   And  . fat emulsion 250 mL (08/04/13 1735)  . sodium chloride 0.9 % 1,000 mL with potassium chloride 40 mEq infusion 50 mL/hr at 08/05/13 0345  Clear liquid diet ordered Speech to see for swallow eval Intake/Output from previous day: 11/25 0701 - 11/26 0700 In: 2967.1 [P.O.:100; I.V.:1065; IV Piggyback:500; TPN:1262.1] Out: 4992 [Urine:4500; Drains:490; Stool:2] Intake/Output this shift:    General appearance: alert, cooperative and no distress Resp: clear to auscultation bilaterally GI: soft, tender just touching gastrostomy tube, serous drainage from the drain.  Few BS.  Sites look good.  Lab Results:   Recent Labs  08/04/13 0545 08/05/13 0455  WBC 7.5 8.8  HGB 8.5* 8.7*  HCT 25.6* 26.1*  PLT 231 280    BMET  Recent Labs  08/04/13 1823 08/05/13 0455  NA 138 139  K 4.2 3.8  CL 104 105  CO2 27 27  GLUCOSE 117* 111*  BUN 11 12  CREATININE 0.42* 0.42*  CALCIUM 8.2* 8.1*   PT/INR No results found for this basename: LABPROT, INR,  in the last 72 hours   Recent Labs Lab 07/30/13 0440 08/03/13 0620  AST 15 18  ALT 9  11  ALKPHOS 59 87  BILITOT 0.4 0.3  PROT 4.9* 5.0*  ALBUMIN 2.4* 1.8*     Lipase     Component Value Date/Time   LIPASE 16 07/28/2013 0900     Studies/Results: Dg Chest Port 1 View  08/04/2013   CLINICAL DATA:  History of atelectasis and pleural effusions  EXAM: PORTABLE CHEST - 1 VIEW  COMPARISON:  August 01, 2013  FINDINGS: The lungs are better aerated today. The hemidiaphragms remain partially obscured consistent with layering of pleural effusions. The cardiac silhouette is not enlarged. The pulmonary vascularity does not appear significantly engorged. The trachea is midline. The observed portions of the bony structures appear normal.  IMPRESSION: There is improved aeration of both lungs. Bibasilar atelectasis persists and bilateral pleural effusions remain present.   Electronically Signed   By: David  [place]   On: 08/04/2013 07:55   Dg Kayleen Memos W/water Sol Cm  08/04/2013   CLINICAL DATA:  Large hiatal hernia with gastric volvulus status post reduction, fundoplication, repair of esophageal perforation, and placement of gastrojejunostomy tube on 07/2013. Contrast swallow to rule out esophageal leak.  EXAM: WATER SOLUBLE UPPER GI SERIES  TECHNIQUE: Single-column upper GI series was performed using water soluble contrast.  CONTRAST:  95mL OMNIPAQUE IOHEXOL 300 MG/ML  SOLN  COMPARISON:  None.  FLUOROSCOPY TIME:  3 min 18 seconds  FINDINGS: Skin staples, surgical  clips, surgical drain, and GJ tube overlie the upper abdomen. The patient drank contrast without difficulty. Contrast passed readily through the esophagus and into the stomach. There is no evidence of esophageal leak or obstruction. Limited evaluation of the stomach demonstrated a normal intra-abdominal location and configuration, with contrast seen to pass into proximal small bowel.  IMPRESSION: No evidence of esophageal leak.   Electronically Signed   By: Sebastian Ache   On: 08/04/2013 13:12    Medications: . antiseptic oral rinse  15 mL  Mouth Rinse QID  . carboxymethylcellulose  1 drop Both Eyes TID WC & HS  . CVS EYE LUBRICANT NIGHTTIME  1 drop Ophthalmic QHS  . ferrous sulfate  325 mg Oral TID WC  . heparin subcutaneous  5,000 Units Subcutaneous Q8H  . insulin aspart  0-15 Units Subcutaneous Q4H  . lip balm  1 application Topical BID  . lipase/protease/amylase  2 capsule Oral TID WC  . loperamide  2 mg Oral QHS  . magnesium sulfate 1 - 4 g bolus IVPB  1 g Intravenous Once  . pantoprazole  40 mg Oral Q1200  . polycarbophil  625 mg Oral BID  . potassium chloride  10 mEq Intravenous Q1 Hr x 2  . saccharomyces boulardii  250 mg Oral BID  . sodium chloride  10-40 mL Intracatheter Q12H  . Tafluprost  1 drop Ophthalmic QHS  . timolol  1 drop Both Eyes BID    Assessment/Plan Recurrent large hiatal hernia with gastric outlet obstruction, gastric volvulus, bezoar, esophageal perforation  Exploratory Laparotomy, Lysis of Adhesions, Reduction of Hiatal Hernia, Repair of Esophogeal Perforations, Dor Fundal Plication, Evacuation of Gastric Bezoar, Gastrostomy-Jejunostomy Tube Insertion, Upper Endoscopy by Dr. 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   She has PT and will add OT.  Continue TNA till we get her up to a full diet.  LOS: 8 days    Dinna Severs 08/05/2013

## 2013-08-05 NOTE — Progress Notes (Signed)
Follow up. Pastoral care minister from patient's church arrived so I didn't actually go into the room at this time. Will return later today. Good support from church.

## 2013-08-05 NOTE — Progress Notes (Signed)
PARENTERAL NUTRITION CONSULT NOTE - follow up  Pharmacy Consult for TNA Indication: Hiatal hernia, unable to use enteral nutrition  Allergies  Allergen Reactions  . Codeine     REACTION: Dlizzy   Patient Measurements: Height: 4\' 9"  (144.8 cm) Weight: 110 lb 14.3 oz (50.3 kg) IBW/kg (Calculated) : 38.6 Usual Weight: 98lb (44.5kg)  Vital Signs:  Temp: 98.1 F (36.7 C) (11/26 0344) Temp src: Oral (11/26 0344) BP: 143/85 mmHg (11/26 0344) Pulse Rate: 115 (11/26 0344) Intake/Output from previous day: 11/25 0701 - 11/26 0700 In: 2967.1 [P.O.:100; I.V.:1065; IV Piggyback:500; TPN:1262.1] Out: 4992 [Urine:4500; Drains:490; Stool:2] Intake/Output from this shift: Total I/O In: 1287.7 [P.O.:40; I.V.:542.5; TPN:705.2] Out: 3042 [Urine:2900; Drains:140; Stool:2]  Labs:  Recent Labs  08/03/13 0620 08/04/13 0545 08/05/13 0455  WBC 9.5 7.5 8.8  HGB 8.8* 8.5* 8.7*  HCT 26.3* 25.6* 26.1*  PLT 203 231 280    Recent Labs  08/03/13 0620 08/03/13 0630  08/04/13 0545 08/04/13 1823 08/05/13 0455  NA 141  --   < > 139 138 139  K 3.2*  --   < > 3.3* 4.2 3.8  CL 108  --   < > 105 104 105  CO2 28  --   < > 29 27 27   GLUCOSE 82  --   < > 124* 117* 111*  BUN 15  --   < > 11 11 12   CREATININE 0.44*  --   < > 0.42* 0.42* 0.42*  CALCIUM 8.2*  --   < > 7.9* 8.2* 8.1*  MG 1.7  --   --  1.8  --  2.0  PHOS 2.2*  --   --  3.3  --  3.5  PROT 5.0*  --   --   --   --   --   ALBUMIN 1.8*  --   --   --   --   --   AST 18  --   --   --   --   --   ALT 11  --   --   --   --   --   ALKPHOS 87  --   --   --   --   --   BILITOT 0.3  --   --   --   --   --   PREALBUMIN 6.2*  --   --   --   --   --   TRIG  --  110  --   --   --   --   < > = values in this interval not displayed. Estimated Creatinine Clearance: 43.5 ml/min (by C-G formula based on Cr of 0.42).    Recent Labs  08/04/13 1929 08/04/13 2344 08/05/13 0344  GLUCAP 81 99 128*   Insulin Requirements in the past 24  hours:  CBGs: 103- 148  Moderate SSI: 4units  Nutritional Goals:   RD recs 11/24: 60-70 g of protein, 1300-1500 kCal, 1.3-1.5 L fluid per day.  Clinimix E 5/15 at a goal rate of 55 ml/hr + 20% fat emulsion at 8 ml/hr to provide: 66 g/day protein, 1321 Kcal/day.  Current Nutrition:   Clinimix E 5/15 at 76ml/hr + 20% fat emulsion at 69ml/hr.  Diet: CLQ 11/25  IVF: NS with KCl 40 mEq/L at 62ml/hr.  Assessment: 72 yoF with long history of hiatal hernia s/p nissen fundoplication presents with intractable vomiting and concern for UGIB. 11/18 EGD showed possible gastric volvulus with feculant material in  esophagus.  11/19 CT abd/pelvis/chest findings did not suggest gastric volvulus, but did confirm large hiatal hernia with gastric outlet obstruction.  Pt was electively placed on mechanical ventilator on 11/18 following EGD to prevent aspiration and extubated 11/22. To OR 11/20 open repair of hiatal hernia, placement of J tube, TNA ordered per pharmacy.  11/26: No esophageal leak per UGI 11/25, clear liquids started 11/26.  Energy needs re-estimated (after extubation on 11/22) and J tube out of place on 11/23 causing TF to be stopped.    Glucose - controlled  Electrolytes - K 3.8 (goal = 4 per MD), Mag = 2 (goal > 2 per MD).  Phos is wnl (after KPhos 10 mmol on 11/24). Corr Ca 9.86.  Renal: SCr stable, net I/O (24h) = -2L  LFTs - wnl (11/24)  TGs - 110 (11/24)  Prealbumin - 9.6 (11/24)  TPN Access: PICC TPN started: 11/20  Plan:   Patient started on clear liquids 11/25, continue Clinimix E 5/15 at goal rate 55 ml/hr for now until diet further advanced and tolerating (wean as appropriate)  Change to 20% fat emulsion at 8 ml/hr daily  TNA to contain standard multivitamins and trace elements.  Per MD goals, will give magnesium sulfate 1gm IVPB x 1 today (to keep Mg >2) and KCl 10 mEq IV x2 runs (to keep K+ >= 4)  Continue IVF of NS w/ 40 KCl at 5ml/hr per MD.  Cont  Moderate SSI q4h.   TNA lab panels on Mondays & Thursdays.   F/u daily.   Juliette Alcide, PharmD, BCPS.   Pager: [phone] 08/05/2013 6:54 AM

## 2013-08-05 NOTE — Progress Notes (Signed)
Pt c/o insomnia md paged awaiting call back.

## 2013-08-05 NOTE — Evaluation (Signed)
Clinical/Bedside Swallow Evaluation Patient Details  Name: [name] MRN: [id number] Date of Birth: [birth year]  Today's Date: 08/05/2013 Time: 1100-1112 SLP Time Calculation (min): 12 min  Past Medical History:  Past Medical History  Diagnosis Date  . Glaucoma   . Dry eye syndrome   . GI bleed   . Diverticulosis   . GERD (gastroesophageal reflux disease)   . Thoracogenic scoliosis of thoracolumbar region     SEVERE  . Chronic pancreatitis   . IBS (irritable bowel syndrome)   . Pelvic floor dysfunction     and rectalprolaspe  . Osteoporosis   . Internal hemorrhoids   . Hypertension   . Dry mouth   . Blood transfusion     HEMORRHAGED  1 WEEK AFTER COLONOSCOPY  -REQUIRED TRANSFUSION  . Abscessed tooth     STATES TOOTH PULLED RIGHT LOWER MOLAR   AND LEFT LOWER MOLAR ON3/25/13 AND PT ON ANTIBIOTIC  . Hiatal hernia     LARGE HIATAL HERNIA PER CXR REPORT  . 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 Nissen fundoplication    . Vaginal hysterectomy  2002    A&P repair/bladder sling  . Cataract extraction      bilateral  . Band hemorrhoidectomy    . Mouth surgery    . Colonoscopy  02/03/2008    diverticulosis, internal hemorrhoids  . Upper gastrointestinal endoscopy  02/12/2008    hiatal henia  . Flexible sigmoidoscopy  11/11/2008    internal hemorrhoids  . Hiatal hernia repair    . Mouth surgery    . Eye surgery      LASER OF BOTH EYES  FOR ELEVATED PRESSURE  . Bladder suspension  2002    Grapey - w/ hysty  . Staple hemorrhoidectomy    . Esophagogastroduodenoscopy N/A 07/28/2013    Procedure: ESOPHAGOGASTRODUODENOSCOPY (EGD);  Surgeon: Rachael Fee, MD;  Location: [facility];  Service: Endoscopy;  Laterality: N/A;  . Esophagogastroduodenoscopy N/A 07/28/2013    Procedure: ESOPHAGOGASTRODUODENOSCOPY  (EGD);  Surgeon: Rachael Fee, MD;  Location: [facility];  Service: Endoscopy;  Laterality: N/A;  . Hiatal hernia repair N/A 07/30/2013    Procedure: Exploratory Laparotomy, Lysis of Adhesions, Reduction of Hiatal Hernia, Repair of Esophogeal Perforations, Dor Fundal Plication, Evacuation of Gastric Bezoar, Gastrostomy Tube Insertion, Upper Endoscopy by Dr. [name];  Surgeon: Adolph Pollack, MD;  Location: [facility];  Service: General;  Laterality: N/A;   HPI:  72 y.o. female admitted with recurrent large hiatal hernia/gastric outlet obstruction/esophageal perforation/gastric bezoar .  Status post reduction, fundoplication, repair of esophageal perforation, and placement of gastrojejunostomy tube on 07/30/2013.   Has been started on clears and continues with TPN per pharmacy. Additional dx include  hypokalemia/hypomagnesemia, acute respiratory failure (intubated 11/19-21), acute encephalopathy, chronic pancreatitis.   Assessment / Plan / Recommendation Clinical Impression  Pt presents with generally functional oropharyngeal swallow.  The predominent symptom of questionable esophageal transfer is the high frequency of f/u swallows that accompany each PO bolus.  After initial consumption, pt repetitively swallows up to seven more times before she reports that the food has "gone down. " However, she reports no discomfort associated with eating and voiced satisfaction with trial solids.  There were no s/s of aspiration during exam.   Recommend advancing solid diet to regular, thin liquids when medically ready.  Pt should alternate solids with liquids and allow ample time; may benefit from smaller, more frequent meals. No SLP f/u is warranted - will sign off.             Diet Recommendation Regular;Thin liquid   Liquid Administration via: Straw Medication Administration: Whole meds with liquid Supervision: Patient able to self feed;Intermittent supervision to cue for compensatory  strategies Compensations: Slow rate;Small sips/bites;Follow solids with liquid Postural Changes and/or Swallow Maneuvers: Seated upright 90 degrees;Upright 30-60 min after meal    Other  Recommendations Oral Care Recommendations: Oral care BID   Follow Up Recommendations  None    Swallow Study Prior Functional Status       General Date of Onset: 07/28/13 Type of Study: Bedside swallow evaluation Previous Swallow Assessment: none per records Diet Prior to this Study: Thin liquids;TNA Temperature Spikes Noted: No Respiratory Status: Nasal cannula History of Recent Intubation: Yes Length of Intubations (days): 2 days Date extubated: 07/31/13 Behavior/Cognition: Alert Oral Cavity - Dentition: Adequate natural dentition Self-Feeding Abilities: Able to feed self Patient Positioning: Upright in bed Baseline Vocal Quality: Clear Volitional Cough: Strong Volitional Swallow: Able to elicit    Oral/Motor/Sensory Function Overall Oral Motor/Sensory Function: Appears within functional limits for tasks assessed   Ice Chips Ice chips: Not tested   Thin Liquid Thin Liquid: Impaired Presentation: Straw Pharyngeal  Phase Impairments: Multiple swallows    Nectar Thick Nectar Thick Liquid: Not tested   Honey Thick Honey Thick Liquid: Not tested   Puree Puree: Impaired Presentation: Self Fed;Spoon Pharyngeal Phase Impairments: Multiple swallows   Solid   Scotlyn Mccranie L. [place], [place] CCC/SLP Pager [phone]     Solid: Impaired Pharyngeal Phase Impairments: Multiple swallows       Blenda Mounts Laurice 08/05/2013,11:28 AM

## 2013-08-05 NOTE — Progress Notes (Signed)
General surgery attending note:  I have interviewed and examined this patient this morning. I agree with the assessment and treatment plan outlined by Mr. Marlyne Beards, [place].  Await swallowing evaluation prior to advancing diet. Her abdomen is still somewhat distended and I think we will have to go slow with diet. Continue IV support. PT and OT consults requested.   Angelia Mould. Derrell Lolling, M.D., [facility] General and Minimally invasive Surgery Breast and Colorectal Surgery Office:   [phone] Pager:   [phone]

## 2013-08-05 NOTE — Progress Notes (Signed)
Patient ID: [name], female   DOB: [birth year], 72 y.o.   MRN: [id number] [facility] PROGRESS NOTE  [name] VQQ:[id number] DOB: 1941/01/31 DOA: 07/28/2013 PCP: Hollice Espy, MD  Brief narrative: 53 F with large HH admitted 11/18 by [facility] with intractable vomiting and concern for UGIB. Underwent EGD 11/19 revealing possible gastric volvulus. Intubated after EGD to protect airway and permit completion of evaluation.    Assessment/Plan:  #1recurrent recurrent large hiatal hernia/gastric outlet obstruction/esophageal perforation/gastric bezoar  - Status post laparotomy 07/30/2013. Patient passing gas, with loose stools.  - Patient on TPN per pharmacy. Continue PPI. - management per primary team  #2 hypokalemia./Hypomagnesemia  - Likely secondary to GI losses. - supplemented and within normal limits this AM #3 acute respiratory failure  - Secondary to compromised airway. Resolved. Extubated 07/31/2013  - Continue pulmonary hygiene. Mobilize. Keep sats greater than 92%. If worsens clinically may need to repeat chest x-ray  #4 tachycardia  - Postop pain. Improved.  #5 acute encephalopathy  - Resolved.  #6 hypertension  - BP medications held initially - SBP in 140's  - will start to resume home BP medications  #7 chronic pancreatitis  - Patient has been started on clears. Resume preop.  #8 glaucoma  - Continue eyedrops.  #9 prophylaxis  - Protonix for GI prophylaxis. SCDs for DVT prophylaxis.  #10 Anemia of chronic disease - Hg and Hct overall stable   Code Status: Full  Family Communication: Discussed with patient no family at bedside.  Disposition Plan: Per primary team   Consultants:   PC CM.   Gastroenterology: Dr. Christella Hartigan Procedures:   ETT 11/19 >>11/21   RUE PICC 11/19>>>   GJ Tube11-20>>>   11/19 - EGD: stomach was filled with solid brown, feculent appearing material. The gastric anatomy was very abnormal, distorted and twisted   11/19 - CT  chest/abd/pelvis:   11/20 - Exp lap (Rosenbower): Large hiatal hernia, gastric outlet obstruction, gastric volvulus, bezoar, esophageal perforation. PROCEDURE: lysis of adhesions, reduction of hiatal hernia, repair of esophageal perforation, Dor fundoplication, evacuation of gastric bezoar, placement of the gastrostomy-jejunostomy tube   11/22 - Extubated 11/21 Deconditioned. In pain : needing fent q2h but able to sit in chair.  Antibiotics:   Cefazolin 11/20 perioperative   HPI/Subjective: No events overnight.   Objective: Filed Vitals:   08/04/13 1434 08/04/13 2231 08/05/13 0344 08/05/13 0500  BP: 142/86 142/85 143/85   Pulse: 99 98 115   Temp: 98.3 F (36.8 C) 98.2 F (36.8 C) 98.1 F (36.7 C)   TempSrc: Oral Oral Oral   Resp: 20 26 26    Height:      Weight:    50.3 kg (110 lb 14.3 oz)  SpO2: 99% 100% 97%     Intake/Output Summary (Last 24 hours) at 08/05/13 1351 Last data filed at 08/05/13 1100  Gross per 24 hour  Intake 2647.08 ml  Output   4642 ml  Net -1994.92 ml    Exam:   General:  Pt is alert, follows commands appropriately, not in acute distress  Cardiovascular: Regular rate and rhythm, S1/S2, no murmurs, no rubs, no gallops  Respiratory: Clear to auscultation bilaterally, no wheezing, no crackles, no rhonchi  Abdomen: slightly distended, bowel sounds present, no guarding  Extremities: No edema, pulses DP and PT palpable bilaterally  Data Reviewed: Basic Metabolic Panel:  Recent Labs Lab 07/30/13 0440  08/01/13 0530  08/03/13 0620 08/03/13 1800 08/04/13 0545 08/04/13 1823 08/05/13 0455  NA 144  < >  142  < > 141 140 139 138 139  K 3.4*  < > 3.6  < > 3.2* 3.8 3.3* 4.2 3.8  CL 109  < > 114*  < > 108 107 105 104 105  CO2 28  < > 23  < > 28 28 29 27 27   GLUCOSE 145*  < > 138*  < > 82 121* 124* 117* 111*  BUN 31*  < > 33*  < > 15 13 11 11 12   CREATININE 0.67  < > 0.67  < > 0.44* 0.41* 0.42* 0.42* 0.42*  CALCIUM 8.2*  < > 8.1*  < > 8.2* 8.0*  7.9* 8.2* 8.1*  MG 2.0  --  1.8  --  1.7  --  1.8  --  2.0  PHOS 2.0*  --  2.3  --  2.2*  --  3.3  --  3.5  < > = values in this interval not displayed. Liver Function Tests:  Recent Labs Lab 07/30/13 0440 08/03/13 0620  AST 15 18  ALT 9 11  ALKPHOS 59 87  BILITOT 0.4 0.3  PROT 4.9* 5.0*  ALBUMIN 2.4* 1.8*   CBC:  Recent Labs Lab 07/30/13 0440  08/01/13 0530 08/02/13 1537 08/03/13 0620 08/04/13 0545 08/05/13 0455  WBC 5.9  < > 12.0* 10.4 9.5 7.5 8.8  NEUTROABS 4.3  --   --   --  7.7 5.7 6.9  HGB 8.8*  < > 9.1* 8.6* 8.8* 8.5* 8.7*  HCT 26.5*  < > 27.9* 25.7* 26.3* 25.6* 26.1*  MCV 90.8  < > 88.3 88.0 87.1 86.8 87.0  PLT 154  < > 172 196 203 231 280  < > = values in this interval not displayed.  CBG:  Recent Labs Lab 08/04/13 1929 08/04/13 2344 08/05/13 0344 08/05/13 0743 08/05/13 1151  GLUCAP 81 99 128* 117* 113*    Recent Results (from the past 240 hour(s))  MRSA PCR SCREENING     Status: None   Collection Time    07/28/13  1:37 PM      Result Value Range Status   MRSA by PCR NEGATIVE  NEGATIVE Final   Comment:            The GeneXpert MRSA Assay (FDA     approved for NASAL specimens     only), is one component of a     comprehensive MRSA colonization     surveillance program. It is not     intended to diagnose MRSA     infection nor to guide or     monitor treatment for     MRSA infections.  CLOSTRIDIUM DIFFICILE BY PCR     Status: None   Collection Time    08/03/13  4:18 PM      Result Value Range Status   C difficile by pcr NEGATIVE  NEGATIVE Final   Comment: Performed at [facility]    Scheduled Meds: . carboxymethylcellulose  1 drop Both Eyes TID WC & HS  . ferrous sulfate  325 mg Oral TID WC  . heparin subcutaneous  5,000 Units Subcutaneous Q8H  . insulin aspart  0-15 Units Subcutaneous Q4H  . lipase/protease/amylase  2 capsule Oral TID WC  . loperamide  2 mg Oral QHS  . pantoprazole  40 mg Oral Q1200  . polycarbophil  625 mg  Oral BID  . saccharomyces boulardii  250 mg Oral BID  . Tafluprost  1 drop Ophthalmic QHS  . timolol  1 drop Both Eyes BID   Continuous Infusions: . Marland KitchenTPN (CLINIMIX-E) Adult 55 mL/hr at 08/04/13 1733   And  . fat emulsion 250 mL (08/04/13 1735)  . Marland KitchenTPN (CLINIMIX-E) Adult     And  . fat emulsion    . sodium chloride 0.9 % 1,000 mL with potassium chloride 40 mEq infusion 50 mL/hr at 08/05/13 0345   Debbora Presto, MD  [facility] Pager [phone]  If 7PM-7AM, please contact night-coverage [email or web address] Password [email or web address] 08/05/2013, 1:51 PM   LOS: 8 days

## 2013-08-06 LAB — COMPREHENSIVE METABOLIC PANEL
ALT: 19 U/L (ref 0–35)
AST: 27 U/L (ref 0–37)
Albumin: 1.9 g/dL — ABNORMAL LOW (ref 3.5–5.2)
Alkaline Phosphatase: 118 U/L — ABNORMAL HIGH (ref 39–117)
Calcium: 8.5 mg/dL (ref 8.4–10.5)
Chloride: 103 mEq/L (ref 96–112)
GFR calc Af Amer: >90 mL/min (ref >90)
GFR calc non Af Amer: >90 mL/min (ref >90)
Glucose, Bld: 113 mg/dL — ABNORMAL HIGH (ref 70–99)
Potassium: 4.5 mEq/L (ref 3.5–5.1)
Sodium: 136 mEq/L (ref 135–145)
Total Bilirubin: 0.3 mg/dL (ref 0.3–1.2)
Total Protein: 5.1 g/dL — ABNORMAL LOW (ref 6.0–8.3)

## 2013-08-06 LAB — CBC WITH DIFFERENTIAL/PLATELET
Eosinophils Absolute: 0.3 10*3/uL (ref 0.0–0.7)
HCT: 27.2 % — ABNORMAL LOW (ref 36.0–46.0)
Hemoglobin: 9.1 g/dL — ABNORMAL LOW (ref 12.0–15.0)
Lymphs Abs: 0.8 10*3/uL (ref 0.7–4.0)
MCH: 29.1 pg (ref 26.0–34.0)
MCHC: 33.5 g/dL (ref 30.0–36.0)
MCV: 86.9 fL (ref 78.0–100.0)
Monocytes Absolute: 0.9 10*3/uL (ref 0.1–1.0)
Monocytes Relative: 9 % (ref 3–12)
Neutro Abs: 8.9 10*3/uL — ABNORMAL HIGH (ref 1.7–7.7)
Neutrophils Relative %: 82 % — ABNORMAL HIGH (ref 43–77)
RBC: 3.13 MIL/uL — ABNORMAL LOW (ref 3.87–5.11)
RDW: 13.4 % (ref 11.5–15.5)

## 2013-08-06 LAB — GLUCOSE, CAPILLARY
Glucose-Capillary: 128 mg/dL — ABNORMAL HIGH (ref 70–99)
Glucose-Capillary: 107 mg/dL — ABNORMAL HIGH (ref 70–99)
Glucose-Capillary: 122 mg/dL — ABNORMAL HIGH (ref 70–99)
Glucose-Capillary: 107 mg/dL — ABNORMAL HIGH (ref 70–99)
Glucose-Capillary: 120 mg/dL — ABNORMAL HIGH (ref 70–99)

## 2013-08-06 LAB — MAGNESIUM: Magnesium: 1.9 mg/dL (ref 1.5–2.5)

## 2013-08-06 MED ORDER — TRACE MINERALS CR-CU-F-FE-I-MN-MO-SE-ZN IV SOLN
INTRAVENOUS | Status: AC
  Administered 2013-08-06: 18:00:00 via INTRAVENOUS
  Filled 2013-08-06: qty 2000

## 2013-08-06 MED ORDER — FAT EMULSION 20 % IV EMUL
250.0000 mL | INTRAVENOUS | Status: AC
  Administered 2013-08-06: 18:00:00 250 mL via INTRAVENOUS
  Filled 2013-08-06: qty 250

## 2013-08-06 MED ORDER — MAGNESIUM SULFATE IN D5W 10-5 MG/ML-% IV SOLN
1.0000 g | Freq: Once | INTRAVENOUS | Status: AC
  Administered 2013-08-06: 1 g via INTRAVENOUS
  Filled 2013-08-06: qty 100

## 2013-08-06 NOTE — Progress Notes (Signed)
PARENTERAL NUTRITION CONSULT NOTE - follow up  Pharmacy Consult for TNA Indication: Hiatal hernia, unable to use enteral nutrition  Allergies  Allergen Reactions  . Codeine     REACTION: Dlizzy   Patient Measurements: Height: 4\' 9"  (144.8 cm) Weight: 111 lb 15.9 oz (50.8 kg) IBW/kg (Calculated) : 38.6 Usual Weight: 98lb (44.5kg)  Vital Signs:  Temp: 97.5 F (36.4 C) (11/27 0516) Temp src: Oral (11/27 0516) BP: 135/81 mmHg (11/27 0516) Pulse Rate: 102 (11/27 0516) Intake/Output from previous day: 11/26 0701 - 11/27 0700 In: 3143.8 [P.O.:240; I.V.:1262.5; ZOX:0960.4] Out: 3070 [Urine:2850; Drains:220] Intake/Output from this shift:    Labs:  Recent Labs  08/04/13 0545 08/05/13 0455 08/06/13 0425  WBC 7.5 8.8 10.9*  HGB 8.5* 8.7* 9.1*  HCT 25.6* 26.1* 27.2*  PLT 231 280 345    Recent Labs  08/04/13 0545 08/04/13 1823 08/05/13 0455 08/06/13 0425  NA 139 138 139 136  K 3.3* 4.2 3.8 4.5  CL 105 104 105 103  CO2 29 27 27 28   GLUCOSE 124* 117* 111* 113*  BUN 11 11 12 14   CREATININE 0.42* 0.42* 0.42* 0.48*  CALCIUM 7.9* 8.2* 8.1* 8.5  MG 1.8  --  2.0 1.9  PHOS 3.3  --  3.5 4.0  PROT  --   --   --  5.1*  ALBUMIN  --   --   --  1.9*  AST  --   --   --  27  ALT  --   --   --  19  ALKPHOS  --   --   --  118*  BILITOT  --   --   --  0.3   Estimated Creatinine Clearance: 43.7 ml/min (by C-G formula based on Cr of 0.48).    Recent Labs  08/06/13 0010 08/06/13 0439 08/06/13 0817  GLUCAP 128* 107* 122*   Insulin Requirements in the past 24 hours:  CBGs: 103- 148  Moderate SSI: 4units  Nutritional Goals:   RD recs 11/24: 60-70 g of protein, 1300-1500 kCal, 1.3-1.5 L fluid per day.  Clinimix E 5/15 at a goal rate of 55 ml/hr + 20% fat emulsion at 8 ml/hr to provide: 66 g/day protein, 1321 Kcal/day.  Current Nutrition:   Clinimix E 5/15 at 64ml/hr + 20% fat emulsion at 64ml/hr.  Diet: FLD 11/26- eating 50% of meals  IVF: NS with KCl 40 mEq/L  at 26ml/hr.  Assessment: 42 yoF with long history of hiatal hernia s/p nissen fundoplication presents with intractable vomiting and concern for UGIB. 11/18 EGD showed possible gastric volvulus with feculant material in esophagus.  11/19 CT abd/pelvis/chest findings did not suggest gastric volvulus, but did confirm large hiatal hernia with gastric outlet obstruction.  Pt was electively placed on mechanical ventilator on 11/18 following EGD to prevent aspiration and extubated 11/22. To OR 11/20 open repair of hiatal hernia, placement of J tube, TNA ordered per pharmacy.  11/26: No esophageal leak per UGI 11/25, clear liquids started 11/26.  Energy needs re-estimated (after extubation on 11/22) and J tube out of place on 11/23 causing TF to be stopped.    Glucose - controlled  Electrolytes - K 4.5 (goal = 4 per MD), Mag = 1.9 (goal > 2 per MD).  Phos is wnl (after KPhos 10 mmol on 11/24). Corr Ca 10.2  Renal: SCr stable, net I/O (24h) = -2L  LFTs - AST/ALT wnl, Alk Phos incr to 118  TGs - 110 (11/24)  Prealbumin -  9.6 (11/24)  TPN Access: PICC TPN started: 11/20  Plan:   Patient started on full liquids 11/26, continue Clinimix E 5/15 at goal rate 55 ml/hr for now until diet further advanced and tolerating (wean as appropriate)  Change to 20% fat emulsion at 8 ml/hr daily  TNA to contain standard multivitamins and trace elements.  Per MD goals, will give magnesium sulfate 1gm IVPB x 1 today (to keep Mg >2)  Continue IVF of NS w/ 40 KCl at 61ml/hr per MD.  Cont Moderate SSI q4h.   TNA lab panels on Mondays & Thursdays.   F/u daily.   Tomi Bamberger, PharmD, BCPS Clinical Pharmacist Pager: [phone] Pharmacy: [phone] 08/06/2013 9:40 AM

## 2013-08-06 NOTE — Progress Notes (Signed)
Patient's G-tube with residual less than 30cc and flushed with air as ordered. Tolerating PO diet. Will continue to monitor.

## 2013-08-06 NOTE — Progress Notes (Signed)
D- Heart rate ranges in the low 100's since admission, last night had periods in the low 30's.  A- Patient was asymptomatic on assessment, on-call NP was notified  R- K. Schorr, NP responded and will continue to monitor patient.  Ernesta Amble, RN

## 2013-08-06 NOTE — Progress Notes (Signed)
Patient ID: [name], female   DOB: 02/18/1941, 72 y.o.   MRN: [id number]  [facility] PROGRESS NOTE  TAMILYN LUPIEN OZH:[id number] DOB: [birth year] DOA: 07/28/2013 PCP: Hollice Espy, MD  Brief narrative:  89 F with large HH admitted 11/18 by [facility] with intractable vomiting and concern for UGIB. Underwent EGD 11/19 revealing possible gastric volvulus. Intubated after EGD to protect airway and permit completion of evaluation.   Assessment/Plan:  #1recurrent recurrent large hiatal hernia/gastric outlet obstruction/esophageal perforation/gastric bezoar  - Status post laparotomy 07/30/2013. Patient passing gas, with loose stools.  - Patient on TPN per pharmacy. Continue PPI.  - management per primary team  #2 hypokalemia./Hypomagnesemia  - Likely secondary to GI losses.  - supplemented and within normal limits this AM  #3 acute respiratory failure  - Secondary to compromised airway. Resolved. Extubated 07/31/2013  - Continue pulmonary hygiene. Mobilize. - Keep sats greater than 92%. If worsens clinically may need to repeat chest x-ray  #4 tachycardia  - Postop pain. Improved.  #5 acute encephalopathy  - Resolved.  #6 hypertension  - BP medications held initially  - SBP in 140's  - will start to resume home BP medications  #7 chronic pancreatitis  - Patient has been started on clears. Resume preop.  #8 glaucoma  - Continue eyedrops.  #9 prophylaxis  - Protonix for GI prophylaxis. SCDs for DVT prophylaxis.  #10 Anemia of chronic disease  - Hg and Hct overall stable  #11 Leukocytosis - unclear etiology but also with mild tachycardia  - if it persists will check UA and   Code Status: Full  Family Communication: Discussed with patient no family at bedside.  Disposition Plan: Per primary team   Consultants:  PC CM.  Gastroenterology: Dr. Christella Hartigan Procedures:  ETT 11/19 >>11/21  RUE PICC 11/19>>>  GJ Tube11-20>>>  11/19 - EGD: stomach was filled with solid brown, feculent  appearing material. The gastric anatomy was very abnormal, distorted and twisted  11/19 - CT chest/abd/pelvis:  11/20 - Exp lap (Rosenbower): Large hiatal hernia, gastric outlet obstruction, gastric volvulus, bezoar, esophageal perforation. PROCEDURE: lysis of adhesions, reduction of hiatal hernia, repair of esophageal perforation, Dor fundoplication, evacuation of gastric bezoar, placement of the gastrostomy-jejunostomy tube  11/22 - Extubated 11/21 Deconditioned. In pain : needing fent q2h but able to sit in chair.  Antibiotics:  Cefazolin 11/20 perioperative   HPI/Subjective: No events overnight.   Objective: Filed Vitals:   08/05/13 1412 08/05/13 2228 08/06/13 0307 08/06/13 0516  BP: 137/83 133/83 136/76 135/81  Pulse: 98 117  102  Temp: 97.4 F (36.3 C) 98.5 F (36.9 C)  97.5 F (36.4 C)  TempSrc: Oral Oral  Oral  Resp: 20 20  20   Height:      Weight:    50.8 kg (111 lb 15.9 oz)  SpO2: 100% 97%  95%    Intake/Output Summary (Last 24 hours) at 08/06/13 1056 Last data filed at 08/06/13 1032  Gross per 24 hour  Intake 3023.75 ml  Output   3210 ml  Net -186.25 ml    Exam:   General:  Pt is alert, follows commands appropriately, not in acute distress  Cardiovascular: Regular rhythm, tachycardic, S1/S2, no murmurs, no rubs, no gallops  Respiratory: Clear to auscultation bilaterally, no wheezing, no crackles, no rhonchi  Abdomen: Soft, non tender, no guarding  Extremities: No edema, pulses DP and PT palpable bilaterally  Neuro: Grossly nonfocal  Data Reviewed: Basic Metabolic Panel:  Recent Labs Lab 08/01/13 0530  08/03/13 0620 08/03/13 1800 08/04/13 0545 08/04/13 1823 08/05/13 0455 08/06/13 0425  NA 142  < > 141 140 139 138 139 136  K 3.6  < > 3.2* 3.8 3.3* 4.2 3.8 4.5  CL 114*  < > 108 107 105 104 105 103  CO2 23  < > 28 28 29 27 27 28   GLUCOSE 138*  < > 82 121* 124* 117* 111* 113*  BUN 33*  < > 15 13 11 11 12 14   CREATININE 0.67  < > 0.44* 0.41*  0.42* 0.42* 0.42* 0.48*  CALCIUM 8.1*  < > 8.2* 8.0* 7.9* 8.2* 8.1* 8.5  MG 1.8  --  1.7  --  1.8  --  2.0 1.9  PHOS 2.3  --  2.2*  --  3.3  --  3.5 4.0  < > = values in this interval not displayed. Liver Function Tests:  Recent Labs Lab 08/03/13 0620 08/06/13 0425  AST 18 27  ALT 11 19  ALKPHOS 87 118*  BILITOT 0.3 0.3  PROT 5.0* 5.1*  ALBUMIN 1.8* 1.9*   CBC:  Recent Labs Lab 08/02/13 1537 08/03/13 0620 08/04/13 0545 08/05/13 0455 08/06/13 0425  WBC 10.4 9.5 7.5 8.8 10.9*  NEUTROABS  --  7.7 5.7 6.9 8.9*  HGB 8.6* 8.8* 8.5* 8.7* 9.1*  HCT 25.7* 26.3* 25.6* 26.1* 27.2*  MCV 88.0 87.1 86.8 87.0 86.9  PLT 196 203 231 280 345   CBG:  Recent Labs Lab 08/05/13 1619 08/05/13 1956 08/06/13 0010 08/06/13 0439 08/06/13 0817  GLUCAP 89 115* 128* 107* 122*    Recent Results (from the past 240 hour(s))  MRSA PCR SCREENING     Status: None   Collection Time    07/28/13  1:37 PM      Result Value Range Status   MRSA by PCR NEGATIVE  NEGATIVE Final   Comment:            The GeneXpert MRSA Assay (FDA     approved for NASAL specimens     only), is one component of a     comprehensive MRSA colonization     surveillance program. It is not     intended to diagnose MRSA     infection nor to guide or     monitor treatment for     MRSA infections.  CLOSTRIDIUM DIFFICILE BY PCR     Status: None   Collection Time    08/03/13  4:18 PM      Result Value Range Status   C difficile by pcr NEGATIVE  NEGATIVE Final   Comment: Performed at [facility]     Scheduled Meds: . antiseptic oral rinse  15 mL Mouth Rinse QID  . carboxymethylcellulose  1 drop Both Eyes TID WC & HS  . [employer] EYE LUBRICANT NIGHTTIME  1 drop Ophthalmic QHS  . ferrous sulfate  325 mg Oral TID WC  . heparin subcutaneous  5,000 Units Subcutaneous Q8H  . insulin aspart  0-15 Units Subcutaneous Q4H  . lip balm  1 application Topical BID  . lipase/protease/amylase  2 capsule Oral TID WC  .  loperamide  2 mg Oral QHS  . magnesium sulfate 1 - 4 g bolus IVPB  1 g Intravenous Once  . pantoprazole  40 mg Oral Q1200  . polycarbophil  625 mg Oral BID  . saccharomyces boulardii  250 mg Oral BID  . sodium chloride  10-40 mL Intracatheter Q12H  . Tafluprost  1  drop Ophthalmic QHS  . timolol  1 drop Both Eyes BID   Continuous Infusions: . Marland KitchenTPN (CLINIMIX-E) Adult 55 mL/hr at 08/05/13 1732   And  . fat emulsion 250 mL (08/05/13 1732)  . Marland KitchenTPN (CLINIMIX-E) Adult     And  . fat emulsion    . sodium chloride 0.9 % 1,000 mL with potassium chloride 40 mEq infusion 50 mL/hr at 08/06/13 0202    Debbora Presto, MD  [facility] Pager [phone]  If 7PM-7AM, please contact night-coverage [email or web address] Password [email or web address] 08/06/2013, 10:56 AM   LOS: 9 days

## 2013-08-06 NOTE — Progress Notes (Signed)
Patient ID: [name], female   DOB: May 16, 1941, 72 y.o.   MRN: [id number]  General Surgery - [facility] - Progress Note  POD# 7  Subjective: Patient up in chair.  Tolerating full liquid diet - about 50%.  Loose BM's.  Objective: Vital signs in last 24 hours: Temp:  [97.4 F (36.3 C)-98.5 F (36.9 C)] 97.5 F (36.4 C) (11/27 0516) Pulse Rate:  [98-117] 102 (11/27 0516) Resp:  [20] 20 (11/27 0516) BP: (133-137)/(76-83) 135/81 mmHg (11/27 0516) SpO2:  [95 %-100 %] 95 % (11/27 0516) Weight:  [111 lb 15.9 oz (50.8 kg)] 111 lb 15.9 oz (50.8 kg) (11/27 0516) Last BM Date: 08/06/13  Intake/Output from previous day: 11/26 0701 - 11/27 0700 In: 3143.8 [P.O.:240; I.V.:1262.5; TPN:1641.3] Out: 3070 [Urine:2850; Drains:220]  Exam: HEENT - clear, not icteric Neck - soft Chest - clear bilaterally Cor - RRR, rate about 100 Abd - soft, wound clear and dry, BS present; drain with moderate serous fluid Ext - no significant edema Neuro - grossly intact, no focal deficits  Lab Results:   Recent Labs  08/05/13 0455 08/06/13 0425  WBC 8.8 10.9*  HGB 8.7* 9.1*  HCT 26.1* 27.2*  PLT 280 345     Recent Labs  08/05/13 0455 08/06/13 0425  NA 139 136  K 3.8 4.5  CL 105 103  CO2 27 28  GLUCOSE 111* 113*  BUN 12 14  CREATININE 0.42* 0.48*  CALCIUM 8.1* 8.5    Studies/Results: Dg Ugi W/water Sol Cm  08/04/2013   CLINICAL DATA:  Large hiatal hernia with gastric volvulus status post reduction, fundoplication, repair of esophageal perforation, and placement of gastrojejunostomy tube on 07/2013. Contrast swallow to rule out esophageal leak.  EXAM: WATER SOLUBLE UPPER GI SERIES  TECHNIQUE: Single-column upper GI series was performed using water soluble contrast.  CONTRAST:  95mL OMNIPAQUE IOHEXOL 300 MG/ML  SOLN  COMPARISON:  None.  FLUOROSCOPY TIME:  3 min 18 seconds  FINDINGS: Skin staples, surgical clips, surgical drain, and GJ tube overlie the upper abdomen. The  patient drank contrast without difficulty. Contrast passed readily through the esophagus and into the stomach. There is no evidence of esophageal leak or obstruction. Limited evaluation of the stomach demonstrated a normal intra-abdominal location and configuration, with contrast seen to pass into proximal small bowel.  IMPRESSION: No evidence of esophageal leak.   Electronically Signed   By: Sebastian Ache   On: 08/04/2013 13:12    Assessment / Plan: 1.  Status post reduction of gastric volvulus with G-tube placement  Full liquid diet  On TNA, may discontinue if calorie counts adequate  Leave drain in place for now with high output  Encourage OOB, ambulation  May consider short term Rehab upon discharge  Velora Heckler, MD, [facility] Office: [phone]  08/06/2013

## 2013-08-07 LAB — GLUCOSE, CAPILLARY
Glucose-Capillary: 103 mg/dL — ABNORMAL HIGH (ref 70–99)
Glucose-Capillary: 107 mg/dL — ABNORMAL HIGH (ref 70–99)
Glucose-Capillary: 116 mg/dL — ABNORMAL HIGH (ref 70–99)
Glucose-Capillary: 119 mg/dL — ABNORMAL HIGH (ref 70–99)
Glucose-Capillary: 121 mg/dL — ABNORMAL HIGH (ref 70–99)
Glucose-Capillary: 125 mg/dL — ABNORMAL HIGH (ref 70–99)
Glucose-Capillary: 144 mg/dL — ABNORMAL HIGH (ref 70–99)

## 2013-08-07 LAB — BASIC METABOLIC PANEL
BUN: 15 mg/dL (ref 6–23)
CO2: 27 mEq/L (ref 19–32)
Chloride: 106 mEq/L (ref 96–112)
GFR calc non Af Amer: >90 mL/min (ref >90)
Glucose, Bld: 119 mg/dL — ABNORMAL HIGH (ref 70–99)
Potassium: 4.1 mEq/L (ref 3.5–5.1)

## 2013-08-07 LAB — CBC
HCT: 27.2 % — ABNORMAL LOW (ref 36.0–46.0)
Hemoglobin: 9 g/dL — ABNORMAL LOW (ref 12.0–15.0)
MCH: 29.1 pg (ref 26.0–34.0)
MCHC: 33.1 g/dL (ref 30.0–36.0)
MCV: 88 fL (ref 78.0–100.0)
Platelets: 388 10*3/uL (ref 150–400)
RBC: 3.09 MIL/uL — ABNORMAL LOW (ref 3.87–5.11)
RDW: 13.7 % (ref 11.5–15.5)
WBC: 10.9 10*3/uL — ABNORMAL HIGH (ref 4.0–10.5)

## 2013-08-07 MED ORDER — FAT EMULSION 20 % IV EMUL
250.0000 mL | INTRAVENOUS | Status: AC
  Administered 2013-08-07: 250 mL via INTRAVENOUS
  Filled 2013-08-07: qty 250

## 2013-08-07 MED ORDER — TRACE MINERALS CR-CU-F-FE-I-MN-MO-SE-ZN IV SOLN
INTRAVENOUS | Status: AC
  Administered 2013-08-07: 18:00:00 via INTRAVENOUS
  Filled 2013-08-07: qty 1000

## 2013-08-07 NOTE — Progress Notes (Signed)
Patient ID: [name], female   DOB: [birth year], 72 y.o.   MRN: [id number] [facility] PROGRESS NOTE  [name]:[id number] DOB: 06-May-1941 DOA: 07/28/2013 PCP: Hollice Espy, MD  Brief narrative:  66 F with large HH admitted 11/18 by [facility] with intractable vomiting and concern for UGIB. Underwent EGD 11/19 revealing possible gastric volvulus. Intubated after EGD to protect airway and permit completion of evaluation.   Assessment/Plan:  #1recurrent recurrent large hiatal hernia/gastric outlet obstruction/esophageal perforation/gastric bezoar  - Status post laparotomy 07/30/2013. Patient passing gas, with loose stools.  - Patient on TPN per pharmacy. Continue PPI.  - management per primary team  #2 hypokalemia./Hypomagnesemia  - Likely secondary to GI losses.  - supplemented and within normal limits this AM  #3 acute respiratory failure  - Secondary to compromised airway. Resolved. Extubated 07/31/2013  - Continue pulmonary hygiene. Mobilize.  - Keep sats greater than 92%. If worsens clinically may need to repeat chest x-ray  #4 tachycardia  - Postop pain. Still tachycardic but clinically stable and no chest pain this AM  #5 acute encephalopathy  - Resolved.  #6 hypertension  - reasonable inpatient control on no antihypertensives - continue to hold home BP medications  #7 chronic pancreatitis  - Patient has been started on clears. Resume preop.  #8 glaucoma  - Continue eyedrops.  #9 prophylaxis  - Protonix for GI prophylaxis. SCDs for DVT prophylaxis.  #10 Anemia of chronic disease  - Hg and Hct overall stable but CBC pending this AM #11 Leukocytosis  - unclear etiology but also with mild tachycardia  - CBC pending this AM - if it persists will check UA and CXR  Code Status: Full  Family Communication: Discussed with patient no family at bedside.  Disposition Plan: Per primary team   Consultants:  PCCM Gastroenterology: Dr. Christella Hartigan Procedures:  ETT 11/19  >>11/21  RUE PICC 11/19>>>  GJ Tube11-20>>>  11/19 - EGD: stomach was filled with solid brown, feculent appearing material. The gastric anatomy was very abnormal, distorted and twisted  11/19 - CT chest/abd/pelvis:  11/20 - Exp lap (Rosenbower): Large hiatal hernia, gastric outlet obstruction, gastric volvulus, bezoar, esophageal perforation. PROCEDURE: lysis of adhesions, reduction of hiatal hernia, repair of esophageal perforation, Dor fundoplication, evacuation of gastric bezoar, placement of the gastrostomy-jejunostomy tube  11/22 - Extubated 11/21 Deconditioned. In pain : needing fent q2h but able to sit in chair.  Antibiotics:  Cefazolin 11/20 perioperative   HPI/Subjective: No events overnight.   Objective: Filed Vitals:   08/06/13 0516 08/06/13 1326 08/06/13 2052 08/07/13 0545  BP: 135/81 125/75 129/78 123/74  Pulse: 102 109 105 119  Temp: 97.5 F (36.4 C) 98.5 F (36.9 C) 98 F (36.7 C) 97.6 F (36.4 C)  TempSrc: Oral Oral Oral Oral  Resp: 20 20 20 20   Height:      Weight: 50.8 kg (111 lb 15.9 oz)   48.7 kg (107 lb 5.8 oz)  SpO2: 95% 100% 97% 97%    Intake/Output Summary (Last 24 hours) at 08/07/13 0649 Last data filed at 08/07/13 9562  Gross per 24 hour  Intake 3674.08 ml  Output   3165 ml  Net 509.08 ml    Exam:   General:  Pt is alert, follows commands appropriately, not in acute distress  Cardiovascular: Regular rate and rhythm, S1/S2, no murmurs, no rubs, no gallops  Respiratory: Clear to auscultation bilaterally, no wheezing, diminished breath sounds at baess   Abdomen: Soft, non tender, non distended, no guarding  Extremities: No edema, pulses DP and PT palpable bilaterally   Data Reviewed: Basic Metabolic Panel:  Recent Labs Lab 08/01/13 0530  08/03/13 0620 08/03/13 1800 08/04/13 0545 08/04/13 1823 08/05/13 0455 08/06/13 0425  NA 142  < > 141 140 139 138 139 136  K 3.6  < > 3.2* 3.8 3.3* 4.2 3.8 4.5  CL 114*  < > 108 107 105 104 105  103  CO2 23  < > 28 28 29 27 27 28   GLUCOSE 138*  < > 82 121* 124* 117* 111* 113*  BUN 33*  < > 15 13 11 11 12 14   CREATININE 0.67  < > 0.44* 0.41* 0.42* 0.42* 0.42* 0.48*  CALCIUM 8.1*  < > 8.2* 8.0* 7.9* 8.2* 8.1* 8.5  MG 1.8  --  1.7  --  1.8  --  2.0 1.9  PHOS 2.3  --  2.2*  --  3.3  --  3.5 4.0  < > = values in this interval not displayed. Liver Function Tests:  Recent Labs Lab 08/03/13 0620 08/06/13 0425  AST 18 27  ALT 11 19  ALKPHOS 87 118*  BILITOT 0.3 0.3  PROT 5.0* 5.1*  ALBUMIN 1.8* 1.9*   CBC:  Recent Labs Lab 08/02/13 1537 08/03/13 0620 08/04/13 0545 08/05/13 0455 08/06/13 0425  WBC 10.4 9.5 7.5 8.8 10.9*  NEUTROABS  --  7.7 5.7 6.9 8.9*  HGB 8.6* 8.8* 8.5* 8.7* 9.1*  HCT 25.7* 26.3* 25.6* 26.1* 27.2*  MCV 88.0 87.1 86.8 87.0 86.9  PLT 196 203 231 280 345   CBG:  Recent Labs Lab 08/06/13 0817 08/06/13 1130 08/06/13 1658 08/06/13 2050 08/07/13 0040  GLUCAP 122* 117* 107* 120* 121*    Recent Results (from the past 240 hour(s))  MRSA PCR SCREENING     Status: None   Collection Time    07/28/13  1:37 PM      Result Value Range Status   MRSA by PCR NEGATIVE  NEGATIVE Final   Comment:            The GeneXpert MRSA Assay (FDA     approved for NASAL specimens     only), is one component of a     comprehensive MRSA colonization     surveillance program. It is not     intended to diagnose MRSA     infection nor to guide or     monitor treatment for     MRSA infections.  CLOSTRIDIUM DIFFICILE BY PCR     Status: None   Collection Time    08/03/13  4:18 PM      Result Value Range Status   C difficile by pcr NEGATIVE  NEGATIVE Final   Comment: Performed at [facility]     Scheduled Meds: . carboxymethylcellulose  1 drop Both Eyes TID WC & HS  . ferrous sulfate  325 mg Oral TID WC  . heparin subcutaneous  5,000 Units Subcutaneous Q8H  . insulin aspart  0-15 Units Subcutaneous Q4H  . lipase/protease/amylase  2 capsule Oral TID WC   . loperamide  2 mg Oral QHS  . pantoprazole  40 mg Oral Q1200  . polycarbophil  625 mg Oral BID  . saccharomyces boulardii  250 mg Oral BID  . Tafluprost  1 drop Ophthalmic QHS  . timolol  1 drop Both Eyes BID   Continuous Infusions: . Marland KitchenTPN (CLINIMIX-E) Adult 55 mL/hr at 08/06/13 1733   And  . fat emulsion  250 mL (08/06/13 1733)  . sodium chloride 0.9 % 1,000 mL with potassium chloride 40 mEq infusion 50 mL/hr at 08/06/13 2223   Debbora Presto, MD  [facility] Pager [phone]  If 7PM-7AM, please contact night-coverage [email or web address] Password [email or web address] 08/07/2013, 6:49 AM   LOS: 10 days

## 2013-08-07 NOTE — Progress Notes (Signed)
Physical Therapy Treatment Patient Details Name: [name] MRN: [id number] DOB: March 11, 1941 Today's Date: 08/07/2013 Time: 1191-4782 PT Time Calculation (min): 16 min  PT Assessment / Plan / Recommendation  History of Present Illness 48 F with large HH adm 11/18 by [facility] with intractable vomiting and concern for UGIB. Underwent EGD 11/19 revealing possible gastric volvulus. Intubated after EGD to protect airway and permit completion of eval.11-20 Exp lap (Rosenbower): Large hiatal hernia, gastric outlet obstruction, gastric volvulus, bezoar, esophageal perforation. PROCEDURE: lysis of adhesions, reduction of hiatal hernia, repair of esophageal perforation, Dor fundoplication, evacuation of gastric bezoar, placement of the gastrostomy-jejunostomy tube   PT Comments   Pt made good progress this session and goals updated.  Follow Up Recommendations  SNF     Does the patient have the potential to tolerate intense rehabilitation     Barriers to Discharge        Equipment Recommendations  None recommended by PT    Recommendations for Other Services    Frequency Min 3X/week   Progress towards PT Goals Progress towards PT goals: Goals met and updated - see care plan  Plan Current plan remains appropriate    Precautions / Restrictions Precautions Precautions: Fall;Other (comment) Precaution Comments: abd incision; JP drain   Pertinent Vitals/Pain None at rest    Mobility  Bed Mobility Bed Mobility: Sit to Supine Sit to Supine: 3: Mod assist;HOB flat Details for Bed Mobility Assistance: discussed laying down on side for log roll techinque however pt just laid back so assist LEs onto bed Transfers Transfers: Sit to Stand;Stand to Sit;Stand Pivot Transfers Sit to Stand: 4: Min guard;With upper extremity assist;From chair/3-in-1 Stand to Sit: 4: Min guard;With upper extremity assist;To chair/3-in-1;To bed Stand Pivot Transfers: 4: Min guard Details for Transfer Assistance: verbal cues  for hand placement Ambulation/Gait Ambulation/Gait Assistance: 4: Min guard Ambulation Distance (Feet): 200 Feet Assistive device: Rolling walker Ambulation/Gait Assistance Details: pt able to tolerate increased ambulation distance today using RW, verbal cues for posture Gait Pattern: Step-through pattern;Trunk flexed;Decreased stride length Gait velocity: decr    Exercises     PT Diagnosis:    PT Problem List:   PT Treatment Interventions:     PT Goals (current goals can now be found in the care plan section)    Visit Information  Last PT Received On: 08/07/13 Assistance Needed: +1 History of Present Illness: 26 F with large HH adm 11/18 by [facility] with intractable vomiting and concern for UGIB. Underwent EGD 11/19 revealing possible gastric volvulus. Intubated after EGD to protect airway and permit completion of eval.11-20 Exp lap (Rosenbower): Large hiatal hernia, gastric outlet obstruction, gastric volvulus, bezoar, esophageal perforation. PROCEDURE: lysis of adhesions, reduction of hiatal hernia, repair of esophageal perforation, Dor fundoplication, evacuation of gastric bezoar, placement of the gastrostomy-jejunostomy tube    Subjective Data      Cognition  Cognition Arousal/Alertness: Lethargic Behavior During Therapy: WFL for tasks assessed/performed Overall Cognitive Status: Within Functional Limits for tasks assessed    Balance     End of Session PT - End of Session Equipment Utilized During Treatment: Gait belt Activity Tolerance: Patient tolerated treatment well Patient left: in bed;with call bell/phone within reach   GP     Dyshaun Bonzo,KATHrine E 08/07/2013, 4:12 PM Zenovia Jarred, PT, DPT 08/07/2013 Pager: [phone]

## 2013-08-07 NOTE — Evaluation (Signed)
Occupational Therapy Evaluation Patient Details Name: [name] MRN: [id number] DOB: [birth year] Today's Date: 08/07/2013 Time: 2725-3664 OT Time Calculation (min): 24 min  OT Assessment / Plan / Recommendation History of present illness 62 F with large HH adm 11/18 by [facility] with intractable vomiting and concern for UGIB. Underwent EGD 11/19 revealing possible gastric volvulus. Intubated after EGD to protect airway and permit completion of eval.11-20 Exp lap (Rosenbower): Large hiatal hernia, gastric outlet obstruction, gastric volvulus, bezoar, esophageal perforation. PROCEDURE: lysis of adhesions, reduction of hiatal hernia, repair of esophageal perforation, Dor fundoplication, evacuation of gastric bezoar, placement of the gastrostomy-jejunostomy tube   Clinical Impression   Pt admitted with above.  She presents to OT with the below listed deficits and will benefit from continued OT to maximize safety and independence with BADLs.  She is very motivated and should progress well.  She will require 24 hour supervision at discharge.  At this time recommend SNF level rehab    OT Assessment  Patient needs continued OT Services    Follow Up Recommendations  SNF;Supervision/Assistance - 24 hour    Barriers to Discharge Decreased caregiver support    Equipment Recommendations  None recommended by OT    Recommendations for Other Services    Frequency  Min 2X/week    Precautions / Restrictions Precautions Precautions: Fall;Other (comment) Precaution Comments: abd incision; JP drain   Pertinent Vitals/Pain     ADL  Eating/Feeding: Independent Where Assessed - Eating/Feeding: Chair Grooming: Wash/dry hands;Denture care;Min guard Where Assessed - Grooming: Supported standing Upper Body Bathing: Supervision/safety;Set up Where Assessed - Upper Body Bathing: Supported sitting;Unsupported sitting Lower Body Bathing: Minimal assistance Where Assessed - Lower Body Bathing: Supported sit to  stand Upper Body Dressing: Supervision/safety;Set up Where Assessed - Upper Body Dressing: Unsupported sitting Lower Body Dressing: Minimal assistance Where Assessed - Lower Body Dressing: Supported sit to stand Toilet Transfer: Hydrographic surveyor Method: Sit to stand;Stand pivot Acupuncturist: Materials engineer and Hygiene: Min guard Where Assessed - Engineer, mining and Hygiene: Standing Equipment Used: Rolling walker Transfers/Ambulation Related to ADLs: min guard assist ADL Comments: Pt is very motivated.  Min difficulty accessing feet    OT Diagnosis: Generalized weakness  OT Problem List: Decreased strength;Decreased activity tolerance;Impaired balance (sitting and/or standing);Decreased knowledge of use of DME or AE;Decreased knowledge of precautions OT Treatment Interventions: Self-care/ADL training;DME and/or AE instruction;Therapeutic activities;Patient/family education;Balance training   OT Goals(Current goals can be found in the care plan section) Acute Rehab OT Goals Patient Stated Goal: To regain independence OT Goal Formulation: With patient Time For Goal Achievement: 08/21/13 Potential to Achieve Goals: Good ADL Goals Pt Will Perform Grooming: with modified independence;standing Pt Will Perform Upper Body Bathing: with modified independence;sitting Pt Will Perform Lower Body Bathing: with modified independence;sit to/from stand Pt Will Perform Upper Body Dressing: with modified independence;sitting Pt Will Perform Lower Body Dressing: with modified independence;sit to/from stand Pt Will Transfer to Toilet: with modified independence;ambulating;regular height toilet;bedside commode;grab bars Pt Will Perform Toileting - Clothing Manipulation and hygiene: with modified independence;sit to/from stand Pt Will Perform Tub/Shower Transfer: Tub transfer;with min guard assist;ambulating;rolling walker;shower  seat  Visit Information  Last OT Received On: 08/07/13 Assistance Needed: +1 History of Present Illness: 63 F with large HH adm 11/18 by [facility] with intractable vomiting and concern for UGIB. Underwent EGD 11/19 revealing possible gastric volvulus. Intubated after EGD to protect airway and permit completion of eval.11-20 Exp lap (Rosenbower): Large hiatal hernia, gastric  outlet obstruction, gastric volvulus, bezoar, esophageal perforation. PROCEDURE: lysis of adhesions, reduction of hiatal hernia, repair of esophageal perforation, Dor fundoplication, evacuation of gastric bezoar, placement of the gastrostomy-jejunostomy tube       Prior Functioning     Home Living Family/patient expects to be discharged to:: Private residence Living Arrangements: Children (plans to possibly discharge to dtrs.) Available Help at Discharge: Family;Available 24 hours/day Type of Home: House Home Access: Stairs to enter Home Layout: Two level (dtrs home) Home Equipment: None Prior Function Level of Independence: Independent Communication Communication: No difficulties         Vision/Perception     Cognition  Cognition Arousal/Alertness: Awake/alert Behavior During Therapy: WFL for tasks assessed/performed Overall Cognitive Status: Within Functional Limits for tasks assessed    Extremity/Trunk Assessment Upper Extremity Assessment Upper Extremity Assessment: Generalized weakness Lower Extremity Assessment Lower Extremity Assessment: Defer to PT evaluation Cervical / Trunk Assessment Cervical / Trunk Assessment: Other exceptions (scoliosis and kyphosis)     Mobility Bed Mobility Bed Mobility: Rolling Left;Left Sidelying to Sit;Sitting - Scoot to Edge of Bed;Sit to Sidelying Left Rolling Left: 5: Supervision;With rail Left Sidelying to Sit: 4: Min assist;With rails;HOB flat Sitting - Scoot to Edge of Bed: 4: Min guard Sit to Supine: 3: Mod assist;HOB flat Sit to Sidelying Left: 4: Min  guard;With rail;HOB flat Details for Bed Mobility Assistance: verbal cues for technique Transfers Transfers: Sit to Stand;Stand to Sit Sit to Stand: 4: Min guard;With upper extremity assist;From bed;From chair/3-in-1 Stand to Sit: 4: Min guard;With upper extremity assist;To bed;To chair/3-in-1 Details for Transfer Assistance: min guard for safety     Exercise     Balance Balance Balance Assessed: Yes Dynamic Sitting Balance Dynamic Sitting - Balance Support: Feet supported Dynamic Sitting - Level of Assistance: 5: Stand by assistance Dynamic Sitting - Balance Activities: Other (comment) (LB ADLs) Dynamic Standing Balance Dynamic Standing - Balance Support: Left upper extremity supported Dynamic Standing - Level of Assistance: 5: Stand by assistance Dynamic Standing - Balance Activities: Other (comment) (grooming at sink)   End of Session OT - End of Session Equipment Utilized During Treatment: Rolling walker Activity Tolerance: Patient tolerated treatment well Patient left: in bed;with call bell/phone within reach  GO     Marigold Mom M 08/07/2013, 5:41 PM

## 2013-08-07 NOTE — Progress Notes (Signed)
Patient ID: [name], female   DOB: 09-Sep-1941, 72 y.o.   MRN: [id number]  General Surgery - [facility] - Progress Note  POD# 8  Subjective: Patient up to chair this AM, now back in bed.  Ambulated in hall yesterday.  Tolerating some full liquid diet.  On TNA.  Objective: Vital signs in last 24 hours: Temp:  [97.6 F (36.4 C)-98.5 F (36.9 C)] 97.6 F (36.4 C) (11/28 0545) Pulse Rate:  [105-119] 119 (11/28 0545) Resp:  [20] 20 (11/28 0545) BP: (123-129)/(74-78) 123/74 mmHg (11/28 0545) SpO2:  [97 %-100 %] 97 % (11/28 0545) Weight:  [107 lb 5.8 oz (48.7 kg)] 107 lb 5.8 oz (48.7 kg) (11/28 0545) Last BM Date: 08/06/13  Intake/Output from previous day: 11/27 0701 - 11/28 0700 In: 3310 [P.O.:360; I.V.:1200; IV Piggyback:100; TPN:1560] Out: 3165 [Urine:2950; Drains:215]  Exam: HEENT - clear, not icteric Neck - soft Chest - clear bilaterally Cor - RRR, no murmur Abd - soft with mild distension; G-tube LUQ; JP drain with thin serous output; wound clear and dry Ext - no significant edema Neuro - grossly intact, no focal deficits  Lab Results:   Recent Labs  08/05/13 0455 08/06/13 0425  WBC 8.8 10.9*  HGB 8.7* 9.1*  HCT 26.1* 27.2*  PLT 280 345     Recent Labs  08/06/13 0425 08/07/13 0550  NA 136 139  K 4.5 4.1  CL 103 106  CO2 28 27  GLUCOSE 113* 119*  BUN 14 15  CREATININE 0.48* 0.46*  CALCIUM 8.5 8.1*    Studies/Results: No results found.  Assessment / Plan: 1.  Status post reduction gastric volvulus with G-tube placement  Continue FL diet  Continue TNA  Encourage OOB, ambulation  Monitor wound and drainage  Velora Heckler, MD, [facility] Office: [phone]  08/07/2013

## 2013-08-07 NOTE — Clinical Social Work Psychosocial (Signed)
     Clinical Social Work Department BRIEF PSYCHOSOCIAL ASSESSMENT 08/07/2013  Patient:  [name], [name]     Account Number:  [id number]     Admit date:  07/28/2013  Clinical Social Worker:  Hattie Perch  Date/Time:  08/07/2013 12:00 M  Referred by:  Physician  Date Referred:  08/07/2013 Referred for  SNF Placement   Other Referral:   Interview type:  Patient Other interview type:    PSYCHOSOCIAL DATA Living Status:  ALONE Admitted from facility:   Level of care:   Primary support name:  debbie Simington Primary support relationship to patient:  CHILD, ADULT Degree of support available:   good    CURRENT CONCERNS Current Concerns  Post-Acute Placement   Other Concerns:    SOCIAL WORK ASSESSMENT / PLAN CSW met with patient. patient is alert and oriented X3. CSW discussed with patient the possibility of snf placement. patient states that she is planning to go to her daughters home in [place] and have some home health there. she does not want to go to snf and doesnt think that she needs to.   Assessment/plan status:   Other assessment/ plan:   Information/referral to community resources:    PATIENTS/FAMILYS RESPONSE TO PLAN OF CARE: patient is friendly and cooperative but patient is not interested in snf placement at this time. patient is hopeful that she will be able to go to her daughters home instead. If patient changes her mind, please feel free to reconsult.

## 2013-08-07 NOTE — Progress Notes (Signed)
PARENTERAL NUTRITION CONSULT NOTE - follow up  Pharmacy Consult for TNA Indication: Hiatal hernia, unable to use enteral nutrition  Allergies  Allergen Reactions  . Codeine     REACTION: Dlizzy   Patient Measurements: Height: 4\' 9"  (144.8 cm) Weight: 107 lb 5.8 oz (48.7 kg) IBW/kg (Calculated) : 38.6 Usual Weight: 98lb (44.5kg)  Vital Signs:  Temp: 97.6 F (36.4 C) (11/28 0545) Temp src: Oral (11/28 0545) BP: 123/74 mmHg (11/28 0545) Pulse Rate: 119 (11/28 0545) Intake/Output from previous day: 11/27 0701 - 11/28 0700 In: 2126.8 [P.O.:360; I.V.:800; IV Piggyback:100; TPN:776.8] Out: 3165 [Urine:2950; Drains:215] Intake/Output from this shift:    Labs:  Recent Labs  08/05/13 0455 08/06/13 0425  WBC 8.8 10.9*  HGB 8.7* 9.1*  HCT 26.1* 27.2*  PLT 280 345    Recent Labs  08/04/13 1823 08/05/13 0455 08/06/13 0425  NA 138 139 136  K 4.2 3.8 4.5  CL 104 105 103  CO2 27 27 28   GLUCOSE 117* 111* 113*  BUN 11 12 14   CREATININE 0.42* 0.42* 0.48*  CALCIUM 8.2* 8.1* 8.5  MG  --  2.0 1.9  PHOS  --  3.5 4.0  PROT  --   --  5.1*  ALBUMIN  --   --  1.9*  AST  --   --  27  ALT  --   --  19  ALKPHOS  --   --  118*  BILITOT  --   --  0.3   Estimated Creatinine Clearance: 42.7 ml/min (by C-G formula based on Cr of 0.48).    Recent Labs  08/06/13 1658 08/06/13 2050 08/07/13 0040  GLUCAP 107* 120* 121*   Insulin Requirements in the past 24 hours:  Moderate SSI: 5units  Nutritional Goals:   RD recs 11/24: 60-70 g of protein, 1300-1500 kCal, 1.3-1.5 L fluid per day.  Clinimix E 5/15 at a goal rate of 55 ml/hr + 20% fat emulsion at 8 ml/hr to provide: 66 g/day protein, 1321 Kcal/day.  Current Nutrition:   Clinimix E 5/15 at 36ml/hr + 20% fat emulsion at 29ml/hr.  Diet: FLD 11/27- eating 50% of meals per MD note  IVF: NS with KCl 40 mEq/L at 41ml/hr.  Assessment: 70 yoF with long history of hiatal hernia s/p nissen fundoplication presents with  intractable vomiting and concern for UGIB. 11/18 EGD showed possible gastric volvulus with feculant material in esophagus.  11/19 CT abd/pelvis/chest findings did not suggest gastric volvulus, but did confirm large hiatal hernia with gastric outlet obstruction.  Pt was electively placed on mechanical ventilator on 11/18 following EGD to prevent aspiration and extubated 11/22. To OR 11/20 open repair of hiatal hernia, placement of J tube, TNA ordered per pharmacy.  11/28: No esophageal leak per UGI 11/25, clear liquids started 11/26.  Energy needs re-estimated (after extubation on 11/22) and J tube out of place on 11/23 causing TF to be stopped.    Glucose - controlled (150mg /dl)  Electrolytes - K 4.1, Phos is wnl (after KPhos 10 mmol on 11/24). Corr Ca 10.2  Renal: SCr stable, net I/O (24h) = -  LFTs - AST/ALT wnl, Alk Phos incr to 118  TGs - 110 (11/24)  Prealbumin - 9.6 (11/24)  TPN Access: PICC TPN started: 11/20  Plan:   Patient started on full liquids 11/26 patient states she ate 25-50% of her breakfast items (grits, yogurt, juice), currently eating soup, yogurt and juice, will decrease Clinimix E 5/15 to 10ml/hr   Change to  20% fat emulsion at 8 ml/hr daily  Wean to off as tolerates and diet advanced  TNA to contain standard multivitamins and trace elements.  Continue IVF of NS w/ 40 KCl at 60ml/hr per MD.  Cont Moderate SSI q4h.   TNA lab panels on Mondays & Thursdays.   F/u daily.  Juliette Alcide, PharmD, BCPS.   Pager: [phone] 08/07/2013 7:01 AM

## 2013-08-07 NOTE — Progress Notes (Signed)
OT Cancellation Note  Patient Details Name: [name] MRN: [id number] DOB: 1941-02-19   Cancelled Treatment:    Reason Eval/Treat Not Completed: Other (comment) Attempted OT eval earlier but per nursing, pt had just returned to bed and note pt asleep. Nursing requests to let pt rest right now. Will try back later today as schedule allows or next date.  Lennox Laity [phone] 08/07/2013, 12:46 PM

## 2013-08-08 ENCOUNTER — Encounter (HOSPITAL_COMMUNITY): Payer: Self-pay | Admitting: Cardiology

## 2013-08-08 ENCOUNTER — Inpatient Hospital Stay (HOSPITAL_COMMUNITY): Payer: Medicare Other

## 2013-08-08 DIAGNOSIS — R001 Bradycardia, unspecified: Secondary | ICD-10-CM

## 2013-08-08 DIAGNOSIS — I498 Other specified cardiac arrhythmias: Secondary | ICD-10-CM

## 2013-08-08 LAB — BASIC METABOLIC PANEL
BUN: 12 mg/dL (ref 6–23)
CO2: 26 mEq/L (ref 19–32)
Calcium: 8.3 mg/dL — ABNORMAL LOW (ref 8.4–10.5)
Chloride: 104 mEq/L (ref 96–112)
Glucose, Bld: 119 mg/dL — ABNORMAL HIGH (ref 70–99)
Potassium: 4.1 mEq/L (ref 3.5–5.1)
Sodium: 138 mEq/L (ref 135–145)

## 2013-08-08 LAB — GLUCOSE, CAPILLARY
Glucose-Capillary: 119 mg/dL — ABNORMAL HIGH (ref 70–99)
Glucose-Capillary: 115 mg/dL — ABNORMAL HIGH (ref 70–99)
Glucose-Capillary: 109 mg/dL — ABNORMAL HIGH (ref 70–99)
Glucose-Capillary: 90 mg/dL (ref 70–99)
Glucose-Capillary: 87 mg/dL (ref 70–99)

## 2013-08-08 LAB — URINALYSIS, ROUTINE W REFLEX MICROSCOPIC
Glucose, UA: NEGATIVE mg/dL
Ketones, ur: NEGATIVE mg/dL
Leukocytes, UA: NEGATIVE
Nitrite: NEGATIVE
Protein, ur: NEGATIVE mg/dL
pH: 6.5 (ref 5.0–8.0)

## 2013-08-08 LAB — CBC
Hemoglobin: 8.6 g/dL — ABNORMAL LOW (ref 12.0–15.0)
MCH: 29 pg (ref 26.0–34.0)
Platelets: 388 10*3/uL (ref 150–400)
RBC: 2.97 MIL/uL — ABNORMAL LOW (ref 3.87–5.11)
WBC: 10.3 10*3/uL (ref 4.0–10.5)

## 2013-08-08 MED ORDER — TRACE MINERALS CR-CU-F-FE-I-MN-MO-SE-ZN IV SOLN
INTRAVENOUS | Status: DC
  Administered 2013-08-08: 18:00:00 via INTRAVENOUS
  Filled 2013-08-08: qty 1000

## 2013-08-08 MED ORDER — FAT EMULSION 20 % IV EMUL
250.0000 mL | INTRAVENOUS | Status: DC
  Administered 2013-08-08: 18:00:00 250 mL via INTRAVENOUS
  Filled 2013-08-08: qty 250

## 2013-08-08 NOTE — Progress Notes (Signed)
Received call from CCMD concerning heart rate Huston Foley down-33) and pause (2.97). Paged and talked with on-call MD with [facility]. MD verbalized that he will talk with the internal physicians and will most likely get cardiology to come see her today. Pt's vitals are stable post-episode. Pt resting comfortably. Will continue to monitor. Fraser Din, RN

## 2013-08-08 NOTE — Progress Notes (Signed)
Pt G-tube residual 12cc and flushed with air as ordered. Tolerating PO diet. Will continue to monitor. Fraser Din, RN

## 2013-08-08 NOTE — Progress Notes (Signed)
9 Days Post-Op  Subjective: Feeling okay, bradycardia this morning but asymptomatic  Objective: Vital signs in last 24 hours: Temp:  [98.1 F (36.7 C)-98.6 F (37 C)] 98.6 F (37 C) (11/29 0440) Pulse Rate:  [102-125] 125 (11/29 0800) Resp:  [20-24] 22 (11/29 0800) BP: (119-128)/(66-81) 128/81 mmHg (11/29 0800) SpO2:  [92 %-97 %] 95 % (11/29 0440) Weight:  [107 lb 2.3 oz (48.6 kg)] 107 lb 2.3 oz (48.6 kg) (11/29 0440) Last BM Date: 08/07/13  Intake/Output from previous day: 11/28 0701 - 11/29 0700 In: 1786 [P.O.:420; I.V.:600; TPN:576] Out: 3055 [Urine:2900; Drains:154; Stool:1] Intake/Output this shift: Total I/O In: 240 [P.O.:240] Out: 720 [Urine:700; Drains:20]  General appearance: alert, cooperative and no distress Resp: nonlabored Cardio: normal rate, regualr GI: soft, NT, ND, wound with some slight erythema at the base, gtube flushed, JP ss Extremities: SCD's bilat  Lab Results:   Recent Labs  08/07/13 1200 08/08/13 0440  WBC 10.9* 10.3  HGB 9.0* 8.6*  HCT 27.2* 26.2*  PLT 388 388   BMET  Recent Labs  08/07/13 0550 08/08/13 0440  NA 139 138  K 4.1 4.1  CL 106 104  CO2 27 26  GLUCOSE 119* 119*  BUN 15 12  CREATININE 0.46* 0.43*  CALCIUM 8.1* 8.3*   PT/INR No results found for this basename: LABPROT, INR,  in the last 72 hours ABG No results found for this basename: PHART, PCO2, PO2, HCO3,  in the last 72 hours  Studies/Results: No results found.  Anti-infectives: Anti-infectives   Start     Dose/Rate Route Frequency Ordered Stop   07/30/13 1700  ceFAZolin (ANCEF) IVPB 1 g/50 mL premix  Status:  Discontinued    Comments:  In OR holding   1 g 100 mL/hr over 30 Minutes Intravenous 3 times per day 07/30/13 0722 08/03/13 1409   07/30/13 0900  ceFAZolin (ANCEF) IVPB 1 g/50 mL premix     1 g 100 mL/hr over 30 Minutes Intravenous  Once 07/30/13 1610 07/30/13 0935      Assessment/Plan: s/p Procedure(s): Exploratory Laparotomy, Lysis of  Adhesions, Reduction of Hiatal Hernia, Repair of Esophogeal Perforations, Dor Fundal Plication, Evacuation of Gastric Bezoar, Gastrostomy Tube Insertion, Upper Endoscopy by Dr. [name] (N/A) soft diet though not eating much.  plan to wean TPN and use gtube as needed.  monitor redness at lower aspect of incision.  IF any increased pain or fevers, this may need to be opened.  Will discuss bradycardia with IM/cardiology.  She will likely need SNF  LOS: 11 days    Lodema Pilot DAVID 08/08/2013

## 2013-08-08 NOTE — Progress Notes (Signed)
Patient ID: [name], female   DOB: Apr 01, 1941, 72 y.o.   MRN: [id number] [facility] PROGRESS NOTE  [name] WUJ:[id number] DOB: Jul 16, 1941 DOA: 07/28/2013 PCP: Hollice Espy, MD  Brief narrative:  29 F with large HH admitted 11/18 by [facility] with intractable vomiting and concern for UGIB. Underwent EGD 11/19 revealing possible gastric volvulus. Intubated after EGD to protect airway and permit completion of evaluation.   Assessment/Plan:  Recurrent recurrent large hiatal hernia/gastric outlet obstruction/esophageal perforation/gastric bezoar  - Status post laparotomy 07/30/2013. Patient passing gas, with loose stools.  - Patient on TPN per pharmacy. Continue PPI.  - management per primary team  Bradycardia/Tachycardia - appreciate cardiology input - will order CXR as well and continue to monitor on telemetry  Hypokalemia./Hypomagnesemia  - Likely secondary to GI losses.  - supplemented and within normal limits this AM  Acute respiratory failure  - Secondary to compromised airway. Resolved. Extubated 07/31/2013  - Continue pulmonary hygiene. Mobilize.  - Keep sats greater than 92%. CXR today given tachycardia and low grade fever  Acute encephalopathy  - Resolved.  Hypertension  - reasonable inpatient control on no antihypertensives  - continue to hold home BP medications  Glaucoma  - Continue eyedrops.  Prophylaxis  - Protonix for GI prophylaxis. SCDs for DVT prophylaxis.  Anemia of chronic disease  - Hg and Hct overall stable but slight drop in Hg over 24 hours - no signs of active bleed - CBC in AM Leukocytosis  - unclear etiology but also with mild tachycardia  - WBC is within normal limits this AM - CXR and UA pending   Code Status: Full  Family Communication: Discussed with patient no family at bedside.  Disposition Plan: Per primary team   Consultants:  PCCM  Gastroenterology: Dr. Christella Hartigan Procedures:  ETT 11/19 >>11/21  RUE PICC 11/19>>>  GJ  Tube11-20>>>  11/19 - EGD: stomach was filled with solid brown, feculent appearing material. The gastric anatomy was very abnormal, distorted and twisted  11/19 - CT chest/abd/pelvis:  11/20 - Exp lap (Rosenbower): Large hiatal hernia, gastric outlet obstruction, gastric volvulus, bezoar, esophageal perforation. PROCEDURE: lysis of adhesions, reduction of hiatal hernia, repair of esophageal perforation, Dor fundoplication, evacuation of gastric bezoar, placement of the gastrostomy-jejunostomy tube  11/22 - Extubated 11/21 Deconditioned. In pain : needing fent q2h but able to sit in chair.  Antibiotics:  Cefazolin 11/20 perioperative   HPI/Subjective: No events overnight.   Objective: Filed Vitals:   08/07/13 0545 08/07/13 1320 08/07/13 2029 08/08/13 0440  BP: 123/74 119/68 125/66 123/78  Pulse: 119 102 106 113  Temp: 97.6 F (36.4 C) 98.2 F (36.8 C) 98.1 F (36.7 C) 98.6 F (37 C)  TempSrc: Oral Oral Oral Oral  Resp: 20 20 24 24   Height:      Weight: 48.7 kg (107 lb 5.8 oz)   48.6 kg (107 lb 2.3 oz)  SpO2: 97% 97% 92% 95%    Intake/Output Summary (Last 24 hours) at 08/08/13 0650 Last data filed at 08/08/13 9562  Gross per 24 hour  Intake   1530 ml  Output   3055 ml  Net  -1525 ml    Exam:   General:  Pt is somnolent but easy to arouse, follows commands appropriately, not in acute distress  Cardiovascular: Regular rhythm, tachycardic, S1/S2, no murmurs, no rubs, no gallops  Respiratory: Clear to auscultation bilaterally, diminished breath sounds at bases   Abdomen: Soft, non tender, non distended, bowel sounds present, no guarding  Extremities: No edema, pulses DP and PT palpable bilaterally  Data Reviewed: Basic Metabolic Panel:  Recent Labs Lab 08/03/13 0620  08/04/13 0545 08/04/13 1823 08/05/13 0455 08/06/13 0425 08/07/13 0550 08/08/13 0440  NA 141  < > 139 138 139 136 139 138  K 3.2*  < > 3.3* 4.2 3.8 4.5 4.1 4.1  CL 108  < > 105 104 105 103 106 104   CO2 28  < > 29 27 27 28 27 26   GLUCOSE 82  < > 124* 117* 111* 113* 119* 119*  BUN 15  < > 11 11 12 14 15 12   CREATININE 0.44*  < > 0.42* 0.42* 0.42* 0.48* 0.46* 0.43*  CALCIUM 8.2*  < > 7.9* 8.2* 8.1* 8.5 8.1* 8.3*  MG 1.7  --  1.8  --  2.0 1.9  --   --   PHOS 2.2*  --  3.3  --  3.5 4.0  --   --   < > = values in this interval not displayed. Liver Function Tests:  Recent Labs Lab 08/03/13 0620 08/06/13 0425  AST 18 27  ALT 11 19  ALKPHOS 87 118*  BILITOT 0.3 0.3  PROT 5.0* 5.1*  ALBUMIN 1.8* 1.9*   CBC:  Recent Labs Lab 08/03/13 0620 08/04/13 0545 08/05/13 0455 08/06/13 0425 08/07/13 1200 08/08/13 0440  WBC 9.5 7.5 8.8 10.9* 10.9* 10.3  NEUTROABS 7.7 5.7 6.9 8.9*  --   --   HGB 8.8* 8.5* 8.7* 9.1* 9.0* 8.6*  HCT 26.3* 25.6* 26.1* 27.2* 27.2* 26.2*  MCV 87.1 86.8 87.0 86.9 88.0 88.2  PLT 203 231 280 345 388 388   CBG:  Recent Labs Lab 08/07/13 1153 08/07/13 1626 08/07/13 2022 08/07/13 2341 08/08/13 0436  GLUCAP 125* 116* 144* 107* 119*    Recent Results (from the past 240 hour(s))  CLOSTRIDIUM DIFFICILE BY PCR     Status: None   Collection Time    08/03/13  4:18 PM      Result Value Range Status   C difficile by pcr NEGATIVE  NEGATIVE Final   Comment: Performed at [facility]     Scheduled Meds: . antiseptic oral rinse  15 mL Mouth Rinse QID  . carboxymethylcellulose  1 drop Both Eyes TID WC & HS  . [employer] EYE LUBRICANT NIGHTTIME  1 drop Ophthalmic QHS  . ferrous sulfate  325 mg Oral TID WC  . heparin subcutaneous  5,000 Units Subcutaneous Q8H  . insulin aspart  0-15 Units Subcutaneous Q4H  . lip balm  1 application Topical BID  . lipase/protease/amylase  2 capsule Oral TID WC  . loperamide  2 mg Oral QHS  . pantoprazole  40 mg Oral Q1200  . polycarbophil  625 mg Oral BID  . saccharomyces boulardii  250 mg Oral BID  . sodium chloride  10-40 mL Intracatheter Q12H  . Tafluprost  1 drop Ophthalmic QHS  . timolol  1 drop Both Eyes BID    Continuous Infusions: . Marland KitchenTPN (CLINIMIX-E) Adult 40 mL/hr at 08/07/13 1751   And  . fat emulsion 250 mL (08/07/13 1752)  . sodium chloride 0.9 % 1,000 mL with potassium chloride 40 mEq infusion 50 mL/hr at 08/07/13 2040     Debbora Presto, MD  [facility] Pager [phone]  If 7PM-7AM, please contact night-coverage [email or web address] Password [email or web address] 08/08/2013, 6:50 AM   LOS: 11 days

## 2013-08-08 NOTE — Consult Note (Signed)
HPI: 72 year old female for evaluation of bradycardia. Echocardiogram in September of 2010 showed normal LV function, grade 1 diastolic dysfunction, trace aortic insufficiency, mild left atrial enlargement. Patient underwent exploratory laparotomy, lysis of adhesions, reduction of hiatal hernia, repair of esophageal perforation, fundoplication and placement of a gastrostomy jejunostomy tube on November 20. She has done well since then and denies dyspnea, chest pain, palpitations or syncope. She was noted to have bradycardia on telemetry at approximately 5 AM today. She had no symptoms. Note prior to admission she denies dyspnea on exertion, orthopnea, PND, pedal edema or exertional chest pain. No recent syncope.  Medications Prior to Admission  Medication Sig Dispense Refill  . alosetron (LOTRONEX) 1 MG tablet Take 1 mg by mouth daily with breakfast.      . amLODipine-benazepril (LOTREL) 5-20 MG per capsule Take 1 capsule by mouth daily after breakfast. Patient took at home prior to arrival to Short Stay      . Artificial Tear Ointment (REFRESH LACRI-LUBE) OINT Apply 1 drop to eye 4 (four) times daily.       Marland Kitchen aspirin 81 MG tablet Take 81 mg by mouth daily after breakfast.       . ibandronate (BONIVA) 150 MG tablet Take 150 mg by mouth every 3 (three) months. Take 1 tablet daily for one week then off one week      . loperamide (IMODIUM) 2 MG capsule Take 2 mg by mouth daily with breakfast.      . Multiple Vitamins-Minerals (CENTRUM ULTRA WOMENS) TABS Take 1 tablet by mouth daily.      Marland Kitchen omeprazole (PRILOSEC) 20 MG capsule Take 20 mg by mouth daily before breakfast.      . Pancrelipase, Lip-Prot-Amyl, (CREON) 24000 UNITS CPEP Take 3 capsules by mouth 3 (three) times daily with meals. Take 3 caps with each meal and 3 caps with each snack      . Tafluprost (ZIOPTAN) 0.0015 % SOLN Apply 1 drop to eye at bedtime.       . timolol (BETIMOL) 0.5 % ophthalmic solution Place 1 drop into both eyes 2 (two)  times daily.      . Vitamin D, Ergocalciferol, (DRISDOL) 50000 UNITS CAPS Take 50,000 Units by mouth every 14 (fourteen) days.       . Wheat Dextrin (BENEFIBER) TABS Take 2 tablets by mouth 2 (two) times daily.        Allergies  Allergen Reactions  . Codeine     REACTION: Dlizzy    Past Medical History  Diagnosis Date  . Glaucoma   . Dry eye syndrome   . GI bleed   . Diverticulosis   . GERD (gastroesophageal reflux disease)   . Thoracogenic scoliosis of thoracolumbar region     SEVERE  . Chronic pancreatitis   . IBS (irritable bowel syndrome)   . Pelvic floor dysfunction     and rectalprolaspe  . Osteoporosis   . Internal hemorrhoids   . Hypertension   . Dry mouth   . Blood transfusion     HEMORRHAGED  1 WEEK AFTER COLONOSCOPY  -REQUIRED TRANSFUSION  . Abscessed tooth     STATES TOOTH PULLED RIGHT LOWER MOLAR   AND LEFT LOWER MOLAR ON3/25/13 AND PT ON ANTIBIOTIC  . Hiatal hernia     LARGE HIATAL HERNIA PER CXR REPORT  . 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 Nissen fundoplication    . Vaginal hysterectomy  2002    A&P repair/bladder sling  . Cataract extraction      bilateral  . Band hemorrhoidectomy    . Mouth surgery    . Colonoscopy  02/03/2008    diverticulosis, internal hemorrhoids  . Upper gastrointestinal endoscopy  02/12/2008    hiatal henia  . Flexible sigmoidoscopy  11/11/2008    internal hemorrhoids  . Hiatal hernia repair    . Mouth surgery    . Eye surgery      LASER OF BOTH EYES  FOR ELEVATED PRESSURE  . Bladder suspension  2002    Grapey - w/ hysty  . Staple hemorrhoidectomy    . Esophagogastroduodenoscopy N/A 07/28/2013    Procedure: ESOPHAGOGASTRODUODENOSCOPY (EGD);  Surgeon: Rachael Fee, MD;  Location: [facility];  Service: Endoscopy;  Laterality: N/A;  . Esophagogastroduodenoscopy N/A  07/28/2013    Procedure: ESOPHAGOGASTRODUODENOSCOPY (EGD);  Surgeon: Rachael Fee, MD;  Location: [facility];  Service: Endoscopy;  Laterality: N/A;  . Hiatal hernia repair N/A 07/30/2013    Procedure: Exploratory Laparotomy, Lysis of Adhesions, Reduction of Hiatal Hernia, Repair of Esophogeal Perforations, Dor Fundal Plication, Evacuation of Gastric Bezoar, Gastrostomy Tube Insertion, Upper Endoscopy by Dr. [name];  Surgeon: Adolph Pollack, MD;  Location: [facility];  Service: General;  Laterality: N/A;    History   Social History  . Marital Status: Widowed    Spouse Name: N/A    Number of Children: N/A  . Years of Education: N/A   Occupational History  . former School bus driver    Social History Main Topics  . Smoking status: Former Smoker    Types: Cigarettes  . Smokeless tobacco: Never Used     Comment: ONLY SMOKED AS A TEEN  . Alcohol Use: No  . Drug Use: No  . Sexual Activity: No   Other Topics Concern  . Not on file   Social History Narrative  . No narrative on file    Family History  Problem Relation Age of Onset  . Heart disease Sister   . Liver cancer Sister   . Stroke Father   . Liver cancer Sister   . Brain cancer Sister   . Colon cancer Neg Hx   . Kidney disease Mother   . Lung cancer Brother   . Heart disease Brother     Atrial fibrillation    ROS:  Residual abdominal soreness but no fevers or chills, productive cough, hemoptysis, dysphasia, odynophagia, melena, hematochezia, dysuria, hematuria, rash, seizure activity, orthopnea, PND, pedal edema, claudication. Remaining systems are negative.  Physical Exam:   Blood pressure 128/81, pulse 125, temperature 98.6 F (37 C), temperature source Oral, resp. rate 22, height 4\' 9"  (1.448 m), weight 107 lb 2.3 oz (48.6 kg), SpO2 95.00%.  General:  Well developed/frail in NAD Skin warm/dry Patient not depressed No peripheral clubbing Back-normal HEENT-normal/normal eyelids Neck supple/normal carotid  upstroke bilaterally; no bruits; no JVD; no thyromegaly chest - Diminished BS bases CV - RRR/normal S1 and S2; no rubs or gallops;  PMI nondisplaced; 1/6 SEM Abdomen -status post abdominal surgery with mild erythema lower incision. Feeding tube in place. 2+ femoral pulses, no bruits Ext-no edema, chords, 2+ DP Neuro-grossly nonfocal  ECG sinus rhythm with no ST changes.  Results for orders placed during the hospital encounter of 07/28/13 (from the past 48 hour(s))  GLUCOSE, CAPILLARY     Status: Abnormal  Collection Time    08/06/13  4:58 PM      Result Value Range   Glucose-Capillary 107 (*) 70 - 99 mg/dL   Comment 1 Documented in Chart     Comment 2 Notify RN    GLUCOSE, CAPILLARY     Status: Abnormal   Collection Time    08/06/13  8:50 PM      Result Value Range   Glucose-Capillary 120 (*) 70 - 99 mg/dL   Comment 1 Documented in Chart     Comment 2 Notify RN    GLUCOSE, CAPILLARY     Status: Abnormal   Collection Time    08/07/13 12:40 AM      Result Value Range   Glucose-Capillary 121 (*) 70 - 99 mg/dL   Comment 1 Documented in Chart     Comment 2 Notify RN    GLUCOSE, CAPILLARY     Status: Abnormal   Collection Time    08/07/13  3:29 AM      Result Value Range   Glucose-Capillary 103 (*) 70 - 99 mg/dL   Comment 1 Documented in Chart     Comment 2 Notify RN    BASIC METABOLIC PANEL     Status: Abnormal   Collection Time    08/07/13  5:50 AM      Result Value Range   Sodium 139  135 - 145 mEq/L   Potassium 4.1  3.5 - 5.1 mEq/L   Chloride 106  96 - 112 mEq/L   CO2 27  19 - 32 mEq/L   Glucose, Bld 119 (*) 70 - 99 mg/dL   BUN 15  6 - 23 mg/dL   Creatinine, Ser 0.98 (*) 0.50 - 1.10 mg/dL   Calcium 8.1 (*) 8.4 - 10.5 mg/dL   GFR calc non Af Amer >90  >90 mL/min   GFR calc Af Amer >90  >90 mL/min   Comment: (NOTE)     The eGFR has been calculated using the CKD EPI equation.     This calculation has not been validated in all clinical situations.     eGFR's  persistently <90 mL/min signify possible Chronic Kidney     Disease.  GLUCOSE, CAPILLARY     Status: Abnormal   Collection Time    08/07/13  7:44 AM      Result Value Range   Glucose-Capillary 119 (*) 70 - 99 mg/dL   Comment 1 Notify RN     Comment 2 Documented in Chart    GLUCOSE, CAPILLARY     Status: Abnormal   Collection Time    08/07/13 11:53 AM      Result Value Range   Glucose-Capillary 125 (*) 70 - 99 mg/dL   Comment 1 Notify RN     Comment 2 Documented in Chart    CBC     Status: Abnormal   Collection Time    08/07/13 12:00 PM      Result Value Range   WBC 10.9 (*) 4.0 - 10.5 K/uL   RBC 3.09 (*) 3.87 - 5.11 MIL/uL   Hemoglobin 9.0 (*) 12.0 - 15.0 g/dL   HCT 11.9 (*) 14.7 - 82.9 %   MCV 88.0  78.0 - 100.0 fL   MCH 29.1  26.0 - 34.0 pg   MCHC 33.1  30.0 - 36.0 g/dL   RDW 56.2  13.0 - 86.5 %   Platelets 388  150 - 400 K/uL  GLUCOSE, CAPILLARY     Status:  Abnormal   Collection Time    08/07/13  4:26 PM      Result Value Range   Glucose-Capillary 116 (*) 70 - 99 mg/dL   Comment 1 Notify RN     Comment 2 Documented in Chart    GLUCOSE, CAPILLARY     Status: Abnormal   Collection Time    08/07/13  8:22 PM      Result Value Range   Glucose-Capillary 144 (*) 70 - 99 mg/dL  GLUCOSE, CAPILLARY     Status: Abnormal   Collection Time    08/07/13 11:41 PM      Result Value Range   Glucose-Capillary 107 (*) 70 - 99 mg/dL  GLUCOSE, CAPILLARY     Status: Abnormal   Collection Time    08/08/13  4:36 AM      Result Value Range   Glucose-Capillary 119 (*) 70 - 99 mg/dL  CBC     Status: Abnormal   Collection Time    08/08/13  4:40 AM      Result Value Range   WBC 10.3  4.0 - 10.5 K/uL   RBC 2.97 (*) 3.87 - 5.11 MIL/uL   Hemoglobin 8.6 (*) 12.0 - 15.0 g/dL   HCT 09.8 (*) 11.9 - 14.7 %   MCV 88.2  78.0 - 100.0 fL   MCH 29.0  26.0 - 34.0 pg   MCHC 32.8  30.0 - 36.0 g/dL   RDW 82.9  56.2 - 13.0 %   Platelets 388  150 - 400 K/uL  BASIC METABOLIC PANEL     Status:  Abnormal   Collection Time    08/08/13  4:40 AM      Result Value Range   Sodium 138  135 - 145 mEq/L   Potassium 4.1  3.5 - 5.1 mEq/L   Chloride 104  96 - 112 mEq/L   CO2 26  19 - 32 mEq/L   Glucose, Bld 119 (*) 70 - 99 mg/dL   BUN 12  6 - 23 mg/dL   Creatinine, Ser 8.65 (*) 0.50 - 1.10 mg/dL   Calcium 8.3 (*) 8.4 - 10.5 mg/dL   GFR calc non Af Amer >90  >90 mL/min   GFR calc Af Amer >90  >90 mL/min   Comment: (NOTE)     The eGFR has been calculated using the CKD EPI equation.     This calculation has not been validated in all clinical situations.     eGFR's persistently <90 mL/min signify possible Chronic Kidney     Disease.  GLUCOSE, CAPILLARY     Status: Abnormal   Collection Time    08/08/13  7:18 AM      Result Value Range   Glucose-Capillary 115 (*) 70 - 99 mg/dL  GLUCOSE, CAPILLARY     Status: Abnormal   Collection Time    08/08/13 12:13 PM      Result Value Range   Glucose-Capillary 109 (*) 70 - 99 mg/dL    No results found.  Assessment/Plan 1 bradycardia-patient had transient sinus bradycardia at approximately 5 AM while asleep. She apparently had no symptoms. There is no indication for further treatment at this point. Check electrocardiogram. Follow on telemetry while here. 2 status post abdominal surgery-management per general surgery.  Olga Millers MD 08/08/2013, 2:07 PM

## 2013-08-08 NOTE — Progress Notes (Signed)
Pt G-tube residual 10cc and flushed with air per order. Tolerating PO diet well. Will continue to monitor. Fraser Din, RN

## 2013-08-08 NOTE — Progress Notes (Addendum)
PARENTERAL NUTRITION CONSULT NOTE - follow up  Pharmacy Consult for TNA Indication: Hiatal hernia, unable to use enteral nutrition  Allergies  Allergen Reactions  . Codeine     REACTION: Dlizzy   Patient Measurements: Height: 4\' 9"  (144.8 cm) Weight: 107 lb 2.3 oz (48.6 kg) IBW/kg (Calculated) : 38.6 Usual Weight: 98lb (44.5kg)  Vital Signs:  Temp: 98.6 F (37 C) (11/29 0440) Temp src: Oral (11/29 0440) BP: 123/78 mmHg (11/29 0440) Pulse Rate: 113 (11/29 0440) Intake/Output from previous day: 11/28 0701 - 11/29 0700 In: 1786 [P.O.:420; I.V.:600; TPN:576] Out: 3055 [Urine:2900; Drains:154; Stool:1] Intake/Output from this shift: Total I/O In: -  Out: 400 [Urine:400]  Labs:  Recent Labs  08/06/13 0425 08/07/13 1200 08/08/13 0440  WBC 10.9* 10.9* 10.3  HGB 9.1* 9.0* 8.6*  HCT 27.2* 27.2* 26.2*  PLT 345 388 388    Recent Labs  08/06/13 0425 08/07/13 0550 08/08/13 0440  NA 136 139 138  K 4.5 4.1 4.1  CL 103 106 104  CO2 28 27 26   GLUCOSE 113* 119* 119*  BUN 14 15 12   CREATININE 0.48* 0.46* 0.43*  CALCIUM 8.5 8.1* 8.3*  MG 1.9  --   --   PHOS 4.0  --   --   PROT 5.1*  --   --   ALBUMIN 1.9*  --   --   AST 27  --   --   ALT 19  --   --   ALKPHOS 118*  --   --   BILITOT 0.3  --   --    Estimated Creatinine Clearance: 42.7 ml/min (by C-G formula based on Cr of 0.43).    Recent Labs  08/07/13 2341 08/08/13 0436 08/08/13 0718  GLUCAP 107* 119* 115*   Insulin Requirements in the past 24 hours:  Moderate SSI: 4 units  Nutritional Goals:   RD recs 11/24: 60-70 g of protein, 1300-1500 kCal, 1.3-1.5 L fluid per day.  Clinimix E 5/15 at a goal rate of 55 ml/hr + 20% fat emulsion at 8 ml/hr to provide: 66 g/day protein, 1321 Kcal/day.  Current Nutrition:   Clinimix E 5/15 at 31ml/hr + 20% fat emulsion at 90ml/hr.  Diet: FLD started 11/26 - eating 60% per RN charting, tolerating PO diet well    IVF: NS with KCl 40 mEq/L at  17ml/hr.  Assessment: 57 yoF with long history of hiatal hernia s/p nissen fundoplication presents with intractable vomiting and concern for UGIB. 11/18 EGD showed possible gastric volvulus with feculant material in esophagus.  11/19 CT abd/pelvis/chest findings did not suggest gastric volvulus, but did confirm large hiatal hernia with gastric outlet obstruction.  Pt was electively placed on mechanical ventilator on 11/18 following EGD to prevent aspiration and extubated 11/22. To OR 11/20 open repair of hiatal hernia, placement of J tube, TNA ordered per pharmacy.  11/28: No esophageal leak per UGI 11/25, clear liquids started 11/26.  Energy needs re-estimated (after extubation on 11/22) and J tube out of place on 11/23 causing TF to be stopped.  11/29: Diet advanced to dysphagia diet 3 - Surgery notes despite diet ordered patient is not eating much   Glucose - controlled (150mg /dl)  Electrolytes - K 4.1, Phos is wnl 11/28 (after KPhos 10 mmol on 11/24).   Renal: SCr stable  LFTs - AST/ALT wnl 11/27, Alk Phos incr to 118  TGs - 110 (11/24)  Prealbumin - 9.6 (11/20); decreased 6.2 (11/24)   TPN Access: PICC TPN started: 11/20  Plan:   Patient advanced to dysphagia diet today, Surgery notes patient "not eating much", Will continue the TNA but reduce to  Clinimix E 5/15 decreased to 66ml/hr - Hopeful if patient eats today/tomorrow TNA can be turned off, will f/u with Surgery  Change to 20% fat emulsion at 8 ml/hr daily  Wean to off as tolerates and diet advanced  TNA to contain standard multivitamins and trace elements.  Continue IVF of NS w/ 40 KCl at 32ml/hr per MD.   Cont Moderate SSI q4h.   TNA lab panels on Mondays & Thursdays.   F/u daily.  Jonessa Triplett, Loma Messing PharmD Pager #: [phone] 7:59 AM 08/08/2013

## 2013-08-09 LAB — GLUCOSE, CAPILLARY
Glucose-Capillary: 101 mg/dL — ABNORMAL HIGH (ref 70–99)
Glucose-Capillary: 111 mg/dL — ABNORMAL HIGH (ref 70–99)
Glucose-Capillary: 115 mg/dL — ABNORMAL HIGH (ref 70–99)
Glucose-Capillary: 117 mg/dL — ABNORMAL HIGH (ref 70–99)
Glucose-Capillary: 86 mg/dL (ref 70–99)

## 2013-08-09 LAB — CBC
Hemoglobin: 8.3 g/dL — ABNORMAL LOW (ref 12.0–15.0)
MCH: 29.1 pg (ref 26.0–34.0)
MCHC: 32.9 g/dL (ref 30.0–36.0)
MCV: 88.4 fL (ref 78.0–100.0)
Platelets: 396 10*3/uL (ref 150–400)
WBC: 10.1 10*3/uL (ref 4.0–10.5)

## 2013-08-09 LAB — BASIC METABOLIC PANEL
Calcium: 8.2 mg/dL — ABNORMAL LOW (ref 8.4–10.5)
GFR calc Af Amer: >90 mL/min (ref >90)
GFR calc non Af Amer: >90 mL/min (ref >90)
Glucose, Bld: 102 mg/dL — ABNORMAL HIGH (ref 70–99)
Potassium: 4.1 mEq/L (ref 3.5–5.1)
Sodium: 139 mEq/L (ref 135–145)

## 2013-08-09 MED ORDER — KCL IN DEXTROSE-NACL 20-5-0.45 MEQ/L-%-% IV SOLN
INTRAVENOUS | Status: DC
  Administered 2013-08-09 – 2013-08-13 (×2): via INTRAVENOUS
  Filled 2013-08-09 (×4): qty 1000

## 2013-08-09 MED ORDER — ENSURE COMPLETE PO LIQD
237.0000 mL | Freq: Two times a day (BID) | ORAL | Status: DC
  Administered 2013-08-09 – 2013-08-14 (×4): 237 mL via ORAL

## 2013-08-09 MED ORDER — JEVITY 1.2 CAL PO LIQD
1000.0000 mL | ORAL | Status: DC
  Administered 2013-08-09 – 2013-08-13 (×4): 1000 mL
  Filled 2013-08-09 (×7): qty 1000

## 2013-08-09 MED ORDER — TIMOLOL MALEATE 0.5 % OP SOLN
1.0000 [drp] | OPHTHALMIC | Status: DC
  Administered 2013-08-10 – 2013-08-15 (×9): 1 [drp] via OPHTHALMIC

## 2013-08-09 NOTE — Progress Notes (Signed)
Patient ID: [name], female   DOB: [birth year], 72 y.o.   MRN: [id number]  General Surgery - [facility] - Progress Note  POD# 10  Subjective: Patient eating better this AM.  No nausea.  Ambulated.  Wants to discuss Rehab versus discharge to her daughter's home.  Objective: Vital signs in last 24 hours: Temp:  [97.9 F (36.6 C)-98.3 F (36.8 C)] 97.9 F (36.6 C) (11/30 0526) Pulse Rate:  [100-103] 100 (11/30 0526) Resp:  [20] 20 (11/30 0526) BP: (112-133)/(66-80) 127/72 mmHg (11/30 0526) SpO2:  [97 %-100 %] 100 % (11/30 0526) Weight:  [99 lb 14.4 oz (45.314 kg)] 99 lb 14.4 oz (45.314 kg) (11/30 0500) Last BM Date: 08/08/13  Intake/Output from previous day: 11/29 0701 - 11/30 0700 In: 922 [P.O.:480; I.V.:200; TPN:152] Out: 2945 [Urine:2850; Drains:95]  Exam: HEENT - clear, not icteric Neck - soft Chest - clear bilaterally Cor - RRR, no murmur Abd - soft without distension; erythema (mild) mid and lower incision - no drainage, no fluctuence; drain with serous fluid; G-tube site clear Ext - no significant edema Neuro - grossly intact, no focal deficits  Lab Results:   Recent Labs  08/08/13 0440 08/09/13 0540  WBC 10.3 10.1  HGB 8.6* 8.3*  HCT 26.2* 25.2*  PLT 388 396     Recent Labs  08/08/13 0440 08/09/13 0540  NA 138 139  K 4.1 4.1  CL 104 105  CO2 26 26  GLUCOSE 119* 102*  BUN 12 13  CREATININE 0.43* 0.49*  CALCIUM 8.3* 8.2*    Studies/Results: Dg Chest 2 View  08/08/2013   CLINICAL DATA:  Shortness of breath.  Fever.  EXAM: CHEST  2 VIEW  COMPARISON:  08/04/2013.  FINDINGS: PICC line noted in stable anatomic position. Continued partial clearing of bilateral lung fields suggesting clearing pulmonary edema and/or underlying effusions. Persistent moderate size pleural effusions remain. Cardiomegaly. Normal pulmonary vascularity. Surgical clips and catheters in the upper abdomen.  IMPRESSION: 1. Continued clearing of the lung fields  bilaterally suggesting clearing of pulmonary edema and/or underlying effusions. 2. Persistent moderate-sized pleural effusions. 3. PICC line in stable position.   Electronically Signed   By: Maisie Fus  Register   On: 08/08/2013 15:45    Assessment / Plan: 1.  Gastric volvulus, status post repair with G-tube placement  Will discontinue TNA today  Encourage po intake  Nutrition to evaluate for supplements, G-tube feeds if needed  Monitor wound for infection  Case manager to see - ? Rehab consult, options  Velora Heckler, MD, [facility] Office: [phone]  08/09/2013

## 2013-08-09 NOTE — Progress Notes (Addendum)
   CARE MANAGEMENT NOTE 08/09/2013  Patient:  [name], [name]   Account Number:  [id number]  Date Initiated:  07/29/2013  Documentation initiated by:  DAVIS,RHONDA  Subjective/Objective Assessment:   pt with active emesis and intractable, intubation perform for airway protection     Action/Plan:   lives alone does have children in the area   Anticipated DC Date:  08/08/2013   Anticipated DC Plan:  HOME/SELF CARE  In-house referral  Clinical Social Worker      DC Planning Services  CM consult      Choice offered to / List presented to:             Status of service:  Completed, signed off Medicare Important Message given?  NA - LOS <3 / Initial given by admissions (If response is "NO", the following Medicare IM given date fields will be blank) Date Medicare IM given:   Date Additional Medicare IM given:    Discharge Disposition:  SKILLED NURSING FACILITY  Per UR Regulation:  Reviewed for med. necessity/level of care/duration of stay  If discussed at Long Length of Stay Meetings, dates discussed:   08/04/2013    Comments:  08/09/2013 10:54 AM  NCM spoke to pt and states she wants to go to SNF-rehab for a short period of time to get stronger. She lives at home alone. States her son-in-law can probably go tomorrow and look at facilities once she receives offers. But she really likes [facility]. CSW referral made for SNF placement. [name] RN CCM Case Mgmt phone [phone]   08/07/13 KATHY MAHABIR RN,BSN NCM [phone] POD#8,EXP LAP,LOA,REDUCTION OF HERNIA,G-J TUBE,JP-SEROUS 125CC,P-119,FULL LIQ.D/C PLAN SNF.  08/03/13 KATHY MAHABIR RN,BSN NCM [phone] TRANSFER FROM SDU,EXTUBATED,POD#4 EXP LAP,LOA,REDUCTION OF HERNIA,G-J TUBE,JP-SEROSANG,KRUNS,IV PROTONIX,RESP FAILURE.D/C PLAN HOME.  [id number]/LOVFIE [name], RN,BSN,CCM: Case management [phone] Chart reviewed and updated.  Next chart review due on [id number]. Needs for discharge at time of review:  None

## 2013-08-09 NOTE — H&P (Deleted)
Scheduled Meds: . antiseptic oral rinse  15 mL Mouth Rinse QID  . carboxymethylcellulose  1 drop Both Eyes TID WC & HS  . CVS EYE LUBRICANT NIGHTTIME  1 drop Ophthalmic QHS  . ferrous sulfate  325 mg Oral TID WC  . heparin subcutaneous  5,000 Units Subcutaneous Q8H  . insulin aspart  0-15 Units Subcutaneous Q4H  . lip balm  1 application Topical BID  . lipase/protease/amylase  2 capsule Oral TID WC  . loperamide  2 mg Oral QHS  . pantoprazole  40 mg Oral Q1200  . polycarbophil  625 mg Oral BID  . saccharomyces boulardii  250 mg Oral BID  . sodium chloride  10-40 mL Intracatheter Q12H  . Tafluprost  1 drop Ophthalmic QHS  . timolol  1 drop Both Eyes BID   Continuous Infusions: . dextrose 5 % and 0.45 % NaCl with KCl 20 mEq/L 50 mL/hr at 08/09/13 1123  . sodium chloride 0.9 % 1,000 mL with potassium chloride 40 mEq infusion 50 mL/hr at 08/08/13 1715   PRN Meds:.acetaminophen, acetaminophen, alum & mag hydroxide-simeth, fentaNYL, loperamide, magic mouthwash, ondansetron (ZOFRAN) IV, ondansetron, oxyCODONE, sodium chloride

## 2013-08-09 NOTE — Progress Notes (Signed)
    Subjective:  Denies CP or dyspnea; mild nausea this AM   Objective:  Filed Vitals:   08/08/13 1432 08/08/13 2010 08/09/13 0500 08/09/13 0526  BP: 112/66 133/80  127/72  Pulse: 103 102  100  Temp: 98.1 F (36.7 C) 98.3 F (36.8 C)  97.9 F (36.6 C)  TempSrc: Oral Oral  Oral  Resp: 20 20  20   Height:      Weight:   99 lb 14.4 oz (45.314 kg)   SpO2: 99% 97%  100%    Intake/Output from previous day:  Intake/Output Summary (Last 24 hours) at 08/09/13 0729 Last data filed at 08/09/13 1610  Gross per 24 hour  Intake    922 ml  Output   2745 ml  Net  -1823 ml    Physical Exam: Physical exam: Well-developed frail in no acute distress.  Skin is warm and dry.  HEENT is normal.  Neck is supple.  Chest diminished BS bases Cardiovascular exam is regular rate and rhythm.  Abdominal exam s/p abdominal surgery Extremities show no edema. neuro grossly intact    Lab Results: Basic Metabolic Panel:  Recent Labs  96/04/54 0440 08/09/13 0540  NA 138 139  K 4.1 4.1  CL 104 105  CO2 26 26  GLUCOSE 119* 102*  BUN 12 13  CREATININE 0.43* 0.49*  CALCIUM 8.3* 8.2*   CBC:  Recent Labs  08/08/13 0440 08/09/13 0540  WBC 10.3 10.1  HGB 8.6* 8.3*  HCT 26.2* 25.2*  MCV 88.2 88.4  PLT 388 396     Assessment/Plan:  1 bradycardia-telemetry reviewed; she continues to have transient sinus bradycardia during early AM hours but no bradycardia at other times and no symptoms. There is no indication for further treatment at this point. Check electrocardiogram. Follow on telemetry while here.  2 status post abdominal surgery-management per general surgery.   Olga Millers 08/09/2013, 7:29 AM

## 2013-08-09 NOTE — Progress Notes (Signed)
Patient ID: [name], female   DOB: 1941/07/29, 72 y.o.   MRN: [id number] [facility] PROGRESS NOTE  SHAWNIECE [name] YQM:[id number] DOB: March 17, 1941 DOA: 07/28/2013 PCP: Hollice Espy, MD  Brief narrative:  42 F with large HH admitted 11/18 by [facility] with intractable vomiting and concern for UGIB. Underwent EGD 11/19 revealing possible gastric volvulus. Intubated after EGD to protect airway and permit completion of evaluation.   Assessment/Plan:  Recurrent recurrent large hiatal hernia/gastric outlet obstruction/esophageal perforation/gastric bezoar  - Status post laparotomy 07/30/2013. Patient passing gas, with loose stools.  - Patient on TPN per pharmacy. Continue PPI.  - management per primary team  Bradycardia/Tachycardia  - appreciate cardiology input  - CXR indicative of resolving pulmonary edema but persistent moderate size pleural effusion noted again  - no further work up indicated per cardio rec's Hypokalemia./Hypomagnesemia  - Likely secondary to GI losses.  - supplemented and within normal limits this AM  Acute respiratory failure  - Secondary to compromised airway. Resolved. Extubated 07/31/2013  - Continue pulmonary hygiene. Mobilize.  - Keep sats greater than 92%.  Acute encephalopathy  - Resolved.  Hypertension  - reasonable inpatient control on no antihypertensives  - continue to hold home BP medications  Glaucoma  - Continue eyedrops.  Prophylaxis  - Protonix for GI prophylaxis. SCDs for DVT prophylaxis.  Anemia of chronic disease  - Hg and Hct overall stable but slight drop in Hg over 24 hours  - no signs of active bleed  - CBC in AM  Leukocytosis  - unclear etiology but also with mild tachycardia  - WBC is within normal limits this AM and pt remains afebrile over 48 hours - CXR unremarkable for acute cardiopulmonary etiology  Code Status: Full  Family Communication: Discussed with patient no family at bedside.  Disposition Plan: Per primary team    Consultants:  PCCM  Gastroenterology: Dr. Christella Hartigan Procedures:  ETT 11/19 >>11/21  RUE PICC 11/19>>>  GJ Tube11-20>>>  11/19 - EGD: stomach was filled with solid brown, feculent appearing material. The gastric anatomy was very abnormal, distorted and twisted  11/19 - CT chest/abd/pelvis:  11/20 - Exp lap (Rosenbower): Large hiatal hernia, gastric outlet obstruction, gastric volvulus, bezoar, esophageal perforation. PROCEDURE: lysis of adhesions, reduction of hiatal hernia, repair of esophageal perforation, Dor fundoplication, evacuation of gastric bezoar, placement of the gastrostomy-jejunostomy tube  11/22 - Extubated 11/21 Deconditioned. In pain : needing fent q2h but able to sit in chair.  11/29 CXR Continued clearing of the lung fields bilaterally suggesting clearing of pulmonary edema and/or underlying effusions. Persistent moderate-sized pleural effusions. Antibiotics:  Cefazolin 11/20 perioperative   HPI/Subjective: No events overnight.   Objective: Filed Vitals:   08/08/13 1432 08/08/13 2010 08/09/13 0500 08/09/13 0526  BP: 112/66 133/80  127/72  Pulse: 103 102  100  Temp: 98.1 F (36.7 C) 98.3 F (36.8 C)  97.9 F (36.6 C)  TempSrc: Oral Oral  Oral  Resp: 20 20  20   Height:      Weight:   45.314 kg (99 lb 14.4 oz)   SpO2: 99% 97%  100%    Intake/Output Summary (Last 24 hours) at 08/09/13 0800 Last data filed at 08/09/13 0734  Gross per 24 hour  Intake    922 ml  Output   2725 ml  Net  -1803 ml    Exam:   General:  Pt is alert, follows commands appropriately, not in acute distress  Cardiovascular: Regular rhythm, tachycardia, S1/S2, no murmurs, no rubs,  no gallops  Respiratory: Clear to auscultation bilaterally, no wheezing, no crackles, no rhonchi  Abdomen: Soft, non tender, non distended, bowel sounds present, no guarding  Extremities: No edema, pulses DP and PT palpable bilaterally  Neuro: Grossly nonfocal  Data Reviewed: Basic Metabolic  Panel:  Recent Labs Lab 08/03/13 0620  08/04/13 0545  08/05/13 0455 08/06/13 0425 08/07/13 0550 08/08/13 0440 08/09/13 0540  NA 141  < > 139  < > 139 136 139 138 139  K 3.2*  < > 3.3*  < > 3.8 4.5 4.1 4.1 4.1  CL 108  < > 105  < > 105 103 106 104 105  CO2 28  < > 29  < > 27 28 27 26 26   GLUCOSE 82  < > 124*  < > 111* 113* 119* 119* 102*  BUN 15  < > 11  < > 12 14 15 12 13   CREATININE 0.44*  < > 0.42*  < > 0.42* 0.48* 0.46* 0.43* 0.49*  CALCIUM 8.2*  < > 7.9*  < > 8.1* 8.5 8.1* 8.3* 8.2*  MG 1.7  --  1.8  --  2.0 1.9  --   --   --   PHOS 2.2*  --  3.3  --  3.5 4.0  --   --   --   < > = values in this interval not displayed. Liver Function Tests:  Recent Labs Lab 08/03/13 0620 08/06/13 0425  AST 18 27  ALT 11 19  ALKPHOS 87 118*  BILITOT 0.3 0.3  PROT 5.0* 5.1*  ALBUMIN 1.8* 1.9*   CBC:  Recent Labs Lab 08/03/13 0620 08/04/13 0545 08/05/13 0455 08/06/13 0425 08/07/13 1200 08/08/13 0440 08/09/13 0540  WBC 9.5 7.5 8.8 10.9* 10.9* 10.3 10.1  NEUTROABS 7.7 5.7 6.9 8.9*  --   --   --   HGB 8.8* 8.5* 8.7* 9.1* 9.0* 8.6* 8.3*  HCT 26.3* 25.6* 26.1* 27.2* 27.2* 26.2* 25.2*  MCV 87.1 86.8 87.0 86.9 88.0 88.2 88.4  PLT 203 231 280 345 388 388 396   CBG:  Recent Labs Lab 08/08/13 1213 08/08/13 1655 08/08/13 2012 08/08/13 2352 08/09/13 0410  GLUCAP 109* 90 87 117* 111*    Recent Results (from the past 240 hour(s))  CLOSTRIDIUM DIFFICILE BY PCR     Status: None   Collection Time    08/03/13  4:18 PM      Result Value Range Status   C difficile by pcr NEGATIVE  NEGATIVE Final   Comment: Performed at [facility]     Scheduled Meds: . antiseptic oral rinse  15 mL Mouth Rinse QID  . carboxymethylcellulose  1 drop Both Eyes TID WC & HS  . CVS EYE LUBRICANT NIGHTTIME  1 drop Ophthalmic QHS  . ferrous sulfate  325 mg Oral TID WC  . heparin subcutaneous  5,000 Units Subcutaneous Q8H  . insulin aspart  0-15 Units Subcutaneous Q4H  . lip balm  1  application Topical BID  . lipase/protease/amylase  2 capsule Oral TID WC  . loperamide  2 mg Oral QHS  . pantoprazole  40 mg Oral Q1200  . polycarbophil  625 mg Oral BID  . saccharomyces boulardii  250 mg Oral BID  . sodium chloride  10-40 mL Intracatheter Q12H  . Tafluprost  1 drop Ophthalmic QHS  . timolol  1 drop Both Eyes BID   Continuous Infusions: . Marland KitchenTPN (CLINIMIX-E) Adult 30 mL/hr at 08/08/13 1804   And  .  fat emulsion 250 mL (08/08/13 1805)  . sodium chloride 0.9 % 1,000 mL with potassium chloride 40 mEq infusion 50 mL/hr at 08/08/13 1715    Debbora Presto, MD  [facility] Pager [phone]  If 7PM-7AM, please contact night-coverage [email or web address] Password [email or web address] 08/09/2013, 8:00 AM   LOS: 12 days

## 2013-08-09 NOTE — Progress Notes (Signed)
Met with Pt to discuss d/c plans.  Pt stated that after talking with MD and her daughter, it's in her best interest to go to SNF prior to going to her daughter's home in [place].  CSW spoke with daughter via phone in Pt's room.  Daughter confirmed the d/c plan and stated that she will be in surgery at [facility] on Tuesday and recuperating the rest of the week.  Her husband will visit Pt tomorrow and can tour facilities, should [facility] or [facility] not offer Pt a bed.  Pt was privy to CSW's conversation and was in agreement.  Pt gave CSW permission to begin the SNF search.  Weekday CSW to f/u with offers.  Providence Crosby, LCSWA Clinical Social Work [phone]

## 2013-08-09 NOTE — Progress Notes (Signed)
NUTRITION FOLLOW UP  Intervention:   - Ensure Complete po BID, each supplement provides 350 kcal and 13 grams of protein.  - Initiate Jevity 1.2 @ 20 ml/hr via g tube and increase by 10 ml every 4 hours to goal rate of 25 ml/hr.  At goal rate, tube feeding regimen will provide 720 kcal, 33 grams of protein, and 486 ml of H2O.   Goal rate will meet 50-60% of estimated calorie needs and 47-55% of estimated protein needs.  RD will continue to monitor  Nutrition Dx:   Inadequate oral intake related to inability to eat/mechanical ventilation as evidenced by NPO; complete  Inadequate oral intake related to hiatal hernia as evidenced by reported intake less than estimated needs; ongoing  Goal:   Pt to meet >/= 90% of their estimated nutrition needs; not met but improving  Monitor:   Wt, po intake, acceptance of supplements  Assessment:   72 year old female dmitted from home with c/o nausea, vomiting, and coffee ground emesis that stated Saturday PTA. Hx of large hiatal hernia, GI bleeding, diverticulosis, GERD, IBS, HTN, and nissen fundoplication. On Creon with meals, vitamin D, and Benefiber at home. Found to have upper GI bleed. EGD showed the stomach was filled with solid brown, feculent appearing material; gastric anatomy was very abnormal, distorted and twisted. Pt underwent exploratory laparotomy, lysis of adhesions (2 hours), reduction of hiatal hernia, repair of esophageal perforation, Dor fundoplication, evacuation of gastric bezoar, placement of the gastrostomy-jejunostomy tube 11/20.   Pt was extubated 11/22 Tube feeds were stopped 11/13- J-tube was out of place TPN started 11/20 and d/c'd 11/30 Pt is eating 25-50%, but will need supplemental tube feedings. Pt will also receive nutritional supplements.    Height: Ht Readings from Last 1 Encounters:  08/03/13 4\' 9"  (1.448 m)    Weight Status:   Wt Readings from Last 1 Encounters:  08/09/13 99 lb 14.4 oz (45.314 kg)     Re-estimated needs:  Kcal: 1250-1450 Protein: 60-70 g Fluid: 1250-1450 L/day  Skin: incision on abdomen, incision on rectum  Diet Order: Dysphagia   Intake/Output Summary (Last 24 hours) at 08/09/13 1210 Last data filed at 08/09/13 1138  Gross per 24 hour  Intake    922 ml  Output   2625 ml  Net  -1703 ml    Last BM: 11/29   Labs:   Recent Labs Lab 08/04/13 0545  08/05/13 0455 08/06/13 0425 08/07/13 0550 08/08/13 0440 08/09/13 0540  NA 139  < > 139 136 139 138 139  K 3.3*  < > 3.8 4.5 4.1 4.1 4.1  CL 105  < > 105 103 106 104 105  CO2 29  < > 27 28 27 26 26   BUN 11  < > 12 14 15 12 13   CREATININE 0.42*  < > 0.42* 0.48* 0.46* 0.43* 0.49*  CALCIUM 7.9*  < > 8.1* 8.5 8.1* 8.3* 8.2*  MG 1.8  --  2.0 1.9  --   --   --   PHOS 3.3  --  3.5 4.0  --   --   --   GLUCOSE 124*  < > 111* 113* 119* 119* 102*  < > = values in this interval not displayed.  CBG (last 3)   Recent Labs  08/08/13 2352 08/09/13 0410 08/09/13 0800  GLUCAP 117* 111* 101*    Scheduled Meds: . antiseptic oral rinse  15 mL Mouth Rinse QID  . carboxymethylcellulose  1 drop Both Eyes  TID WC & HS  . CVS EYE LUBRICANT NIGHTTIME  1 drop Ophthalmic QHS  . ferrous sulfate  325 mg Oral TID WC  . heparin subcutaneous  5,000 Units Subcutaneous Q8H  . insulin aspart  0-15 Units Subcutaneous Q4H  . lip balm  1 application Topical BID  . lipase/protease/amylase  2 capsule Oral TID WC  . loperamide  2 mg Oral QHS  . pantoprazole  40 mg Oral Q1200  . polycarbophil  625 mg Oral BID  . saccharomyces boulardii  250 mg Oral BID  . sodium chloride  10-40 mL Intracatheter Q12H  . Tafluprost  1 drop Ophthalmic QHS  . timolol  1 drop Both Eyes BID    Continuous Infusions: . dextrose 5 % and 0.45 % NaCl with KCl 20 mEq/L 50 mL/hr at 08/09/13 1123  . sodium chloride 0.9 % 1,000 mL with potassium chloride 40 mEq infusion 50 mL/hr at 08/08/13 1715    Ebbie Latus RD, LDN

## 2013-08-09 NOTE — Progress Notes (Signed)
Clinical Social Work Department CLINICAL SOCIAL WORK PLACEMENT NOTE 08/09/2013  Patient:  [name], [name]  Account Number:  [id number] Admit date:  07/28/2013  Clinical Social Worker:  Doroteo Glassman  Date/time:  08/09/2013 12:11 PM  Clinical Social Work is seeking post-discharge placement for this patient at the following level of care:   SKILLED NURSING   (*CSW will update this form in Epic as items are completed)   08/09/2013  Patient/family provided with [facility] Department of Clinical Social Work's list of facilities offering this level of care within the geographic area requested by the patient (or if unable, by the patient's family).  08/09/2013  Patient/family informed of their freedom to choose among providers that offer the needed level of care, that participate in Medicare, Medicaid or managed care program needed by the patient, have an available bed and are willing to accept the patient.  08/09/2013  Patient/family informed of [facility]' ownership interest in [name], as well as of the fact that they are under no obligation to receive care at this facility.  PASARR submitted to EDS on 08/09/2013 PASARR number received from EDS on 08/09/2013  FL2 transmitted to all facilities in geographic area requested by pt/family on  08/09/2013 FL2 transmitted to all facilities within larger geographic area on   Patient informed that his/her managed care company has contracts with or will negotiate with  certain facilities, including the following:     Patient/family informed of bed offers received:   Patient chooses bed at  Physician recommends and patient chooses bed at    Patient to be transferred to  on   Patient to be transferred to facility by   The following physician request were entered in Epic:   Additional Comments:  Providence Crosby, Theresia Majors Clinical Social Work [phone]

## 2013-08-10 LAB — GLUCOSE, CAPILLARY
Glucose-Capillary: 122 mg/dL — ABNORMAL HIGH (ref 70–99)
Glucose-Capillary: 112 mg/dL — ABNORMAL HIGH (ref 70–99)

## 2013-08-10 LAB — PREALBUMIN: Prealbumin: 11.1 mg/dL — ABNORMAL LOW (ref 17.0–34.0)

## 2013-08-10 MED ORDER — CEFAZOLIN SODIUM 1-5 GM-% IV SOLN
1.0000 g | Freq: Three times a day (TID) | INTRAVENOUS | Status: DC
  Administered 2013-08-10 – 2013-08-12 (×6): 1 g via INTRAVENOUS
  Filled 2013-08-10 (×10): qty 50

## 2013-08-10 NOTE — Progress Notes (Signed)
   TELEMETRY: Reviewed telemetry pt in NSR rate 90-100: Filed Vitals:   08/09/13 1427 08/09/13 2013 08/10/13 0433 08/10/13 0500  BP: 115/70 118/66 109/68   Pulse: 100 104 97   Temp: 98.2 F (36.8 C) 98 F (36.7 C) 98 F (36.7 C)   TempSrc: Oral Oral Oral   Resp: 20 24 22    Height:      Weight:    100 lb 12.8 oz (45.723 kg)  SpO2: 100% 97% 94%     Intake/Output Summary (Last 24 hours) at 08/10/13 0730 Last data filed at 08/10/13 0334  Gross per 24 hour  Intake 1271.66 ml  Output   1366 ml  Net -94.34 ml    SUBJECTIVE No chest pain or dyspnea.  LABS: Basic Metabolic Panel:  Recent Labs  40/98/11 0440 08/09/13 0540  NA 138 139  K 4.1 4.1  CL 104 105  CO2 26 26  GLUCOSE 119* 102*  BUN 12 13  CREATININE 0.43* 0.49*  CALCIUM 8.3* 8.2*   CBC:  Recent Labs  08/08/13 0440 08/09/13 0540  WBC 10.3 10.1  HGB 8.6* 8.3*  HCT 26.2* 25.2*  MCV 88.2 88.4  PLT 388 396    Radiology/Studies:  Dg Chest 2 View  08/08/2013   CLINICAL DATA:  Shortness of breath.  Fever.  EXAM: CHEST  2 VIEW  COMPARISON:  08/04/2013.  FINDINGS: PICC line noted in stable anatomic position. Continued partial clearing of bilateral lung fields suggesting clearing pulmonary edema and/or underlying effusions. Persistent moderate size pleural effusions remain. Cardiomegaly. Normal pulmonary vascularity. Surgical clips and catheters in the upper abdomen.  IMPRESSION: 1. Continued clearing of the lung fields bilaterally suggesting clearing of pulmonary edema and/or underlying effusions. 2. Persistent moderate-sized pleural effusions. 3. PICC line in stable position.   Electronically Signed   By: Maisie Fus  Register   On: 08/08/2013 15:45      PHYSICAL EXAM General: elderly and frail. NAD Head: Normal Neck: Negative for carotid bruits. JVD not elevated. Lungs: Clear bilaterally to auscultation without wheezes, rales, or rhonchi. Breathing is unlabored. Heart: RRR S1 S2 without murmurs, rubs, or  gallops.  Abdomen: Soft, non-tender, s/p surgery Extremities: No clubbing, cyanosis or edema.  Distal pedal pulses are 2+ and equal bilaterally. Neuro: Alert and oriented X 3. Grossly intact. Psych:  Responds to questions appropriately with a normal affect.  ASSESSMENT AND PLAN: 1. Bradycardia- resolved. No further bradycardia or block. On no rate slowing meds. Nothing further to add from a cardiac standpoint. We will sign off. Please call with further problems.  Principal Problem:   Gastric outlet obstruction Active Problems:   Irritable bowel syndrome   GI bleed   Persistent recurrent vomiting   Nausea with vomiting   Acute respiratory failure with hypoxia   Protein calorie malnutrition   Esophageal perforation   Volvulus   Gastric bezoar   HH (hiatus hernia): RECURRENT   Hypokalemia   Hypomagnesemia   Bradycardia    Signed, Peter [place] MD,FACC 08/10/2013 7:36 AM

## 2013-08-10 NOTE — Progress Notes (Signed)
Physical Therapy Treatment Patient Details Name: [name] MRN: [id number] DOB: November 06, 1940 Today's Date: 08/10/2013 Time: 4098-1191 PT Time Calculation (min): 15 min  PT Assessment / Plan / Recommendation  History of Present Illness 89 F with large HH adm 11/18 by [facility] with intractable vomiting and concern for UGIB. Underwent EGD 11/19 revealing possible gastric volvulus. Intubated after EGD to protect airway and permit completion of eval.11-20 Exp lap (Rosenbower): Large hiatal hernia, gastric outlet obstruction, gastric volvulus, bezoar, esophageal perforation. PROCEDURE: lysis of adhesions, reduction of hiatal hernia, repair of esophageal perforation, Dor fundoplication, evacuation of gastric bezoar, placement of the gastrostomy-jejunostomy tube   PT Comments   *Pt progressing well with mobility. Ready to DC to SNF from PT standpoint. **  Follow Up Recommendations  SNF     Does the patient have the potential to tolerate intense rehabilitation     Barriers to Discharge        Equipment Recommendations  None recommended by PT    Recommendations for Other Services    Frequency Min 3X/week   Progress towards PT Goals Progress towards PT goals: Goals met and updated - see care plan  Plan Current plan remains appropriate    Precautions / Restrictions Precautions Precautions: Fall;Other (comment) Precaution Comments: abd incision Restrictions Weight Bearing Restrictions: No   Pertinent Vitals/Pain *pt denied pain**    Mobility  Bed Mobility Sit to Supine: 6: Modified independent (Device/Increase time);With rail Transfers Transfers: Sit to Stand;Stand to Sit Sit to Stand: 5: Supervision;From chair/3-in-1 Stand to Sit: 5: Supervision;To bed Details for Transfer Assistance: VCs hand placement Ambulation/Gait Ambulation/Gait Assistance: 5: Supervision Ambulation Distance (Feet): 320 Feet Assistive device: Rolling walker Gait Pattern: Within Functional Limits;Trunk  flexed Gait velocity: WFL General Gait Details: trunk flexed, VCs to negotiate obstacles, steady/no LOB    Exercises     PT Diagnosis:    PT Problem List:   PT Treatment Interventions:     PT Goals (current goals can now be found in the care plan section) Acute Rehab PT Goals Patient Stated Goal: To regain independence; be able to do work around the house PT Goal Formulation: With patient Time For Goal Achievement: 08/15/13 Potential to Achieve Goals: Good  Visit Information  Last PT Received On: 08/10/13 Assistance Needed: +1 History of Present Illness: 40 F with large HH adm 11/18 by [facility] with intractable vomiting and concern for UGIB. Underwent EGD 11/19 revealing possible gastric volvulus. Intubated after EGD to protect airway and permit completion of eval.11-20 Exp lap (Rosenbower): Large hiatal hernia, gastric outlet obstruction, gastric volvulus, bezoar, esophageal perforation. PROCEDURE: lysis of adhesions, reduction of hiatal hernia, repair of esophageal perforation, Dor fundoplication, evacuation of gastric bezoar, placement of the gastrostomy-jejunostomy tube    Subjective Data  Patient Stated Goal: To regain independence; be able to do work around the house   Cognition  Cognition Arousal/Alertness: Awake/alert Behavior During Therapy: WFL for tasks assessed/performed Overall Cognitive Status: Within Functional Limits for tasks assessed    Balance     End of Session PT - End of Session Equipment Utilized During Treatment: Gait belt Activity Tolerance: Patient tolerated treatment well Patient left: in bed;with call bell/phone within reach;with family/visitor present Nurse Communication: Mobility status   GP     Ralene Bathe Kistler 08/10/2013, 9:32 AM [phone]

## 2013-08-10 NOTE — Progress Notes (Signed)
Occupational Therapy Treatment Patient Details Name: [name] MRN: [id number] DOB: [birth year] Today's Date: 08/10/2013 Time: 0865-7846 OT Time Calculation (min): 13 min  OT Assessment / Plan / Recommendation  History of present illness 40 F with large HH adm 11/18 by [facility] with intractable vomiting and concern for UGIB. Underwent EGD 11/19 revealing possible gastric volvulus. Intubated after EGD to protect airway and permit completion of eval.11-20 Exp lap (Rosenbower): Large hiatal hernia, gastric outlet obstruction, gastric volvulus, bezoar, esophageal perforation. PROCEDURE: lysis of adhesions, reduction of hiatal hernia, repair of esophageal perforation, Dor fundoplication, evacuation of gastric bezoar, placement of the gastrostomy-jejunostomy tube   OT comments  Lengthy discussion regarding ADL activity at SNF and activities therapist would work on  Follow Up Recommendations  SNF;Supervision/Assistance - 24 hour       Equipment Recommendations  None recommended by OT       Frequency Min 2X/week   Progress towards OT Goals Progress towards OT goals: Progressing toward goals  Plan Discharge plan remains appropriate    Precautions / Restrictions Precautions Precautions: Fall;Other (comment) Precaution Comments: abd incision Restrictions Weight Bearing Restrictions: No       ADL  Grooming: Set up;Other (comment) (eye care) Where Assessed - Grooming: Supported sitting ADL Comments: Pt delighted to know she is able to go to [facility].  Discussed role of therapy at SNF. Pt very motivated to return to PLOF.  Discussed clothes for therapy and ADL activity- such as loose waist for comfort over abdomional wound      OT Goals(current goals can now be found in the care plan section) Acute Rehab OT Goals Patient Stated Goal: To regain independence; be able to do work around the house  Visit Information  Last OT Received On: 08/10/13 Assistance Needed: +1 History of Present  Illness: 46 F with large HH adm 11/18 by [facility] with intractable vomiting and concern for UGIB. Underwent EGD 11/19 revealing possible gastric volvulus. Intubated after EGD to protect airway and permit completion of eval.11-20 Exp lap (Rosenbower): Large hiatal hernia, gastric outlet obstruction, gastric volvulus, bezoar, esophageal perforation. PROCEDURE: lysis of adhesions, reduction of hiatal hernia, repair of esophageal perforation, Dor fundoplication, evacuation of gastric bezoar, placement of the gastrostomy-jejunostomy tube          Cognition  Cognition Arousal/Alertness: Awake/alert Behavior During Therapy: WFL for tasks assessed/performed Overall Cognitive Status: Within Functional Limits for tasks assessed    Mobility  Bed Mobility Sit to Supine: 6: Modified independent (Device/Increase time);With rail Transfers Sit to Stand: 5: Supervision;From chair/3-in-1 Stand to Sit: 5: Supervision;To bed Details for Transfer Assistance: VCs hand placement          End of Session OT - End of Session Equipment Utilized During Treatment: Rolling walker Activity Tolerance: Patient tolerated treatment well Patient left: in bed;with call bell/phone within reach  GO     [name], [place] Kung 08/10/2013, 3:11 PM

## 2013-08-10 NOTE — Progress Notes (Addendum)
Patient ID: [name], female   DOB: [birth year], 73 y.o.   MRN: [id number] [facility] PROGRESS NOTE  [name] WUJ:[id number] DOB: 02-Dec-1940 DOA: 07/28/2013 PCP: Hollice Espy, MD  Brief narrative:  65 F with large HH admitted 11/18 by [facility] with intractable vomiting and concern for UGIB. Underwent EGD 11/19 revealing possible gastric volvulus. Intubated after EGD to protect airway and permit completion of evaluation.   Assessment/Plan:  Recurrent recurrent large hiatal hernia/gastric outlet obstruction/esophageal perforation/gastric bezoar  - Status post laparotomy 07/30/2013. Patient passing gas, with loose stools.  - Patient on TPN per pharmacy. Continue PPI.  - management per primary team  Cellulitis around surgical site, around staple - ABX per primary tema  Bradycardia/Tachycardia  - appreciate cardiology input  - CXR indicative of resolving pulmonary edema but persistent moderate size pleural effusion noted again  - no further work up indicated per cardio rec's, they will sign off 12/01  Hypokalemia./Hypomagnesemia  - Likely secondary to GI losses.  - supplemented and within normal limits this AM  Acute respiratory failure  - Secondary to compromised airway. Resolved. Extubated 07/31/2013  - Continue pulmonary hygiene. Mobilize.  - Keep sats greater than 92%.  Acute encephalopathy  - Resolved.  Hypertension  - reasonable inpatient control on no antihypertensives  - continue to hold home BP medications  Glaucoma  - Continue eyedrops.  Prophylaxis  - Protonix for GI prophylaxis. SCDs for DVT prophylaxis.  Anemia of chronic disease  - Hg and Hct overall stable  - no signs of active bleed  - CBC in AM  Leukocytosis  - unclear etiology but also with mild tachycardia  - WBC is within normal limits 11/30 and pt remains afebrile over 72 hours  - CXR unremarkable for acute cardiopulmonary etiology   Code Status: Full  Family Communication: Discussed with patient  no family at bedside.  Disposition Plan: Per primary team   Consultants:  PCCM  Gastroenterology: Dr. Christella Hartigan Procedures:  ETT 11/19 >>11/21  RUE PICC 11/19>>>  GJ Tube11-20>>>  11/19 - EGD: stomach was filled with solid brown, feculent appearing material. The gastric anatomy was very abnormal, distorted and twisted  11/19 - CT chest/abd/pelvis:  11/20 - Exp lap (Rosenbower): Large hiatal hernia, gastric outlet obstruction, gastric volvulus, bezoar, esophageal perforation. PROCEDURE: lysis of adhesions, reduction of hiatal hernia, repair of esophageal perforation, Dor fundoplication, evacuation of gastric bezoar, placement of the gastrostomy-jejunostomy tube  11/22 - Extubated 11/21 Deconditioned. In pain : needing fent q2h but able to sit in chair.  11/29 CXR Continued clearing of the lung fields bilaterally suggesting clearing of pulmonary edema and/or underlying effusions. Persistent moderate-sized pleural effusions. Antibiotics:  Cefazolin 11/20 perioperative   HPI/Subjective: No events overnight.   Objective: Filed Vitals:   08/09/13 1427 08/09/13 2013 08/10/13 0433 08/10/13 0500  BP: 115/70 118/66 109/68   Pulse: 100 104 97   Temp: 98.2 F (36.8 C) 98 F (36.7 C) 98 F (36.7 C)   TempSrc: Oral Oral Oral   Resp: 20 24 22    Height:      Weight:    45.723 kg (100 lb 12.8 oz)  SpO2: 100% 97% 94%     Intake/Output Summary (Last 24 hours) at 08/10/13 1213 Last data filed at 08/10/13 1100  Gross per 24 hour  Intake 1121.66 ml  Output    799 ml  Net 322.66 ml    Exam:   General:  Pt is alert, follows commands appropriately, not in acute distress  Cardiovascular:  Regular rate and rhythm, S1/S2, no murmurs, no rubs, no gallops  Respiratory: Clear to auscultation bilaterally, no wheezing, no crackles, no rhonchi  Abdomen: Soft, non tender, non distended, erythema and warmth to touch around staples at the surgical site  Extremities: No edema, pulses DP and PT palpable  bilaterally  Neuro: Grossly nonfocal  Data Reviewed: Basic Metabolic Panel:  Recent Labs Lab 08/04/13 0545  08/05/13 0455 08/06/13 0425 08/07/13 0550 08/08/13 0440 08/09/13 0540  NA 139  < > 139 136 139 138 139  K 3.3*  < > 3.8 4.5 4.1 4.1 4.1  CL 105  < > 105 103 106 104 105  CO2 29  < > 27 28 27 26 26   GLUCOSE 124*  < > 111* 113* 119* 119* 102*  BUN 11  < > 12 14 15 12 13   CREATININE 0.42*  < > 0.42* 0.48* 0.46* 0.43* 0.49*  CALCIUM 7.9*  < > 8.1* 8.5 8.1* 8.3* 8.2*  MG 1.8  --  2.0 1.9  --   --   --   PHOS 3.3  --  3.5 4.0  --   --   --   < > = values in this interval not displayed. Liver Function Tests:  Recent Labs Lab 08/06/13 0425  AST 27  ALT 19  ALKPHOS 118*  BILITOT 0.3  PROT 5.1*  ALBUMIN 1.9*   CBC:  Recent Labs Lab 08/04/13 0545 08/05/13 0455 08/06/13 0425 08/07/13 1200 08/08/13 0440 08/09/13 0540  WBC 7.5 8.8 10.9* 10.9* 10.3 10.1  NEUTROABS 5.7 6.9 8.9*  --   --   --   HGB 8.5* 8.7* 9.1* 9.0* 8.6* 8.3*  HCT 25.6* 26.1* 27.2* 27.2* 26.2* 25.2*  MCV 86.8 87.0 86.9 88.0 88.2 88.4  PLT 231 280 345 388 388 396   CBG:  Recent Labs Lab 08/09/13 1647 08/09/13 2011 08/10/13 0004 08/10/13 0428 08/10/13 0737  GLUCAP 115* 127* 98 122* 112*    Recent Results (from the past 240 hour(s))  CLOSTRIDIUM DIFFICILE BY PCR     Status: None   Collection Time    08/03/13  4:18 PM      Result Value Range Status   C difficile by pcr NEGATIVE  NEGATIVE Final   Comment: Performed at [facility]     Scheduled Meds: . carboxymethylcellulose  1 drop Both Eyes TID WC & HS  . [employer] EYE LUBRICANT NIGHTTIME  1 drop Ophthalmic QHS  . feeding supplement (ENSURE COMPLETE)  237 mL Oral BID BM  . feeding supplement (JEVITY 1.2 CAL)  1,000 mL Per Tube Q24H  . ferrous sulfate  325 mg Oral TID WC  . heparin subcutaneous  5,000 Units Subcutaneous Q8H  . insulin aspart  0-15 Units Subcutaneous Q4H  . lipase/protease/amylase  2 capsule Oral TID WC  .  loperamide  2 mg Oral QHS  . pantoprazole  40 mg Oral Q1200  . polycarbophil  625 mg Oral BID  . saccharomyces boulardii  250 mg Oral BID  . Tafluprost  1 drop Ophthalmic QHS  . timolol  1 drop Both Eyes Custom   Continuous Infusions: . dextrose 5 % and 0.45 % NaCl with KCl 20 mEq/L 50 mL/hr at 08/09/13 1123  . sodium chloride 0.9 % 1,000 mL with potassium chloride 40 mEq infusion 50 mL/hr at 08/08/13 1715   Debbora Presto, MD  [facility] Pager [phone]  If 7PM-7AM, please contact night-coverage [email or web address] Password [email or web address] 08/10/2013, 12:13 PM   LOS: 13  days

## 2013-08-10 NOTE — Progress Notes (Signed)
Patient ID: [name], female   DOB: 1941-05-12, 72 y.o.   MRN: [id number] [facility] Surgery Progress Note:   11 Days Post-Op  Subjective: Mental status is clear.  Sitting up on chair and asking questions about the duration of tube feedings Objective: Vital signs in last 24 hours: Temp:  [98 F (36.7 C)-98.2 F (36.8 C)] 98 F (36.7 C) (12/01 0433) Pulse Rate:  [97-104] 97 (12/01 0433) Resp:  [20-24] 22 (12/01 0433) BP: (109-118)/(66-70) 109/68 mmHg (12/01 0433) SpO2:  [94 %-100 %] 94 % (12/01 0433) Weight:  [100 lb 12.8 oz (45.723 kg)] 100 lb 12.8 oz (45.723 kg) (12/01 0500)  Intake/Output from previous day: 11/30 0701 - 12/01 0700 In: 1271.7 [P.O.:800; I.V.:380.8; NG/GT:40.8] Out: 1366 [Urine:1300; Drains:66] Intake/Output this shift: Total I/O In: 120 [P.O.:120] Out: 50 [Drains:50]  Physical Exam: Work of breathing is not labored.  Feeding per G tube into the duodenum.    Lab Results:  Results for orders placed during the hospital encounter of 07/28/13 (from the past 48 hour(s))  GLUCOSE, CAPILLARY     Status: Abnormal   Collection Time    08/08/13 12:13 PM      Result Value Range   Glucose-Capillary 109 (*) 70 - 99 mg/dL  URINALYSIS, ROUTINE W REFLEX MICROSCOPIC     Status: None   Collection Time    08/08/13  3:48 PM      Result Value Range   Color, Urine YELLOW  YELLOW   APPearance CLEAR  CLEAR   Specific Gravity, Urine 1.011  1.005 - 1.030   pH 6.5  5.0 - 8.0   Glucose, UA NEGATIVE  NEGATIVE mg/dL   Hgb urine dipstick NEGATIVE  NEGATIVE   Bilirubin Urine NEGATIVE  NEGATIVE   Ketones, ur NEGATIVE  NEGATIVE mg/dL   Protein, ur NEGATIVE  NEGATIVE mg/dL   Urobilinogen, UA 0.2  0.0 - 1.0 mg/dL   Nitrite NEGATIVE  NEGATIVE   Leukocytes, UA NEGATIVE  NEGATIVE   Comment: MICROSCOPIC NOT DONE ON URINES WITH NEGATIVE PROTEIN, BLOOD, LEUKOCYTES, NITRITE, OR GLUCOSE <1000 mg/dL.  GLUCOSE, CAPILLARY     Status: None   Collection Time    08/08/13  4:55 PM   Result Value Range   Glucose-Capillary 90  70 - 99 mg/dL  GLUCOSE, CAPILLARY     Status: None   Collection Time    08/08/13  8:12 PM      Result Value Range   Glucose-Capillary 87  70 - 99 mg/dL  GLUCOSE, CAPILLARY     Status: Abnormal   Collection Time    08/08/13 11:52 PM      Result Value Range   Glucose-Capillary 117 (*) 70 - 99 mg/dL   Comment 1 Documented in Chart     Comment 2 Notify RN    GLUCOSE, CAPILLARY     Status: Abnormal   Collection Time    08/09/13  4:10 AM      Result Value Range   Glucose-Capillary 111 (*) 70 - 99 mg/dL   Comment 1 Hypo Protocol     Comment 2 Documented in Chart     Comment 3 Notify RN    CBC     Status: Abnormal   Collection Time    08/09/13  5:40 AM      Result Value Range   WBC 10.1  4.0 - 10.5 K/uL   RBC 2.85 (*) 3.87 - 5.11 MIL/uL   Hemoglobin 8.3 (*) 12.0 - 15.0 g/dL  HCT 25.2 (*) 36.0 - 46.0 %   MCV 88.4  78.0 - 100.0 fL   MCH 29.1  26.0 - 34.0 pg   MCHC 32.9  30.0 - 36.0 g/dL   RDW 16.1  09.6 - 04.5 %   Platelets 396  150 - 400 K/uL  BASIC METABOLIC PANEL     Status: Abnormal   Collection Time    08/09/13  5:40 AM      Result Value Range   Sodium 139  135 - 145 mEq/L   Potassium 4.1  3.5 - 5.1 mEq/L   Chloride 105  96 - 112 mEq/L   CO2 26  19 - 32 mEq/L   Glucose, Bld 102 (*) 70 - 99 mg/dL   BUN 13  6 - 23 mg/dL   Creatinine, Ser 4.09 (*) 0.50 - 1.10 mg/dL   Calcium 8.2 (*) 8.4 - 10.5 mg/dL   GFR calc non Af Amer >90  >90 mL/min   GFR calc Af Amer >90  >90 mL/min   Comment: (NOTE)     The eGFR has been calculated using the CKD EPI equation.     This calculation has not been validated in all clinical situations.     eGFR's persistently <90 mL/min signify possible Chronic Kidney     Disease.  GLUCOSE, CAPILLARY     Status: Abnormal   Collection Time    08/09/13  8:00 AM      Result Value Range   Glucose-Capillary 101 (*) 70 - 99 mg/dL  GLUCOSE, CAPILLARY     Status: None   Collection Time    08/09/13 12:04 PM       Result Value Range   Glucose-Capillary 86  70 - 99 mg/dL  GLUCOSE, CAPILLARY     Status: Abnormal   Collection Time    08/09/13  4:47 PM      Result Value Range   Glucose-Capillary 115 (*) 70 - 99 mg/dL  GLUCOSE, CAPILLARY     Status: Abnormal   Collection Time    08/09/13  8:11 PM      Result Value Range   Glucose-Capillary 127 (*) 70 - 99 mg/dL  GLUCOSE, CAPILLARY     Status: None   Collection Time    08/10/13 12:04 AM      Result Value Range   Glucose-Capillary 98  70 - 99 mg/dL  GLUCOSE, CAPILLARY     Status: Abnormal   Collection Time    08/10/13  4:28 AM      Result Value Range   Glucose-Capillary 122 (*) 70 - 99 mg/dL   Comment 1 Notify RN     Comment 2 Documented in Chart    GLUCOSE, CAPILLARY     Status: Abnormal   Collection Time    08/10/13  7:37 AM      Result Value Range   Glucose-Capillary 112 (*) 70 - 99 mg/dL    Radiology/Results: Dg Chest 2 View  08/08/2013   CLINICAL DATA:  Shortness of breath.  Fever.  EXAM: CHEST  2 VIEW  COMPARISON:  08/04/2013.  FINDINGS: PICC line noted in stable anatomic position. Continued partial clearing of bilateral lung fields suggesting clearing pulmonary edema and/or underlying effusions. Persistent moderate size pleural effusions remain. Cardiomegaly. Normal pulmonary vascularity. Surgical clips and catheters in the upper abdomen.  IMPRESSION: 1. Continued clearing of the lung fields bilaterally suggesting clearing of pulmonary edema and/or underlying effusions. 2. Persistent moderate-sized pleural effusions. 3. PICC line in stable position.  Electronically Signed   By: Maisie Fus  Register   On: 08/08/2013 15:45    Anti-infectives: Anti-infectives   Start     Dose/Rate Route Frequency Ordered Stop   07/30/13 1700  ceFAZolin (ANCEF) IVPB 1 g/50 mL premix  Status:  Discontinued    Comments:  In OR holding   1 g 100 mL/hr over 30 Minutes Intravenous 3 times per day 07/30/13 0722 08/03/13 1409   07/30/13 0900  ceFAZolin (ANCEF)  IVPB 1 g/50 mL premix     1 g 100 mL/hr over 30 Minutes Intravenous  Once 07/30/13 1478 07/30/13 0935      Assessment/Plan: Problem List: Patient Active Problem List   Diagnosis Date Noted  . Bradycardia 08/08/2013  . Esophageal perforation 08/04/2013  . Volvulus 08/04/2013  . Gastric bezoar 08/04/2013  . HH (hiatus hernia): RECURRENT 08/04/2013  . Hypokalemia 08/04/2013  . Hypomagnesemia 08/04/2013  . Protein calorie malnutrition 07/29/2013  . GI bleed 07/28/2013  . Hematemesis 07/28/2013  . Tachycardia 07/28/2013  . Persistent recurrent vomiting 07/28/2013  . Nausea with vomiting 07/28/2013  . Gastric outlet obstruction 07/28/2013  . Acute respiratory failure with hypoxia 07/28/2013  . Hemorrhoids grade 3 prolapsing 10/02/2011  . Chronic pancreatitis 02/17/2009  . Irritable bowel syndrome 02/17/2009  . OSTEOPOROSIS 06/08/2008  . GLAUCOMA 02/25/2008  . GERD 02/25/2008  . THORACOLUMBAR SCOLIOSIS, MILD 02/25/2008  . Diarrhea 02/25/2008    Slowing improving.  Continue nutritional supplement.   11 Days Post-Op    LOS: 13 days   Matt B. Daphine Deutscher, MD, [facility] [phone] beeper [phone]  08/10/2013 12:11 PM

## 2013-08-10 NOTE — Progress Notes (Signed)
Nutrition Brief Note  Received consult for calorie count. Discussed with pt and nursing. Visited pt as she had just gotten her lunch tray. Needed to use the bathroom so she had not yet eaten any lunch. Pt requested RN provide Ensure Complete without ice - notified RN. RD will continue to monitor and analyze calorie count results.   Levon Hedger MS, RD, LDN [phone] Pager [phone] After Hours Pager

## 2013-08-10 NOTE — Progress Notes (Signed)
CSW met with patient and son in law at bedside. Discussed bed offers. They are thrilled that [facility] has a bed as that is the snf that they are familiar with.  Aly Hauser C. Arti Trang MSW, LCSW [phone]

## 2013-08-10 NOTE — Progress Notes (Signed)
11 Days Post-Op  Subjective: She says she's eating all she can and that her abdomen is painful.  Objective: Vital signs in last 24 hours: Temp:  [98 F (36.7 C)-98.2 F (36.8 C)] 98 F (36.7 C) (12/01 0433) Pulse Rate:  [97-104] 97 (12/01 0433) Resp:  [20-24] 22 (12/01 0433) BP: (109-118)/(66-70) 109/68 mmHg (12/01 0433) SpO2:  [94 %-100 %] 94 % (12/01 0433) Weight:  [45.723 kg (100 lb 12.8 oz)] 45.723 kg (100 lb 12.8 oz) (12/01 0500) Last BM Date: 08/08/13 800 PO yesterday recorded. BM yesterday and today. Dysphagia III diet Afebrile, VSS Labs OK yesterday Intake/Output from previous day: 11/30 0701 - 12/01 0700 In: 1271.7 [P.O.:800; I.V.:380.8; NG/GT:40.8] Out: 1366 [Urine:1300; Drains:66] Intake/Output this shift: Total I/O In: 120 [P.O.:120] Out: -   General appearance: alert, cooperative, no distress and complains of abdominal pain. Resp: clear to auscultation bilaterally and anterior GI: soft abdomen, she is very tender with some midline cellulitis along lower portion of abdominal wound, and mid portion.  I removed 2 lower staples and one of the midportion wound staples.  I got some fluid from the lower site, but it did not open and I used culture tip to try and seperate wound.    Left side of picture is the base and I removed the 2 lower staples and one of those above that.   Lab Results:   Recent Labs  08/08/13 0440 08/09/13 0540  WBC 10.3 10.1  HGB 8.6* 8.3*  HCT 26.2* 25.2*  PLT 388 396    BMET  Recent Labs  08/08/13 0440 08/09/13 0540  NA 138 139  K 4.1 4.1  CL 104 105  CO2 26 26  GLUCOSE 119* 102*  BUN 12 13  CREATININE 0.43* 0.49*  CALCIUM 8.3* 8.2*   PT/INR No results found for this basename: LABPROT, INR,  in the last 72 hours   Recent Labs Lab 08/06/13 0425  AST 27  ALT 19  ALKPHOS 118*  BILITOT 0.3  PROT 5.1*  ALBUMIN 1.9*     Lipase     Component Value Date/Time   LIPASE 16 07/28/2013 0900     Studies/Results: Dg  Chest 2 View  08/08/2013   CLINICAL DATA:  Shortness of breath.  Fever.  EXAM: CHEST  2 VIEW  COMPARISON:  08/04/2013.  FINDINGS: PICC line noted in stable anatomic position. Continued partial clearing of bilateral lung fields suggesting clearing pulmonary edema and/or underlying effusions. Persistent moderate size pleural effusions remain. Cardiomegaly. Normal pulmonary vascularity. Surgical clips and catheters in the upper abdomen.  IMPRESSION: 1. Continued clearing of the lung fields bilaterally suggesting clearing of pulmonary edema and/or underlying effusions. 2. Persistent moderate-sized pleural effusions. 3. PICC line in stable position.   Electronically Signed   By: Maisie Fus  Register   On: 08/08/2013 15:45    Medications: . antiseptic oral rinse  15 mL Mouth Rinse QID  . carboxymethylcellulose  1 drop Both Eyes TID WC & HS  . [employer] EYE LUBRICANT NIGHTTIME  1 drop Ophthalmic QHS  . feeding supplement (ENSURE COMPLETE)  237 mL Oral BID BM  . feeding supplement (JEVITY 1.2 CAL)  1,000 mL Per Tube Q24H  . ferrous sulfate  325 mg Oral TID WC  . heparin subcutaneous  5,000 Units Subcutaneous Q8H  . insulin aspart  0-15 Units Subcutaneous Q4H  . lip balm  1 application Topical BID  . lipase/protease/amylase  2 capsule Oral TID WC  . loperamide  2 mg Oral QHS  .  pantoprazole  40 mg Oral Q1200  . polycarbophil  625 mg Oral BID  . saccharomyces boulardii  250 mg Oral BID  . sodium chloride  10-40 mL Intracatheter Q12H  . Tafluprost  1 drop Ophthalmic QHS  . 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  Malnutrition  Plan:  Calorie count so we can see what she is taking PO, check prealbumin The last on e was 6.2 on 08/02/13. Continue tube feedings for now.    Add ancef for now and await culture, local wound care to abdominal wound.  Continue PT/OT. She will need SNF for discharge.  I have cultured the drainage from staple/cellulitis site mid abdomen.     LOS: 13 days    Hajar Penninger 08/10/2013

## 2013-08-11 LAB — CBC
HCT: 26.2 % — ABNORMAL LOW (ref 36.0–46.0)
Hemoglobin: 8.6 g/dL — ABNORMAL LOW (ref 12.0–15.0)
MCH: 29.2 pg (ref 26.0–34.0)
MCV: 88.8 fL (ref 78.0–100.0)
RBC: 2.95 MIL/uL — ABNORMAL LOW (ref 3.87–5.11)
RDW: 13.8 % (ref 11.5–15.5)

## 2013-08-11 LAB — BASIC METABOLIC PANEL
BUN: 13 mg/dL (ref 6–23)
CO2: 29 mEq/L (ref 19–32)
Calcium: 8.4 mg/dL (ref 8.4–10.5)
GFR calc Af Amer: >90 mL/min (ref >90)
GFR calc non Af Amer: >90 mL/min (ref >90)
Glucose, Bld: 113 mg/dL — ABNORMAL HIGH (ref 70–99)
Sodium: 139 mEq/L (ref 135–145)

## 2013-08-11 LAB — GLUCOSE, CAPILLARY
Glucose-Capillary: 109 mg/dL — ABNORMAL HIGH (ref 70–99)
Glucose-Capillary: 125 mg/dL — ABNORMAL HIGH (ref 70–99)
Glucose-Capillary: 126 mg/dL — ABNORMAL HIGH (ref 70–99)
Glucose-Capillary: 91 mg/dL (ref 70–99)
Glucose-Capillary: 93 mg/dL (ref 70–99)
Glucose-Capillary: 98 mg/dL (ref 70–99)
Glucose-Capillary: 99 mg/dL (ref 70–99)

## 2013-08-11 MED ORDER — ALTEPLASE 2 MG IJ SOLR
2.0000 mg | Freq: Once | INTRAMUSCULAR | Status: AC
  Administered 2013-08-11: 2 mg
  Filled 2013-08-11: qty 2

## 2013-08-11 NOTE — Progress Notes (Signed)
12 Days Post-Op  Subjective: She says she thinks she is doing well, and that she is eating all she can.  Objective: Vital signs in last 24 hours: Temp:  [97.9 F (36.6 C)-98.2 F (36.8 C)] 97.9 F (36.6 C) (12/02 0243) Pulse Rate:  [97-101] 97 (12/02 0243) Resp:  [20-24] 20 (12/02 0243) BP: (104-125)/(62-74) 111/62 mmHg (12/02 0243) SpO2:  [96 %-97 %] 96 % (12/02 0243) Weight:  [45.3 kg (99 lb 13.9 oz)] 45.3 kg (99 lb 13.9 oz) (12/02 0350) Last BM Date: 08/10/13  Intake/Output from previous day: 12/01 0701 - 12/02 0700 In: 477 [P.O.:477] Out: 1450 [Urine:1350; Drains:100] Intake/Output this shift: 3 stools recorded, I cannot tell what she had PO Afebrile VSS, she stays a little tachycardic  Labs are stable. Prealbumin 11.1 No growth from culture, no organisms on gram stain Total I/O In: 90 [P.O.:90] Out: 120 [Urine:120]  General appearance: alert, cooperative and no distress Resp: clear to auscultation bilaterally GI: soft, non-tender; bowel sounds normal; no masses,  no organomegaly and draining purulent yellow material from base of wound.  i took out more staples and opend base draining area.  Starting wet to dry dressings.  Lab Results:   Recent Labs  08/09/13 0540 08/11/13 0456  WBC 10.1 9.3  HGB 8.3* 8.6*  HCT 25.2* 26.2*  PLT 396 461*    BMET  Recent Labs  08/09/13 0540 08/11/13 0456  NA 139 139  K 4.1 3.9  CL 105 104  CO2 26 29  GLUCOSE 102* 113*  BUN 13 13  CREATININE 0.49* 0.50  CALCIUM 8.2* 8.4   PT/INR No results found for this basename: LABPROT, INR,  in the last 72 hours   Recent Labs Lab 08/06/13 0425  AST 27  ALT 19  ALKPHOS 118*  BILITOT 0.3  PROT 5.1*  ALBUMIN 1.9*     Lipase     Component Value Date/Time   LIPASE 16 07/28/2013 0900     Studies/Results: No results found.  Medications: . antiseptic oral rinse  15 mL Mouth Rinse QID  . carboxymethylcellulose  1 drop Both Eyes TID WC & HS  .  ceFAZolin (ANCEF) IV   1 g Intravenous Q8H  . CVS EYE LUBRICANT NIGHTTIME  1 drop Ophthalmic QHS  . feeding supplement (ENSURE COMPLETE)  237 mL Oral BID BM  . feeding supplement (JEVITY 1.2 CAL)  1,000 mL Per Tube Q24H  . ferrous sulfate  325 mg Oral TID WC  . heparin subcutaneous  5,000 Units Subcutaneous Q8H  . insulin aspart  0-15 Units Subcutaneous Q4H  . lip balm  1 application Topical BID  . lipase/protease/amylase  2 capsule Oral TID WC  . loperamide  2 mg Oral QHS  . pantoprazole  40 mg Oral Q1200  . polycarbophil  625 mg Oral BID  . saccharomyces boulardii  250 mg Oral BID  . sodium chloride  10-40 mL Intracatheter Q12H  . Tafluprost  1 drop Ophthalmic QHS  . 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  Malnutrition (prealbumin 11.1)   Plan:  I have opened the lower part of the abdominal wound and will start wet to dry dressings here.  Keep her on Ancef for now.  I have ask nutrition to help with calorie count, but from what I can see she needs tube feedings as primary source.  She is taking in very little per the nursing staff. SNF bed is available and if the wound cleans up she may be able to go to  SNF near the end of the week.   She also has a drain remaining and I will see if we can pull that before she goes to SNF.    LOS: 14 days    [name] 08/11/2013

## 2013-08-11 NOTE — Progress Notes (Signed)
Calorie Count Note  Diet: Dysphagia 3 Supplements: Ensure Complete BID  Breakfast: 160 kcal, 3 grams protein Lunch: none documented Dinner: 140 kcal, 2 grams protein Supplements: 350 kcal, 13 grams protein   Total intake: 650 kcal (52% of minimum estimated needs)  18 grams protein (30% of minimum estimated needs)  PO intake has improved and pt feels she is eating better today. Pt reports early satiety. Encouraged pt to eat small frequent amounts, continue intake of Ensure Complete, and complete of 50% of meals.  Jevity 1.2 is running at 25 ml/hr via G-tube which provides 720 kcal, 33 grams of protein, and 486 ml of H2O. PO intake and TF combined provided 1370 kcal and 51 grams protein- 100% of estimated energy needs but, only 85% of estimated protein needs.   Nutrition Dx: Inadequate oral intake related to hiatal hernia as evidenced by reported intake less than estimated needs; ongoing  Goal: Pt to meet >/= 90% of their estimated nutrition needs; not met but improving  Intervention: Continue Ensure Complete BID Provide evening snack once daily  Ian Malkin RD, LDN Inpatient Clinical Dietitian Pager: [phone] After Hours Pager: [phone]

## 2013-08-11 NOTE — Progress Notes (Signed)
Occupational Therapy Treatment Patient Details Name: [name] MRN: [id number] DOB: [birth year] Today's Date: 08/11/2013 Time: 8416-6063 OT Time Calculation (min): 23 min  OT Assessment / Plan / Recommendation  History of present illness 15 F with large HH adm 11/18 by [facility] with intractable vomiting and concern for UGIB. Underwent EGD 11/19 revealing possible gastric volvulus. Intubated after EGD to protect airway and permit completion of eval.11-20 Exp lap (Rosenbower): Large hiatal hernia, gastric outlet obstruction, gastric volvulus, bezoar, esophageal perforation. PROCEDURE: lysis of adhesions, reduction of hiatal hernia, repair of esophageal perforation, Dor fundoplication, evacuation of gastric bezoar, placement of the gastrostomy-jejunostomy tube   OT comments  Patient tolerated sitting EOB x about 20 minutes for grooming activities.  Follow Up Recommendations  SNF;Supervision/Assistance - 24 hour    Barriers to Discharge       Equipment Recommendations  None recommended by OT    Recommendations for Other Services    Frequency Min 2X/week   Progress towards OT Goals Progress towards OT goals: Progressing toward goals  Plan Discharge plan remains appropriate    Precautions / Restrictions Precautions Precautions: Fall;Other (comment) Precaution Comments: abd incision Restrictions Weight Bearing Restrictions: No   Pertinent Vitals/Pain No c/o pain    ADL  Grooming: Performed;Wash/dry hands;Wash/dry face;Teeth care;Set up Where Assessed - Grooming: Unsupported sitting Transfers/Ambulation Related to ADLs: bed mobility min A overall supine to sit and sit to supine. ADL Comments: Patient supervision seated EOB to perform grooming tasks.    OT Diagnosis:    OT Problem List:   OT Treatment Interventions:     OT Goals(current goals can now be found in the care plan section) Acute Rehab OT Goals Patient Stated Goal: To regain independence; be able to do work around the  house  Visit Information  Last OT Received On: 08/11/13 Assistance Needed: +1 History of Present Illness: 49 F with large HH adm 11/18 by [facility] with intractable vomiting and concern for UGIB. Underwent EGD 11/19 revealing possible gastric volvulus. Intubated after EGD to protect airway and permit completion of eval.11-20 Exp lap (Rosenbower): Large hiatal hernia, gastric outlet obstruction, gastric volvulus, bezoar, esophageal perforation. PROCEDURE: lysis of adhesions, reduction of hiatal hernia, repair of esophageal perforation, Dor fundoplication, evacuation of gastric bezoar, placement of the gastrostomy-jejunostomy tube    Subjective Data      Prior Functioning       Cognition  Cognition Arousal/Alertness: Awake/alert Behavior During Therapy: WFL for tasks assessed/performed Overall Cognitive Status: Within Functional Limits for tasks assessed    Mobility       Exercises      Balance     End of Session OT - End of Session Activity Tolerance: Patient tolerated treatment well Patient left: in bed;with call bell/phone within reach  GO     Terryl Molinelli A 08/11/2013, 4:07 PM

## 2013-08-11 NOTE — Progress Notes (Signed)
Patient ID: [name], female   DOB: 10-04-1940, 72 y.o.   MRN: [id number]  [facility] PROGRESS NOTE  [name]:[id number] DOB: 13-Jul-1941 DOA: 07/28/2013 PCP: Hollice Espy, MD  Brief narrative:  33 F with large HH admitted 11/18 by [facility] with intractable vomiting and concern for UGIB. Underwent EGD 11/19 revealing possible gastric volvulus. Intubated after EGD to protect airway and permit completion of evaluation.   Assessment/Plan:  Recurrent recurrent large hiatal hernia/gastric outlet obstruction/esophageal perforation/gastric bezoar  - Status post laparotomy 07/30/2013. Patient passing gas, with loose stools. Post op day #12, still with poor oral intake  - management per primary team  - pt will likely need tube feeding as primary source given poor oral intake - possible d/c in several days per surgery  Cellulitis around surgical site, around staple  - ABX ancef per primary team day #2 - clinically improving, pt remains afebrile and WBC is within normal limits  Bradycardia/Tachycardia  - appreciate cardiology input  - CXR indicative of resolving pulmonary edema but persistent moderate size pleural effusion noted again  - no further work up indicated per cardio rec's as pt is asymptomatic with these episodes, they signed off 12/01  Hypokalemia./Hypomagnesemia  - Likely secondary to GI losses.  - supplemented and within normal limits this AM  Acute respiratory failure  - Secondary to compromised airway. Resolved. Extubated 07/31/2013  - Continue pulmonary hygiene. Mobilize.  - Keep sats greater than 92%.  Acute encephalopathy  - Secondary to acute illness a snoted above Resolved.  Hypertension  - reasonable inpatient control on no antihypertensives  - continue to hold home BP medications  Glaucoma  - Continue eyedrops.  Prophylaxis  - Protonix for GI prophylaxis. SCDs for DVT prophylaxis.  Anemia of chronic disease  - Hg and Hct overall stable  - no signs of  active bleed  - CBC in AM  Leukocytosis  - unclear etiology but also with mild tachycardia  - WBC is within normal limits 11/30 and pt remains afebrile over 72 hours  - CXR unremarkable for acute cardiopulmonary etiology  Severe malnutrition - in the setting of acute illness and poor oral intake - may need tube feeding per surgery   Code Status: Full  Family Communication: Discussed with patient no family at bedside.  Disposition Plan: Per primary team   Consultants:  PCCM  Gastroenterology: Dr. Christella Hartigan Procedures:  ETT 11/19 >>11/21  RUE PICC 11/19>>>  GJ Tube11-20>>>  11/19 - EGD: stomach was filled with solid brown, feculent appearing material. The gastric anatomy was very abnormal, distorted and twisted  11/19 - CT chest/abd/pelvis:  11/20 - Exp lap (Rosenbower): Large hiatal hernia, gastric outlet obstruction, gastric volvulus, bezoar, esophageal perforation. PROCEDURE: lysis of adhesions, reduction of hiatal hernia, repair of esophageal perforation, Dor fundoplication, evacuation of gastric bezoar, placement of the gastrostomy-jejunostomy tube  11/22 - Extubated 11/21 Deconditioned. In pain : needing fent q2h but able to sit in chair.  11/29 CXR Continued clearing of the lung fields bilaterally suggesting clearing of pulmonary edema and/or underlying effusions. Persistent moderate-sized pleural effusions. Antibiotics:  Ancef 12/01 -->  HPI/Subjective: No events overnight.   Objective: Filed Vitals:   08/11/13 0243 08/11/13 0350 08/11/13 0910 08/11/13 1316  BP: 111/62  114/69 109/62  Pulse: 97  98 108  Temp: 97.9 F (36.6 C)  98 F (36.7 C) 98.1 F (36.7 C)  TempSrc: Oral  Oral Oral  Resp: 20  20 20   Height:  Weight:  45.3 kg (99 lb 13.9 oz)    SpO2: 96%  100% 98%    Intake/Output Summary (Last 24 hours) at 08/11/13 1718 Last data filed at 08/11/13 1430  Gross per 24 hour  Intake    230 ml  Output   1720 ml  Net  -1490 ml    Exam:   General:  Pt is  alert, follows commands appropriately, not in acute distress  Cardiovascular: Regular rate and rhythm, no rubs, no gallops  Respiratory: Clear to auscultation bilaterally, no wheezing, no crackles, no rhonchi  Abdomen: Soft, non tender, erythema around stapes at the surgical site, non distended, bowel sounds present, no guarding  Extremities: No edema, pulses DP and PT palpable bilaterally  Neuro: Grossly nonfocal  Data Reviewed: Basic Metabolic Panel:  Recent Labs Lab 08/05/13 0455 08/06/13 0425 08/07/13 0550 08/08/13 0440 08/09/13 0540 08/11/13 0456  NA 139 136 139 138 139 139  K 3.8 4.5 4.1 4.1 4.1 3.9  CL 105 103 106 104 105 104  CO2 27 28 27 26 26 29   GLUCOSE 111* 113* 119* 119* 102* 113*  BUN 12 14 15 12 13 13   CREATININE 0.42* 0.48* 0.46* 0.43* 0.49* 0.50  CALCIUM 8.1* 8.5 8.1* 8.3* 8.2* 8.4  MG 2.0 1.9  --   --   --   --   PHOS 3.5 4.0  --   --   --   --    Liver Function Tests:  Recent Labs Lab 08/06/13 0425  AST 27  ALT 19  ALKPHOS 118*  BILITOT 0.3  PROT 5.1*  ALBUMIN 1.9*   CBC:  Recent Labs Lab 08/05/13 0455 08/06/13 0425 08/07/13 1200 08/08/13 0440 08/09/13 0540 08/11/13 0456  WBC 8.8 10.9* 10.9* 10.3 10.1 9.3  NEUTROABS 6.9 8.9*  --   --   --   --   HGB 8.7* 9.1* 9.0* 8.6* 8.3* 8.6*  HCT 26.1* 27.2* 27.2* 26.2* 25.2* 26.2*  MCV 87.0 86.9 88.0 88.2 88.4 88.8  PLT 280 345 388 388 396 461*   CBG:  Recent Labs Lab 08/11/13 0019 08/11/13 0400 08/11/13 0722 08/11/13 1120 08/11/13 1617  GLUCAP 109* 98 99 136* 93    Recent Results (from the past 240 hour(s))  CLOSTRIDIUM DIFFICILE BY PCR     Status: None   Collection Time    08/03/13  4:18 PM      Result Value Range Status   C difficile by pcr NEGATIVE  NEGATIVE Final   Comment: Performed at [facility]  WOUND CULTURE     Status: None   Collection Time    08/10/13 12:35 PM      Result Value Range Status   Specimen Description ABDOMEN Abdominal surgical incision    Final   Special Requests NONE   Final   Gram Stain     Final   Value: NO WBC SEEN     NO SQUAMOUS EPITHELIAL CELLS SEEN     NO ORGANISMS SEEN     Performed at [facility]   Culture     Final   Value: Culture reincubated for better growth     Performed at [facility]   Report Status PENDING   Incomplete     Scheduled Meds: . antiseptic oral rinse  15 mL Mouth Rinse QID  . carboxymethylcellulose  1 drop Both Eyes TID WC & HS  .  ceFAZolin (ANCEF) IV  1 g Intravenous Q8H  . CVS EYE LUBRICANT  NIGHTTIME  1 drop Ophthalmic QHS  . feeding supplement (ENSURE COMPLETE)  237 mL Oral BID BM  . feeding supplement (JEVITY 1.2 CAL)  1,000 mL Per Tube Q24H  . ferrous sulfate  325 mg Oral TID WC  . heparin subcutaneous  5,000 Units Subcutaneous Q8H  . insulin aspart  0-15 Units Subcutaneous Q4H  . lip balm  1 application Topical BID  . lipase/protease/amylase  2 capsule Oral TID WC  . loperamide  2 mg Oral QHS  . pantoprazole  40 mg Oral Q1200  . polycarbophil  625 mg Oral BID  . saccharomyces boulardii  250 mg Oral BID  . sodium chloride  10-40 mL Intracatheter Q12H  . Tafluprost  1 drop Ophthalmic QHS  . timolol  1 drop Both Eyes Custom   Continuous Infusions: . dextrose 5 % and 0.45 % NaCl with KCl 20 mEq/L 20 mL/hr (08/10/13 1500)  . sodium chloride 0.9 % 1,000 mL with potassium chloride 40 mEq infusion 50 mL/hr at 08/08/13 1715    Debbora Presto, MD  [facility] Pager [phone]  If 7PM-7AM, please contact night-coverage [email or web address] Password [email or web address] 08/11/2013, 5:18 PM   LOS: 14 days

## 2013-08-11 NOTE — Progress Notes (Signed)
Patient noted to have more episodes of bradycardia to <30 with sinus pause of 3.45. Patient asymptomatic, vital signs stable. On call for cardiology notified Dr Terressa Koyanagi. No new order received. Will cont to monitor .

## 2013-08-12 DIAGNOSIS — R000 Tachycardia, unspecified: Secondary | ICD-10-CM

## 2013-08-12 DIAGNOSIS — Z5189 Encounter for other specified aftercare: Secondary | ICD-10-CM

## 2013-08-12 LAB — GLUCOSE, CAPILLARY
Glucose-Capillary: 106 mg/dL — ABNORMAL HIGH (ref 70–99)
Glucose-Capillary: 109 mg/dL — ABNORMAL HIGH (ref 70–99)
Glucose-Capillary: 123 mg/dL — ABNORMAL HIGH (ref 70–99)
Glucose-Capillary: 127 mg/dL — ABNORMAL HIGH (ref 70–99)
Glucose-Capillary: 127 mg/dL — ABNORMAL HIGH (ref 70–99)
Glucose-Capillary: 84 mg/dL (ref 70–99)

## 2013-08-12 LAB — CBC
HCT: 27.2 % — ABNORMAL LOW (ref 36.0–46.0)
Hemoglobin: 8.7 g/dL — ABNORMAL LOW (ref 12.0–15.0)
Platelets: 443 10*3/uL — ABNORMAL HIGH (ref 150–400)
RDW: 14.2 % (ref 11.5–15.5)
WBC: 9.3 10*3/uL (ref 4.0–10.5)

## 2013-08-12 LAB — BASIC METABOLIC PANEL
BUN: 12 mg/dL (ref 6–23)
Chloride: 102 mEq/L (ref 96–112)
GFR calc Af Amer: >90 mL/min (ref >90)
Glucose, Bld: 104 mg/dL — ABNORMAL HIGH (ref 70–99)
Potassium: 3.7 mEq/L (ref 3.5–5.1)

## 2013-08-12 MED ORDER — JEVITY 1.2 CAL PO LIQD
237.0000 mL | Freq: Three times a day (TID) | ORAL | Status: DC
  Administered 2013-08-12 – 2013-08-13 (×4): 237 mL
  Filled 2013-08-12 (×8): qty 237

## 2013-08-12 MED ORDER — CEPHALEXIN 250 MG/5ML PO SUSR
250.0000 mg | Freq: Three times a day (TID) | ORAL | Status: DC
  Administered 2013-08-12 – 2013-08-13 (×4): 250 mg via ORAL
  Filled 2013-08-12 (×6): qty 5

## 2013-08-12 NOTE — Progress Notes (Signed)
[facility] PROGRESS NOTE  [name] GNF:[id number] DOB: June 19, 1941 DOA: 07/28/2013 PCP: Hollice Espy, MD  Assessment/Plan  Recurrent recurrent large hiatal hernia/gastric outlet obstruction/esophageal perforation/gastric bezoar  - Status post laparotomy 07/30/2013. Patient passing gas, with loose stools. Post op day #13, still with poor oral intake  - continue tube feeds to supplement oral intake - appreciate nutrition recommendations - possible d/c tomorrow or Friday -  Awaiting removal of the right sided abdominal drain    Cellulitis around surgical site, around staple with some residual pinkness surrounding site - ABX ancef per primary team day #3 -  Reexamine in AM  Bradycardia/Tachycardia syndrome.  Evaluated by cardiology and due to pauses and episodes of bradycardia is not a candidate for rate control medications despite tachycardia.   - appreciate cardiology input  -  Telemetry with sinus tach to 120s  Pulmonary edema and bilateral pleural effusions may have been due to volume resuscitation in setting of grade 1 DD, resolving -  Appreciate cardiology assistance   Hypokalemia./Hypomagnesemia, secondary to GI losses.  resolved with IV supplementations  Acute respiratory failure, intubated to protect airway due to intractable nausea and vomiting at admissioln -  Extubated 07/31/2013  - Continue pulmonary hygiene. Mobilize.  - Keep sats greater than 92%.   Acute encephalopathy was secondary to HA delirium and resolved  Hypertension BP stable on no BP medications  Glaucoma, stable.  Continue eyedrops.   Anemia of chronic disease stable hemoglobin.  No obvious blood loss  Leukocytosis, resolved.  Likely related to stress from initial presentation.  CXR negative for infection.    Severe malnutrition.  Continue tube feeds as above.   - continue to encourage   Hyperglycemia without diabetes.   - d/c SSI  Diet:  Dysphagia 3 Access:  PIV IVF:  TL  PICC Proph:  heparin  Code Status: full Family Communication: patient alone Disposition Plan: pending removal of drain, likely to SNF in next 1-2 days   Consultants:  PCCM  Gastroenterology: Dr. Christella Hartigan Procedures:  ETT 11/19 >>11/21  RUE PICC 11/19>>>  GJ Tube11-20>>>  11/19 - EGD: stomach was filled with solid brown, feculent appearing material. The gastric anatomy was very abnormal, distorted and twisted  11/19 - CT chest/abd/pelvis:  11/20 - Exp lap (Rosenbower): Large hiatal hernia, gastric outlet obstruction, gastric volvulus, bezoar, esophageal perforation. PROCEDURE: lysis of adhesions, reduction of hiatal hernia, repair of esophageal perforation, Dor fundoplication, evacuation of gastric bezoar, placement of the gastrostomy-jejunostomy tube  11/22 - Extubated 11/21 Deconditioned. In pain : needing fent q2h but able to sit in chair.  11/29 CXR Continued clearing of the lung fields bilaterally suggesting clearing of pulmonary edema and/or underlying effusions. Persistent moderate-sized pleural effusions. Antibiotics:  Ancef 12/01 -->   HPI/Subjective:  States she does not have the same full/nausea sensation with eating that she had before, however, she is still not eating very well.    Objective: Filed Vitals:   08/11/13 2044 08/12/13 0444 08/12/13 0800 08/12/13 1338  BP: 127/71 95/68 116/66 102/63  Pulse: 97 88 112 109  Temp: 97.4 F (36.3 C) 97.2 F (36.2 C) 97.4 F (36.3 C) 98.3 F (36.8 C)  TempSrc: Oral Oral Oral Oral  Resp: 18 19 21 20   Height:      Weight:  45.3 kg (99 lb 13.9 oz)    SpO2: 99% 99% 97% 100%    Intake/Output Summary (Last 24 hours) at 08/12/13 1537 Last data filed at 08/12/13 1418  Gross per 24 hour  Intake    830 ml  Output   1850 ml  Net  -1020 ml   Filed Weights   08/10/13 0500 08/11/13 0350 08/12/13 0444  Weight: 45.723 kg (100 lb 12.8 oz) 45.3 kg (99 lb 13.9 oz) 45.3 kg (99 lb 13.9 oz)    Exam:   General:  Thin CF, No acute  distress  HEENT:  NCAT, MMM  Cardiovascular:  Tachycardic, regular rhythm, nl S1, S2 no mrg, 2+ pulses, warm extremities  Respiratory:  Diminished at bilateral bases, no increased WOB  Abdomen:   NABS, soft, NT/ND, abdominal incision with staples intact superiorly, serosanguinous drainage of wet to dry dressing packed inferiorly (not removed for exam), drain with serous fluid right abdomen.  MSK:   Normal tone and bulk, no LEE  Neuro:  Grossly intact  Data Reviewed: Basic Metabolic Panel:  Recent Labs Lab 08/06/13 0425 08/07/13 0550 08/08/13 0440 08/09/13 0540 08/11/13 0456 08/12/13 0415  NA 136 139 138 139 139 138  K 4.5 4.1 4.1 4.1 3.9 3.7  CL 103 106 104 105 104 102  CO2 28 27 26 26 29 30   GLUCOSE 113* 119* 119* 102* 113* 104*  BUN 14 15 12 13 13 12   CREATININE 0.48* 0.46* 0.43* 0.49* 0.50 0.52  CALCIUM 8.5 8.1* 8.3* 8.2* 8.4 8.5  MG 1.9  --   --   --   --   --   PHOS 4.0  --   --   --   --   --    Liver Function Tests:  Recent Labs Lab 08/06/13 0425  AST 27  ALT 19  ALKPHOS 118*  BILITOT 0.3  PROT 5.1*  ALBUMIN 1.9*   No results found for this basename: LIPASE, AMYLASE,  in the last 168 hours No results found for this basename: AMMONIA,  in the last 168 hours CBC:  Recent Labs Lab 08/06/13 0425 08/07/13 1200 08/08/13 0440 08/09/13 0540 08/11/13 0456 08/12/13 0415  WBC 10.9* 10.9* 10.3 10.1 9.3 9.3  NEUTROABS 8.9*  --   --   --   --   --   HGB 9.1* 9.0* 8.6* 8.3* 8.6* 8.7*  HCT 27.2* 27.2* 26.2* 25.2* 26.2* 27.2*  MCV 86.9 88.0 88.2 88.4 88.8 89.5  PLT 345 388 388 396 461* 443*   Cardiac Enzymes: No results found for this basename: CKTOTAL, CKMB, CKMBINDEX, TROPONINI,  in the last 168 hours BNP (last 3 results)  Recent Labs  08/03/13 0620  PROBNP 252.7*   CBG:  Recent Labs Lab 08/11/13 2028 08/11/13 2358 08/12/13 0356 08/12/13 0715 08/12/13 1136  GLUCAP 125* 123* 109* 106* 127*    Recent Results (from the past 240 hour(s))   CLOSTRIDIUM DIFFICILE BY PCR     Status: None   Collection Time    08/03/13  4:18 PM      Result Value Range Status   C difficile by pcr NEGATIVE  NEGATIVE Final   Comment: Performed at [facility]  WOUND CULTURE     Status: None   Collection Time    08/10/13 12:35 PM      Result Value Range Status   Specimen Description ABDOMEN Abdominal surgical incision   Final   Special Requests NONE   Final   Gram Stain     Final   Value: NO WBC SEEN     NO SQUAMOUS EPITHELIAL CELLS SEEN     NO ORGANISMS SEEN     Performed at [facility]  Culture     Final   Value: MULTIPLE ORGANISMS PRESENT, NONE PREDOMINANT     Performed at [facility]   Report Status PENDING   Incomplete  WOUND CULTURE     Status: None   Collection Time    08/11/13  2:07 PM      Result Value Range Status   Specimen Description WOUND ABDOMINAL   Final   Special Requests NONE   Final   Gram Stain     Final   Value: NO WBC SEEN     NO SQUAMOUS EPITHELIAL CELLS SEEN     NO ORGANISMS SEEN     Performed at [facility]   Culture     Final   Value: Culture reincubated for better growth     Performed at [facility]   Report Status PENDING   Incomplete     Studies: No results found.  Scheduled Meds: . antiseptic oral rinse  15 mL Mouth Rinse QID  . carboxymethylcellulose  1 drop Both Eyes TID WC & HS  . cephALEXin  250 mg Oral Q8H  . CVS EYE LUBRICANT NIGHTTIME  1 drop Ophthalmic QHS  . feeding supplement (ENSURE COMPLETE)  237 mL Oral BID BM  . feeding supplement (JEVITY 1.2 CAL)  1,000 mL Per Tube Q24H  . feeding supplement (JEVITY 1.2 CAL)  237 mL Per Tube TID  . ferrous sulfate  325 mg Oral TID WC  . heparin subcutaneous  5,000 Units Subcutaneous Q8H  . insulin aspart  0-15 Units Subcutaneous Q4H  . lip balm  1 application Topical BID  . lipase/protease/amylase  2 capsule Oral TID WC  . loperamide  2 mg Oral QHS  . pantoprazole  40 mg Oral Q1200  . polycarbophil   625 mg Oral BID  . saccharomyces boulardii  250 mg Oral BID  . sodium chloride  10-40 mL Intracatheter Q12H  . Tafluprost  1 drop Ophthalmic QHS  . timolol  1 drop Both Eyes Custom   Continuous Infusions: . dextrose 5 % and 0.45 % NaCl with KCl 20 mEq/L 20 mL/hr (08/10/13 1500)  . sodium chloride 0.9 % 1,000 mL with potassium chloride 40 mEq infusion 50 mL/hr at 08/08/13 1715    Principal Problem:   Gastric outlet obstruction Active Problems:   Irritable bowel syndrome   GI bleed   Persistent recurrent vomiting   Nausea with vomiting   Acute respiratory failure with hypoxia   Protein calorie malnutrition   Esophageal perforation   Volvulus   Gastric bezoar   HH (hiatus hernia): RECURRENT   Hypokalemia   Hypomagnesemia   Bradycardia    Time spent: 30 min    Tremon Sainvil, [facility]  [facility] Pager [phone]. If 7PM-7AM, please contact night-coverage at [email or web address], password [facility] 08/12/2013, 3:37 PM  LOS: 15 days

## 2013-08-12 NOTE — Progress Notes (Signed)
Calorie Count Note  Diet: Dysphagia 3 Supplements: Ensure Complete BID  Breakfast: 97 kcal, 3 grams protein Lunch: 37 kcal, 0 grams protein Dinner: 309 kcal, 3 grams protein Supplements: 350 kcal, 13 grams  Total intake:  443 kcal (35% of minimum estimated needs)   19 grams protein (32% of minimum estimated needs)  PO intake continues to be inadequate. Pt only eats a couple bites of each item on meal tray.  Jevity 1.2 is running at 25 ml/hr via G-tube which provides 720 kcal, 33 grams of protein, and 486 ml of H2O. PO intake and TF combined provided 1163 kcal and 52 grams protein- 93% of estimated energy needs and 87% of estimated protein needs.   Nutrition Dx: Inadequate oral intake related to hiatal hernia as evidenced by reported intake less than estimated needs; ongoing  Goal: Pt to meet >/= 90% of their estimated nutrition needs; not met but improving  Intervention: Continue Ensure Complete BID Continue evening snack once daily Continue to encourage PO intake Recommend continuing tube feeds; recommend providing bolus tube feeds- 1 can of Jevity 1.2 TID via G-tube daily in between meals to provide 855 kcal and 34 grams of protein daily.   Ian Malkin RD, LDN Inpatient Clinical Dietitian Pager: [phone] After Hours Pager: [phone]

## 2013-08-12 NOTE — Progress Notes (Signed)
Physical Therapy Treatment Patient Details Name: [name] MRN: [id number] DOB: 03/17/1941 Today's Date: 08/12/2013 Time: 7846-9629 PT Time Calculation (min): 27 min  PT Assessment / Plan / Recommendation  History of Present Illness 75 F with large HH adm 11/18 by [facility] with intractable vomiting and concern for UGIB. Underwent EGD 11/19 revealing possible gastric volvulus. Intubated after EGD to protect airway and permit completion of eval.11-20 Exp lap (Rosenbower): Large hiatal hernia, gastric outlet obstruction, gastric volvulus, bezoar, esophageal perforation. PROCEDURE: lysis of adhesions, reduction of hiatal hernia, repair of esophageal perforation, Dor fundoplication, evacuation of gastric bezoar, placement of the gastrostomy-jejunostomy tube   PT Comments   Pt doing well with mobility and plans to d/c to SNF in the next couple days.  Follow Up Recommendations  SNF     Does the patient have the potential to tolerate intense rehabilitation     Barriers to Discharge        Equipment Recommendations  None recommended by PT    Recommendations for Other Services    Frequency Min 3X/week   Progress towards PT Goals Progress towards PT goals: Progressing toward goals  Plan Current plan remains appropriate    Precautions / Restrictions Precautions Precautions: Fall;Other (comment) Precaution Comments: abd incision   Pertinent Vitals/Pain Pt reports stomach "knotting" during ambulation.  Also R heel pain with ambulation however wished to continue.  Elevated LEs on pillows end of session in supine to float heels   Mobility  Bed Mobility Bed Mobility: Sit to Supine;Supine to Sit Supine to Sit: 4: Min assist Sit to Supine: HOB flat;4: Min assist Details for Bed Mobility Assistance: assist for trunk upright and LEs onto bed Transfers Transfers: Sit to Stand;Stand to Sit;Stand Pivot Transfers Sit to Stand: 4: Min guard;With upper extremity assist;From chair/3-in-1 Stand to Sit: 4:  Min guard;With upper extremity assist;To chair/3-in-1;To bed Stand Pivot Transfers: 4: Min guard Details for Transfer Assistance: verbal cues for hand placement, verbal cues for using RW with pivot from [facility] to bed, min/guard today as pt reports stomach in "knots" Ambulation/Gait Ambulation/Gait Assistance: 5: Supervision Ambulation Distance (Feet): 400 Feet Assistive device: Rolling walker Ambulation/Gait Assistance Details: verbal cues for posture Gait Pattern: Step-through pattern;Trunk flexed;Decreased stride length Gait velocity: decreased today due to stomach "knotting"    Exercises General Exercises - Lower Extremity Ankle Circles/Pumps: AROM;15 reps;Both Heel Slides: AROM;Both;15 reps Hip ABduction/ADduction: AROM;Both;15 reps   PT Diagnosis:    PT Problem List:   PT Treatment Interventions:     PT Goals (current goals can now be found in the care plan section)    Visit Information  Last PT Received On: 08/12/13 Assistance Needed: +1 History of Present Illness: 38 F with large HH adm 11/18 by [facility] with intractable vomiting and concern for UGIB. Underwent EGD 11/19 revealing possible gastric volvulus. Intubated after EGD to protect airway and permit completion of eval.11-20 Exp lap (Rosenbower): Large hiatal hernia, gastric outlet obstruction, gastric volvulus, bezoar, esophageal perforation. PROCEDURE: lysis of adhesions, reduction of hiatal hernia, repair of esophageal perforation, Dor fundoplication, evacuation of gastric bezoar, placement of the gastrostomy-jejunostomy tube    Subjective Data      Cognition  Cognition Arousal/Alertness: Awake/alert Behavior During Therapy: WFL for tasks assessed/performed Overall Cognitive Status: Within Functional Limits for tasks assessed    Balance     End of Session PT - End of Session Activity Tolerance: Patient tolerated treatment well Patient left: in bed;with call bell/phone within reach;with nursing/sitter in room International aid/development worker in  room)   GP     Madysen Faircloth,KATHrine E 08/12/2013, 1:00 PM Zenovia Jarred, PT, DPT 08/12/2013 Pager: [phone]

## 2013-08-12 NOTE — Discharge Summary (Signed)
Physician Discharge Summary  Patient ID: [name] MRN: [id number] DOB/AGE: 02-17-1941 72 y.o.  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  Malnutrition (prealbumin 11.1)  Diarrhea on tube feedings   Principal Problem:   Gastric outlet obstruction Active Problems:   Irritable bowel syndrome   GI bleed   Persistent recurrent vomiting   Nausea with vomiting   Acute respiratory failure with hypoxia   Protein calorie malnutrition   Esophageal perforation   Volvulus   Gastric bezoar   HH (hiatus hernia): RECURRENT   Hypokalemia   Hypomagnesemia   Bradycardia   PROCEDURES: Exploratory Laparotomy, Lysis of Adhesions, Reduction of Hiatal Hernia, Repair of Esophogeal Perforations, Dor Fundal Plication, Evacuation of Gastric Bezoar, Gastrostomy-Jejunostomy Tube Insertion, Upper Endoscopy by Dr. Abbey Chatters  07/30/2013.Marland Kitchen    Hospital Course:  72 year old female with a history of large hiatus hernia s/p Nissen, diverticulosis, GI bleed, GERD, hypertension presents with one-day history of intractable vomiting. The patient complained of some crampy left upper abdominal pain one to 2 days ago which seems to have resolved. She blames this on her IBS. She has complained of some constipation since starting Vesicare 1 week ago, but she had 3 BM  yesterday. She denies any hematochezia, melena. She had too numerous to count episodes of vomitus yesterday. She noted some coffee grounds as well as a small amount of bright red blood. She called her gastroenterologist yesterday who suggested that the patient emergency department which she did this morning. She denies any recent use of NSAIDS or other new meds. She denies fevers, chills, chest pain, shortness of breath, headaches, diarrhea, dysuria, hematuria. She does complain of some dizziness since her vomiting started.  In the ED, the patient continues to have emesis with coffee grounds material during my examination. The patient's blood pressure is stable. Initially, the patient had a heart rate 127 which improved to 110s after one liter NS. Hemoglobin was 14.1, WBC 10.8. BMP and hepatic enzymes were unremarkable. Lipase was 16. Chest x-ray did not show any infiltrates or edema. I have requested an additional dose of zofran and one time dose of reglan. EKG shows sinus tachycardia with nonspecific ST-T wave changes. INR was 1.14. PTT 27, lactic acid 2.20. She was seen and admitted by the Hospitalist.  She was then evaluated and underwent endoscopy with Dr. Rob Bunting. EGD showed feculent material in the esophagus/oropharynx. He had the pt intubated to protect her airway, she was placed in ICU.  The concern was a gastric volvulus with most of the stomach already in the chest.  Prior hx of Nissen fundoplication, recurrent hiatal hernia, and ongoing problems dating back to 2000.  NG was placed for gastric decompression and had to be place by Dr. Christella Hartigan via Endoscopy She made no improvement and was taken to the OR on 07/30/13.  She actually did very well post op, and was extubated 07/31/13.  On 08/04/13 she had an upper GI series that showed  no evidence of a leak.  She had a gastrojejunostomy tube place but the J portion came out of position fairly quickly.  After swallow evaluation she was place on PO's and  this was advanced.  She eats very little, but we have been doing continuous tube feeds at 25 ml per hour, along with PO's, and Ensure supplements.  She still won't eat much, and additional Jevity along with Ensure has been added to maintain calorie and protein needs.  On 12 /1/14 The lower abdominal wound was red with cellulitis at the base and mid portion of the wound. Wound was sealed but by the following Am she had drainage coming from lower portion of the wound.  This was opened with a applicator stick and she drained more.  We started wet to dry dressing and Ancef at that point.  This is cleaning up nicely and if she continues to do well we hope to send to SNF for rehab  No growth or organisms seen on gram stain. We have removed the remaining staples and drain.  Her wound is cleaning up nicely and doing well with wet to dry dressings. She has the top portion of the abdominal wound steri stripped and these can come off in a week 08/20/13. If she does well with her nutrition we hope to send her to SNF in AM 08/15/13.       Disposition: 01-Home or Self Care  Current nutrition recommendations: Discontinue Ensure Supplements  Change TF regimen to bolus only: half can of Vital AF 1.2 QID to provide 568 kcal, 36 grams protein, and 384 ml of H2O  Provide Carnation Instant Breakfast PO once daily, provides approximately 220 kcal and 13 grams of protein  Discussed diet tips to help prevent diarrhea  Continue Florastor       Discharge Orders   Future Appointments Provider Department Dept Phone   08/28/2013 4:00 PM Iva Boop, MD [facility] Gastroenterology [phone]   09/16/2013 11:00 AM [facility] [phone]   Future Orders Complete By Expires   Care order/instruction  As directed    Scheduling Instructions:     Bid dressing changes to open wound.  Pack with wet to dry 4 x 4, or kerlix to base.  Use  Sterile Normal saline as the solution        Medication List         acetaminophen 325 MG tablet  Commonly known as:  TYLENOL  Take 1-2 tablets (325-650 mg total) by mouth every 6 (six) hours as needed for fever, headache, mild pain or moderate pain.     alosetron 1 MG tablet  Commonly known as:  LOTRONEX  Take 1 mg by mouth daily with breakfast.     amLODipine-benazepril 5-20 MG per capsule  Commonly known as:  LOTREL  Take 1 capsule by mouth daily after breakfast. Patient took at home prior to arrival to Short Stay     aspirin 81 MG tablet  Take 81 mg by mouth daily after breakfast.     BENEFIBER Tabs  Take 2 tablets by mouth 2 (two) times daily.     CENTRUM ULTRA WOMENS Tabs  Take 1 tablet by mouth daily.     CREON 24000 UNITS Cpep  Generic drug:  Pancrelipase (Lip-Prot-Amyl)  Take 3 capsules by mouth 3 (three) times daily with meals. Take 3 caps with each meal and 3 caps with each snack     feeding supplement (VITAL AF  1.2 CAL) Liqd  Place 120 mLs into feeding tube 4 (four) times daily.     ferrous sulfate 325 (65 FE) MG tablet  Take 1 tablet (325 mg total) by mouth 3 (three) times daily with meals.     ibandronate 150 MG tablet  Commonly known as:  BONIVA  Take 150 mg by mouth every 3 (three) months. Take 1 tablet daily for one week then off one week     loperamide 2 MG capsule  Commonly known as:  IMODIUM  Take 2 capsules (4 mg total) by mouth at bedtime.     loperamide 2 MG capsule  Commonly known as:  IMODIUM  Take 1 capsule (2 mg total) by mouth 3 (three) times daily.     omeprazole 20 MG capsule  Commonly known as:  PRILOSEC  Take 20 mg by mouth daily before breakfast.     ondansetron 4 MG tablet  Commonly known as:  ZOFRAN  Take 1 tablet (4 mg total) by mouth every 6 (six) hours as needed for nausea or vomiting.     oxyCODONE 5 MG immediate release tablet  Commonly known as:  Oxy IR/ROXICODONE  Take 1-2 tablets (5-10 mg total) by mouth every 4 (four) hours as needed for moderate pain, severe  pain or breakthrough pain.     REFRESH LACRI-LUBE Oint  Apply 1 drop to eye 4 (four) times daily.     saccharomyces boulardii 250 MG capsule  Commonly known as:  FLORASTOR  Take 1 capsule (250 mg total) by mouth 2 (two) times daily.     timolol 0.5 % ophthalmic solution  Commonly known as:  BETIMOL  Place 1 drop into both eyes 2 (two) times daily.     Vitamin D (Ergocalciferol) 50000 UNITS Caps capsule  Commonly known as:  DRISDOL  Take 50,000 Units by mouth every 14 (fourteen) days.     ZIOPTAN 0.0015 % Soln  Generic drug:  Tafluprost  Apply 1 drop to eye at bedtime.       Follow-up Information   Follow up with ROSENBOWER,TODD J, MD. Schedule an appointment as soon as possible for a visit in 2 weeks. (10 - 14 day from discharge.  Sooner if you have an issue.)    Specialty:  General Surgery   Contact information:   [address] [place] [address] [phone]       Schedule an appointment as soon as possible for a visit with Hollice Espy, MD. (Before discharge from [facility].)    Specialty:  Family Medicine   Contact information:   [address] [place] [address] [phone]       Signed: Sherrie George 08/14/2013, 2:13 PM

## 2013-08-12 NOTE — Progress Notes (Signed)
13 Days Post-Op  Subjective: She seems to be doing fairly well.  The abdominal wound looks much better. I saw her breakfast and she didn't eat more than a few bites of anything.  Objective: Vital signs in last 24 hours: Temp:  [97.2 F (36.2 C)-98.1 F (36.7 C)] 97.2 F (36.2 C) (12/03 0444) Pulse Rate:  [88-108] 88 (12/03 0444) Resp:  [18-20] 19 (12/03 0444) BP: (95-127)/(62-71) 95/68 mmHg (12/03 0444) SpO2:  [98 %-100 %] 99 % (12/03 0444) Weight:  [45.3 kg (99 lb 13.9 oz)] 45.3 kg (99 lb 13.9 oz) (12/03 0444) Last BM Date: 08/12/13 BM this AM Afebrile VSS Intake/Output from previous day: 12/02 0701 - 12/03 0700 In: 940 [P.O.:230; I.V.:160; NG/GT:300; IV Piggyback:250] Out: 1750 [Urine:1740; Drains:10] Intake/Output this shift: Total I/O In: 60 [P.O.:60] Out: -   General appearance: alert, cooperative and no distress Resp: clear to auscultation bilaterally GI: soft sore, +BS, drain is clear.  Gastrostomy tube is OK. Open wound at base looks good, erythema is much better.  Lab Results:   Recent Labs  08/11/13 0456 08/12/13 0415  WBC 9.3 9.3  HGB 8.6* 8.7*  HCT 26.2* 27.2*  PLT 461* 443*    BMET  Recent Labs  08/11/13 0456 08/12/13 0415  NA 139 138  K 3.9 3.7  CL 104 102  CO2 29 30  GLUCOSE 113* 104*  BUN 13 12  CREATININE 0.50 0.52  CALCIUM 8.4 8.5   PT/INR No results found for this basename: LABPROT, INR,  in the last 72 hours   Recent Labs Lab 08/06/13 0425  AST 27  ALT 19  ALKPHOS 118*  BILITOT 0.3  PROT 5.1*  ALBUMIN 1.9*     Lipase     Component Value Date/Time   LIPASE 16 07/28/2013 0900     Studies/Results: No results found.  Medications: . antiseptic oral rinse  15 mL Mouth Rinse QID  . carboxymethylcellulose  1 drop Both Eyes TID WC & HS  .  ceFAZolin (ANCEF) IV  1 g Intravenous Q8H  . CVS EYE LUBRICANT NIGHTTIME  1 drop Ophthalmic QHS  . feeding supplement (ENSURE COMPLETE)  237 mL Oral BID BM  . feeding supplement  (JEVITY 1.2 CAL)  1,000 mL Per Tube Q24H  . ferrous sulfate  325 mg Oral TID WC  . heparin subcutaneous  5,000 Units Subcutaneous Q8H  . insulin aspart  0-15 Units Subcutaneous Q4H  . lip balm  1 application Topical BID  . lipase/protease/amylase  2 capsule Oral TID WC  . loperamide  2 mg Oral QHS  . pantoprazole  40 mg Oral Q1200  . polycarbophil  625 mg Oral BID  . saccharomyces boulardii  250 mg Oral BID  . sodium chloride  10-40 mL Intracatheter Q12H  . Tafluprost  1 drop Ophthalmic QHS  . 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  Malnutrition (prealbumin 11.1)   Plan:  Wound looks much better, no growth on the wound culture.  I am going to switch her to Keflex and stop the Ancef.  We need to decide on pulling remaining drain, and if she can go to SNF tomorrow or Friday.       LOS: 15 days    [name] 08/12/2013

## 2013-08-13 DIAGNOSIS — S31109A Unspecified open wound of abdominal wall, unspecified quadrant without penetration into peritoneal cavity, initial encounter: Secondary | ICD-10-CM

## 2013-08-13 LAB — GLUCOSE, CAPILLARY
Glucose-Capillary: 139 mg/dL — ABNORMAL HIGH (ref 70–99)
Glucose-Capillary: 112 mg/dL — ABNORMAL HIGH (ref 70–99)

## 2013-08-13 LAB — WOUND CULTURE: Gram Stain: NONE SEEN

## 2013-08-13 LAB — CLOSTRIDIUM DIFFICILE BY PCR: Toxigenic C. Difficile by PCR: NEGATIVE

## 2013-08-13 NOTE — Progress Notes (Signed)
14 Days Post-Op  Subjective: She feels OK, she wasn't really aware or remembered drain coming out last PM.  She is having some trouble with volume issue with TF.  Dr. Malachi Bonds is checking C Diff to see if this is causing diarrhea.  Objective: Vital signs in last 24 hours: Temp:  [98.1 F (36.7 C)-98.6 F (37 C)] 98.1 F (36.7 C) (12/04 0450) Pulse Rate:  [105-109] 108 (12/04 0450) Resp:  [18-20] 20 (12/04 0450) BP: (102-115)/(63-75) 115/73 mmHg (12/04 0450) SpO2:  [94 %-100 %] 94 % (12/04 0450) Weight:  [46.6 kg (102 lb 11.8 oz)] 46.6 kg (102 lb 11.8 oz) (12/04 0450) Last BM Date: 08/12/13 5 stools yesterday Afebrile, VSS   Intake/Output from previous day: 12/03 0701 - 12/04 0700 In: 417 [P.O.:180] Out: 1195 [Urine:1150; Drains:45] Intake/Output this shift: Total I/O In: 60 [P.O.:60] Out: 300 [Urine:300]  General appearance: alert, cooperative and no distress Resp: clear to auscultation bilaterally GI: soft, non-tender; bowel sounds normal; no masses,  no organomegaly and drain site is red, I took out the stitch.  Open lower abdominal would is OK, cellulitis is better.  base is kind of white fibrous, sides of wound are OK.  Lab Results:   Recent Labs  08/11/13 0456 08/12/13 0415  WBC 9.3 9.3  HGB 8.6* 8.7*  HCT 26.2* 27.2*  PLT 461* 443*    BMET  Recent Labs  08/11/13 0456 08/12/13 0415  NA 139 138  K 3.9 3.7  CL 104 102  CO2 29 30  GLUCOSE 113* 104*  BUN 13 12  CREATININE 0.50 0.52  CALCIUM 8.4 8.5   PT/INR No results found for this basename: LABPROT, INR,  in the last 72 hours  No results found for this basename: AST, ALT, ALKPHOS, BILITOT, PROT, ALBUMIN,  in the last 168 hours   Lipase     Component Value Date/Time   LIPASE 16 07/28/2013 0900     Studies/Results: No results found.  Medications: . antiseptic oral rinse  15 mL Mouth Rinse QID  . carboxymethylcellulose  1 drop Both Eyes TID WC & HS  . cephALEXin  250 mg Oral Q8H  . CVS EYE  LUBRICANT NIGHTTIME  1 drop Ophthalmic QHS  . feeding supplement (ENSURE COMPLETE)  237 mL Oral BID BM  . feeding supplement (JEVITY 1.2 CAL)  1,000 mL Per Tube Q24H  . feeding supplement (JEVITY 1.2 CAL)  237 mL Per Tube TID  . ferrous sulfate  325 mg Oral TID WC  . heparin subcutaneous  5,000 Units Subcutaneous Q8H  . lip balm  1 application Topical BID  . lipase/protease/amylase  2 capsule Oral TID WC  . loperamide  2 mg Oral QHS  . pantoprazole  40 mg Oral Q1200  . polycarbophil  625 mg Oral BID  . saccharomyces boulardii  250 mg Oral BID  . sodium chloride  10-40 mL Intracatheter Q12H  . Tafluprost  1 drop Ophthalmic QHS  . 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  Malnutrition (prealbumin 11.1)      Plan:  I think we can plan to send her to SNF tomorrow.  C diff is pending.  Wound is OK, TF and volumes are still an issue and I hope Nutrition will see again today.. I have remove staples from upper wound and steri stripped it .  It looks good so far.   LOS: 16 days    Marshell Rieger 08/13/2013

## 2013-08-13 NOTE — Progress Notes (Signed)
[facility] PROGRESS NOTE  [name]:[id number] DOB: [birth year] DOA: 07/28/2013 PCP: Hollice Espy, MD  Assessment/Plan  Recurrent recurrent large hiatal hernia/gastric outlet obstruction/esophageal perforation/gastric bezoar  - Status post laparotomy 07/30/2013. Patient passing gas, with loose stools. Post op day #13, still with poor oral intake  - continue tube feeds to supplement oral intake - appreciate nutrition recommendations - possible d/c tomorrow or Friday -  Awaiting removal of the right sided abdominal drain   Diarrhea, likely due to motility problems postoperatively -  C. difficile: Negative -  Continue Imodium   Cellulitis around surgical site, around staple with some residual pinkness surrounding site - ABX ancef per primary team day #3 -  Reexamine in AM -  Abd wound cultures with mixed flora but no Strep or Staph  Bradycardia/Tachycardia syndrome.  Evaluated by cardiology and due to pauses and episodes of bradycardia is not a candidate for rate control medications despite tachycardia.   - Telemetry: Generally a sinus tachycardia in the 110s, however occasional bradycardia down to the 30s and 40s. This rhythm has been occurring since the patient was admitted to the hospital and she has been evaluated by cardiology. -  DC telemetry  Pulmonary edema and bilateral pleural effusions may have been due to volume resuscitation in setting of grade 1 DD, resolving -  Appreciate cardiology assistance   Hypokalemia./Hypomagnesemia, secondary to GI losses.  resolved with IV supplementations  Acute respiratory failure, intubated to protect airway due to intractable nausea and vomiting at admissioln -  Extubated 07/31/2013  - Continue pulmonary hygiene. Mobilize.  - Keep sats greater than 92%.   Acute encephalopathy was secondary to HA delirium and resolved  Hypertension BP stable on no BP medications  Glaucoma, stable.  Continue eyedrops.   Anemia of  chronic disease stable hemoglobin.  No obvious blood loss  Leukocytosis, resolved.  Likely related to stress from initial presentation.  CXR negative for infection.    Severe malnutrition.  Continue tube feeds as above.   - continue to encourage   Hyperglycemia without diabetes.   - d/c SSI  Diet:  Dysphagia 3 Access:  PIV IVF:  TL PICC Proph:  heparin  Code Status: full Family Communication: patient alone Disposition Plan: pending removal of drain, likely to SNF tomorrow   Consultants:  PCCM  Gastroenterology: Dr. Christella Hartigan Procedures:  ETT 11/19 >>11/21  RUE PICC 11/19>>>  GJ Tube11-20>>>  11/19 - EGD: stomach was filled with solid brown, feculent appearing material. The gastric anatomy was very abnormal, distorted and twisted  11/19 - CT chest/abd/pelvis:  11/20 - Exp lap (Rosenbower): Large hiatal hernia, gastric outlet obstruction, gastric volvulus, bezoar, esophageal perforation. PROCEDURE: lysis of adhesions, reduction of hiatal hernia, repair of esophageal perforation, Dor fundoplication, evacuation of gastric bezoar, placement of the gastrostomy-jejunostomy tube  11/22 - Extubated 11/21 Deconditioned. In pain : needing fent q2h but able to sit in chair.  11/29 CXR Continued clearing of the lung fields bilaterally suggesting clearing of pulmonary edema and/or underlying effusions. Persistent moderate-sized pleural effusions. Antibiotics:  Ancef 12/01 --> 12/3 Keflex 12/3 >>12/4   HPI/Subjective:  States she is eating well, but she is actually eating very little.  Back to back diarrhea overnight.    Objective: Filed Vitals:   08/12/13 1338 08/12/13 2004 08/13/13 0450 08/13/13 1437  BP: 102/63 107/75 115/73 115/65  Pulse: 109 105 108 108  Temp: 98.3 F (36.8 C) 98.6 F (37 C) 98.1 F (36.7 C) 98.1 F (36.7 C)  TempSrc: Oral  Oral Axillary Oral  Resp: 20 18 20 20   Height:      Weight:   46.6 kg (102 lb 11.8 oz)   SpO2: 100% 96% 94% 98%    Intake/Output  Summary (Last 24 hours) at 08/13/13 1744 Last data filed at 08/13/13 1331  Gross per 24 hour  Intake    417 ml  Output   1075 ml  Net   -658 ml   Filed Weights   08/11/13 0350 08/12/13 0444 08/13/13 0450  Weight: 45.3 kg (99 lb 13.9 oz) 45.3 kg (99 lb 13.9 oz) 46.6 kg (102 lb 11.8 oz)    Exam:   General:  Thin CF, No acute distress  HEENT:  NCAT, MMM  Cardiovascular:  Tachycardic, regular rhythm, nl S1, S2 no mrg, 2+ pulses, warm extremities  Respiratory:  Diminished at bilateral bases, no increased WOB  Abdomen:   NABS, soft, NT/ND, abdominal incision with staples intact superiorly, serosanguinous drainage of wet to dry dressing packed inferiorly (not removed for exam).  Pinkness resolving around incision.  Drain removed.  MSK:   Normal tone and bulk, no LEE  Neuro:  Grossly intact  Data Reviewed: Basic Metabolic Panel:  Recent Labs Lab 08/07/13 0550 08/08/13 0440 08/09/13 0540 08/11/13 0456 08/12/13 0415  NA 139 138 139 139 138  K 4.1 4.1 4.1 3.9 3.7  CL 106 104 105 104 102  CO2 27 26 26 29 30   GLUCOSE 119* 119* 102* 113* 104*  BUN 15 12 13 13 12   CREATININE 0.46* 0.43* 0.49* 0.50 0.52  CALCIUM 8.1* 8.3* 8.2* 8.4 8.5   Liver Function Tests: No results found for this basename: AST, ALT, ALKPHOS, BILITOT, PROT, ALBUMIN,  in the last 168 hours No results found for this basename: LIPASE, AMYLASE,  in the last 168 hours No results found for this basename: AMMONIA,  in the last 168 hours CBC:  Recent Labs Lab 08/07/13 1200 08/08/13 0440 08/09/13 0540 08/11/13 0456 08/12/13 0415  WBC 10.9* 10.3 10.1 9.3 9.3  HGB 9.0* 8.6* 8.3* 8.6* 8.7*  HCT 27.2* 26.2* 25.2* 26.2* 27.2*  MCV 88.0 88.2 88.4 88.8 89.5  PLT 388 388 396 461* 443*   Cardiac Enzymes: No results found for this basename: CKTOTAL, CKMB, CKMBINDEX, TROPONINI,  in the last 168 hours BNP (last 3 results)  Recent Labs  08/03/13 0620  PROBNP 252.7*   CBG:  Recent Labs Lab 08/13/13 0007  08/13/13 0442 08/13/13 0723 08/13/13 1130 08/13/13 1643  GLUCAP 136* 111* 94 139* 112*    Recent Results (from the past 240 hour(s))  WOUND CULTURE     Status: None   Collection Time    08/10/13 12:35 PM      Result Value Range Status   Specimen Description ABDOMEN Abdominal surgical incision   Final   Special Requests NONE   Final   Gram Stain     Final   Value: NO WBC SEEN     NO SQUAMOUS EPITHELIAL CELLS SEEN     NO ORGANISMS SEEN     Performed at [facility]   Culture     Final   Value: MULTIPLE ORGANISMS PRESENT, NONE PREDOMINANT     Note: NO STAPHYLOCOCCUS AUREUS ISOLATED NO GROUP A STREP (S.PYOGENES) ISOLATED     Performed at [facility]   Report Status 08/13/2013 FINAL   Final  WOUND CULTURE     Status: None   Collection Time    08/11/13  2:07 PM  Result Value Range Status   Specimen Description WOUND ABDOMINAL   Final   Special Requests NONE   Final   Gram Stain     Final   Value: NO WBC SEEN     NO SQUAMOUS EPITHELIAL CELLS SEEN     NO ORGANISMS SEEN     Performed at [facility]   Culture     Final   Value: MULTIPLE ORGANISMS PRESENT, NONE PREDOMINANT     Note: NO GROUP A STREP (S.PYOGENES) ISOLATED NO STAPHYLOCOCCUS AUREUS ISOLATED     Performed at [facility]   Report Status 08/13/2013 FINAL   Final  CLOSTRIDIUM DIFFICILE BY PCR     Status: None   Collection Time    08/13/13 12:50 PM      Result Value Range Status   C difficile by pcr NEGATIVE  NEGATIVE Final   Comment: Performed at [facility]     Studies: No results found.  Scheduled Meds: . antiseptic oral rinse  15 mL Mouth Rinse QID  . carboxymethylcellulose  1 drop Both Eyes TID WC & HS  . CVS EYE LUBRICANT NIGHTTIME  1 drop Ophthalmic QHS  . feeding supplement (ENSURE COMPLETE)  237 mL Oral BID BM  . feeding supplement (JEVITY 1.2 CAL)  1,000 mL Per Tube Q24H  . feeding supplement (JEVITY 1.2 CAL)  237 mL Per Tube TID  . ferrous sulfate  325  mg Oral TID WC  . heparin subcutaneous  5,000 Units Subcutaneous Q8H  . lip balm  1 application Topical BID  . lipase/protease/amylase  2 capsule Oral TID WC  . loperamide  2 mg Oral QHS  . pantoprazole  40 mg Oral Q1200  . polycarbophil  625 mg Oral BID  . saccharomyces boulardii  250 mg Oral BID  . sodium chloride  10-40 mL Intracatheter Q12H  . Tafluprost  1 drop Ophthalmic QHS  . timolol  1 drop Both Eyes Custom   Continuous Infusions: . dextrose 5 % and 0.45 % NaCl with KCl 20 mEq/L 20 mL/hr (08/10/13 1500)  . sodium chloride 0.9 % 1,000 mL with potassium chloride 40 mEq infusion 50 mL/hr at 08/08/13 1715    Principal Problem:   Gastric outlet obstruction Active Problems:   Irritable bowel syndrome   GI bleed   Persistent recurrent vomiting   Nausea with vomiting   Acute respiratory failure with hypoxia   Protein calorie malnutrition   Esophageal perforation   Volvulus   Gastric bezoar   HH (hiatus hernia): RECURRENT   Hypokalemia   Hypomagnesemia   Bradycardia    Time spent: 30 min    Bali Lyn, [facility]  [facility] Pager [phone]. If 7PM-7AM, please contact night-coverage at [email or web address], password [facility] 08/13/2013, 5:44 PM  LOS: 16 days

## 2013-08-13 NOTE — Progress Notes (Signed)
Gave patient her bolus feeding around midnight. She seemed to be tolerating the feeding. About 10 mins after getting it she c/o that she was nauseated and felt like she was going to throw up. I medicated her with IV Zofran. She started having liquid loose brown stools. Pt passed gas and burped several times, stated that made her feel better.  Up to the bsc for stools. Will cont to monitor.

## 2013-08-13 NOTE — Progress Notes (Signed)
Patient refused her supplemental bolus feed of Jevity stating, "it makes me have diarrhea".  Patient stated she would resume the 10 pm scheduled feeding. Will continue to monitor patient and offer PO intake to help patient consume caloric needs. J.Netta Fodge,RN

## 2013-08-13 NOTE — Progress Notes (Signed)
Probable discharge tomorrow. Notified [facility] of same.  Jenayah Antu C. Yani Coventry MSW, LCSW [phone]

## 2013-08-14 MED ORDER — SACCHAROMYCES BOULARDII 250 MG PO CAPS: 250.0000 mg | ORAL_CAPSULE | Freq: Two times a day (BID) | ORAL | Status: DC

## 2013-08-14 MED ORDER — LOPERAMIDE HCL 2 MG PO CAPS
4.0000 mg | ORAL_CAPSULE | Freq: Every day | ORAL | Status: DC
  Administered 2013-08-14: 23:00:00 4 mg via ORAL
  Filled 2013-08-14 (×2): qty 2

## 2013-08-14 MED ORDER — LOPERAMIDE HCL 2 MG PO CAPS
2.0000 mg | ORAL_CAPSULE | Freq: Two times a day (BID) | ORAL | Status: DC
Start: 2013-08-14 — End: 2013-08-14
  Administered 2013-08-14: 13:00:00 2 mg via ORAL

## 2013-08-14 MED ORDER — FERROUS SULFATE 325 (65 FE) MG PO TABS: 325.0000 mg | ORAL_TABLET | Freq: Three times a day (TID) | ORAL | Status: DC

## 2013-08-14 MED ORDER — LOPERAMIDE HCL 2 MG PO CAPS
2.0000 mg | ORAL_CAPSULE | Freq: Three times a day (TID) | ORAL | Status: DC
  Administered 2013-08-14 – 2013-08-15 (×3): 2 mg via ORAL
  Filled 2013-08-14 (×5): qty 1

## 2013-08-14 MED ORDER — VITAL AF 1.2 CAL PO LIQD: 120.0000 mL | Freq: Four times a day (QID) | ORAL | Status: DC

## 2013-08-14 MED ORDER — LOPERAMIDE HCL 2 MG PO CAPS: 2.0000 mg | ORAL_CAPSULE | Freq: Three times a day (TID) | ORAL | Status: DC

## 2013-08-14 MED ORDER — LOPERAMIDE HCL 2 MG PO CAPS: 4.0000 mg | ORAL_CAPSULE | Freq: Every day | ORAL | Status: DC

## 2013-08-14 MED ORDER — VITAL AF 1.2 CAL PO LIQD
120.0000 mL | Freq: Four times a day (QID) | ORAL | Status: DC
  Administered 2013-08-14 (×3): 120 mL
  Filled 2013-08-14 (×6): qty 237

## 2013-08-14 MED ORDER — OXYCODONE HCL 5 MG PO TABS: 5.0000 mg | ORAL_TABLET | ORAL | Status: DC | PRN

## 2013-08-14 MED ORDER — ACETAMINOPHEN 325 MG PO TABS: 325.0000 mg | ORAL_TABLET | Freq: Four times a day (QID) | ORAL | Status: DC | PRN

## 2013-08-14 MED ORDER — ONDANSETRON HCL 4 MG PO TABS: 4.0000 mg | ORAL_TABLET | Freq: Four times a day (QID) | ORAL | Status: DC | PRN

## 2013-08-14 NOTE — Discharge Summary (Signed)
15 Days Post-Op   PROGRESS NOTE Subjective: She looks fine, major issue is her diarrhea, which is significant. Objective: Vital signs in last 24 hours: Temp:  [98 F (36.7 C)-98.2 F (36.8 C)] 98 F (36.7 C) (12/05 0452) Pulse Rate:  [108-118] 110 (12/05 0452) Resp:  [18-20] 18 (12/05 0452) BP: (110-115)/(65-75) 110/67 mmHg (12/05 0452) SpO2:  [96 %-98 %] 96 % (12/05 0452) Weight:  [46.3 kg (102 lb 1.2 oz)] 46.3 kg (102 lb 1.2 oz) (12/05 0452) Last BM Date: 08/13/13 She had 8 stools yesterday. Intake/Output from previous day: 12/04 0701 - 12/05 0700 In: 1041.8 [P.O.:210; I.V.:227.3; NG/GT:524.5] Out: 1100 [Urine:1100] Intake/Output this shift: Total I/O In: 270 [P.O.:240; Other:30] Out: 200 [Urine:200]  General appearance: alert, cooperative and no distress Resp: clear to auscultation bilaterally GI: soft, non-tender; bowel sounds normal; no masses,  no organomegaly and open wound site looks very good no further erythema.  Packed site looks very good.  Lab Results:   Recent Labs  08/12/13 0415  WBC 9.3  HGB 8.7*  HCT 27.2*  PLT 443*    BMET  Recent Labs  08/12/13 0415  NA 138  K 3.7  CL 102  CO2 30  GLUCOSE 104*  BUN 12  CREATININE 0.52  CALCIUM 8.5   PT/INR No results found for this basename: LABPROT, INR,  in the last 72 hours  No results found for this basename: AST, ALT, ALKPHOS, BILITOT, PROT, ALBUMIN,  in the last 168 hours   Lipase     Component Value Date/Time   LIPASE 16 07/28/2013 0900     Studies/Results: No results found.  Medications: . antiseptic oral rinse  15 mL Mouth Rinse QID  . carboxymethylcellulose  1 drop Both Eyes TID WC & HS  . CVS EYE LUBRICANT NIGHTTIME  1 drop Ophthalmic QHS  . feeding supplement (VITAL AF 1.2 CAL)  120 mL Per Tube QID  . ferrous sulfate  325 mg Oral TID WC  . heparin subcutaneous  5,000 Units Subcutaneous Q8H  . lip balm  1 application Topical BID  . lipase/protease/amylase  2 capsule Oral TID WC   . loperamide  2 mg Oral BID  . loperamide  4 mg Oral QHS  . pantoprazole  40 mg Oral Q1200  . polycarbophil  625 mg Oral BID  . saccharomyces boulardii  250 mg Oral BID  . sodium chloride  10-40 mL Intracatheter Q12H  . Tafluprost  1 drop Ophthalmic QHS  . 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  Malnutrition (prealbumin 11.1)  Diarrhea   Plan:  Dietician has seen her and changed the Tube feedings and supplements completely. We are going to keep her here today and see how she does with the new diet supplementation.  I will recheck her labs in Am  also.  If she is doing well and they will take her, she may be able to go to SNF tomorrow.  We will need to do UPDATE the  medicines, I have done  the discharge summary.  If she isn't ready tomorrow or SNF will not take her we can aim for Monday. He drains are out and staples are out.  She has a PICC line and that will have to be removed at discharge.     LOS: 17 days    Dereke Neumann 08/14/2013

## 2013-08-14 NOTE — Progress Notes (Addendum)
NUTRITION FOLLOW UP  Intervention:   Discontinue Ensure Supplements Change TF regimen to bolus only: half can of Vital AF 1.2 QID to provide 568 kcal, 36 grams protein, and 384 ml of H2O Provide Carnation Instant Breakfast PO once daily, provides approximately 220 kcal and 13 grams of protein Discussed diet tips to help prevent diarrhea Continue Florastor RD will continue to monitor  Nutrition Dx: Inadequate oral intake related to hiatal hernia as evidenced by reported intake less than estimated needs; ongoing  Goal:   Pt to meet >/= 90% of their estimated nutrition needs; being met  Monitor:   Weight; 4 lbs above usual body weight and 8 lb above admission weight TF rate/tolerance; not tolerating bolus feeds due to diarrhea; tolerating continuous TF Labs; low hemoglobin, glucose ranging 84 to 139 mg/dL  Assessment:   72 year old female dmitted from home with c/o nausea, vomiting, and coffee ground emesis that stated Saturday PTA. Hx of large hiatal hernia, GI bleeding, diverticulosis, GERD, IBS, HTN, and nissen fundoplication. On Creon with meals, vitamin D, and Benefiber at home. Found to have upper GI bleed. EGD showed the stomach was filled with solid brown, feculent appearing material; gastric anatomy was very abnormal, distorted and twisted. Pt underwent exploratory laparotomy, lysis of adhesions (2 hours), reduction of hiatal hernia, repair of esophageal perforation, Dor fundoplication, evacuation of gastric bezoar, placement of the gastrostomy-jejunostomy tube 11/20.   Pt was extubated 11/22 Tube feeds were stopped 11/13- J-tube was out of place TPN started 11/20 and d/c'd 11/30  Jevity 1.2 running continuously at 25 ml/hr; pt also receiving bolus TF's- 1 Can of Jevity 1.2 TID. Pt eating a few bites PO at each meal. Pt reports that when she receives bolus of Jevity 1.2 she feels very full and she has diarrhea right away. Pt denies any bloating or pain with bolus feeds. Pt states  she has not been drinking Ensure but, she would like to go back to drinking [employer]. Pt's weight is trending up.  Discussed diet tips for preventing diarrhea and encouraged pt to avoid too much sugary food (pt has been drinking lemonade and sweet tea and eating cookies and ice cream which may be contributing to diarrhea).  RD consulted for TF management. Will discontinue continuous TF and switch TF formula to Vital AF 1.2 and decrease bolus amount to half a can (4 oz) at a time.    Height: Ht Readings from Last 1 Encounters:  08/03/13 4\' 9"  (1.448 m)    Weight Status:   Wt Readings from Last 1 Encounters:  08/14/13 102 lb 1.2 oz (46.3 kg)    Re-estimated needs:  Kcal: 1250-1450 Protein: 60-70 g Fluid: 1.3-1.5 L/day  Skin: incision on abdomen  Diet Order: Dysphagia   Intake/Output Summary (Last 24 hours) at 08/14/13 1027 Last data filed at 08/14/13 0842  Gross per 24 hour  Intake 1251.83 ml  Output   1000 ml  Net 251.83 ml    Last BM: 12/4   Labs:   Recent Labs Lab 08/09/13 0540 08/11/13 0456 08/12/13 0415  NA 139 139 138  K 4.1 3.9 3.7  CL 105 104 102  CO2 26 29 30   BUN 13 13 12   CREATININE 0.49* 0.50 0.52  CALCIUM 8.2* 8.4 8.5  GLUCOSE 102* 113* 104*    CBG (last 3)   Recent Labs  08/13/13 0723 08/13/13 1130 08/13/13 1643  GLUCAP 94 139* 112*    Scheduled Meds: . antiseptic oral rinse  15 mL Mouth Rinse QID  . carboxymethylcellulose  1 drop Both Eyes TID WC & HS  . CVS EYE LUBRICANT NIGHTTIME  1 drop Ophthalmic QHS  . feeding supplement (ENSURE COMPLETE)  237 mL Oral BID BM  . feeding supplement (JEVITY 1.2 CAL)  1,000 mL Per Tube Q24H  . feeding supplement (JEVITY 1.2 CAL)  237 mL Per Tube TID  . ferrous sulfate  325 mg Oral TID WC  . heparin subcutaneous  5,000 Units Subcutaneous Q8H  . lip balm  1 application Topical BID  . lipase/protease/amylase  2 capsule Oral TID WC  . loperamide  2 mg Oral QHS  . pantoprazole  40  mg Oral Q1200  . polycarbophil  625 mg Oral BID  . saccharomyces boulardii  250 mg Oral BID  . sodium chloride  10-40 mL Intracatheter Q12H  . Tafluprost  1 drop Ophthalmic QHS  . timolol  1 drop Both Eyes Custom    Continuous Infusions: . dextrose 5 % and 0.45 % NaCl with KCl 20 mEq/L 20 mL/hr at 08/13/13 2355  . sodium chloride 0.9 % 1,000 mL with potassium chloride 40 mEq infusion Stopped (08/13/13 2354)    Ian Malkin RD, LDN Inpatient Clinical Dietitian Pager: [phone] After Hours Pager: [phone]

## 2013-08-14 NOTE — Progress Notes (Signed)
PT Cancellation Note  Patient Details Name: [name] MRN: [id number] DOB: 11-20-1940   Cancelled Treatment:    Reason Eval/Treat Not Completed: Fatigue/lethargy limiting ability to participate Pt with diarrhea today and feels too weak for activity.  Pt may d/c to SNF tomorrow.   Mabell Esguerra,KATHrine E 08/14/2013, 2:02 PM Zenovia Jarred, PT, DPT 08/14/2013 Pager: [phone]

## 2013-08-14 NOTE — Progress Notes (Signed)
[facility] PROGRESS NOTE  [name] ZOX:[id number] DOB: 1940/10/16 DOA: 07/28/2013 PCP: Hollice Espy, MD  Assessment/Plan  Recurrent recurrent large hiatal hernia/gastric outlet obstruction/esophageal perforation/gastric bezoar  - Status post laparotomy 07/30/2013.   - continue tube feeds to supplement oral intake - encouraged oral intake  Diarrhea, likely due to motility problems postoperatively.  Patient normally takes imodium 3-4 times per day for her IBS at home anyway and now has superimposed recent gastric surgery and high osmolar tube feeds to worsen problem. -  C. difficile: Negative -  Schedule imodium TID PLUS increase evening imodium to 4mg  -  Agree with tube feeding changes per nutrition   Cellulitis around surgical site, around staple pinkness almost completely resolved.  Completed  -  s/p 4 days of antibiotics  Bradycardia/Tachycardia syndrome.  Evaluated by cardiology and due to pauses and episodes of bradycardia is not a candidate for rate control medications despite tachycardia.    Pulmonary edema and bilateral pleural effusions may have been due to volume resuscitation in setting of grade 1 DD, resolving -  Appreciate cardiology assistance   Hypokalemia./Hypomagnesemia, secondary to GI losses.  resolved with IV supplementations  Acute respiratory failure, intubated to protect airway due to intractable nausea and vomiting at admission -  Extubated 07/31/2013   Acute encephalopathy was secondary to HA delirium and resolved  Hypertension BP stable on no BP medications  Glaucoma, stable.  Continue eyedrops.   Anemia of chronic disease stable hemoglobin.  No obvious blood loss  Leukocytosis, resolved.  Likely related to stress from initial presentation.  CXR negative for infection.    Severe malnutrition.  Continue to encourage oral intake and continue tube feeds as above.  Hyperglycemia without diabetes likely due to acute distress, now  resolved  Diet:  Dysphagia 3 and tube feeds Access:  PIV IVF:  TL PICC Proph:  heparin  Code Status: full Family Communication: patient alone Disposition Plan: To skilled nursing facility tomorrow as long as diarrhea is improving   Consultants:  PCCM  Gastroenterology: Dr. Christella Hartigan Procedures:  ETT 11/19 >>11/21  RUE PICC 11/19>>>  GJ Tube11-20>>>  11/19 - EGD: stomach was filled with solid brown, feculent appearing material. The gastric anatomy was very abnormal, distorted and twisted  11/19 - CT chest/abd/pelvis:  11/20 - Exp lap (Rosenbower): Large hiatal hernia, gastric outlet obstruction, gastric volvulus, bezoar, esophageal perforation. PROCEDURE: lysis of adhesions, reduction of hiatal hernia, repair of esophageal perforation, Dor fundoplication, evacuation of gastric bezoar, placement of the gastrostomy-jejunostomy tube  11/22 - Extubated 11/21 Deconditioned. In pain : needing fent q2h but able to sit in chair.  11/29 CXR Continued clearing of the lung fields bilaterally suggesting clearing of pulmonary edema and/or underlying effusions. Persistent moderate-sized pleural effusions. Antibiotics:  Ancef 12/01 --> 12/3 Keflex 12/3 >>12/4   HPI/Subjective:  Continues to have persistent diarrhea, however she is gaining weight. She tells me that she normally takes Imodium up to 4 times per day at baseline because of her diarrhea predominant IBS.  She is eating more today according to the flow sheet.    Objective: Filed Vitals:   08/13/13 1437 08/13/13 2239 08/14/13 0452 08/14/13 1342  BP: 115/65 112/75 110/67 112/64  Pulse: 108 118 110 98  Temp: 98.1 F (36.7 C) 98.2 F (36.8 C) 98 F (36.7 C) 98 F (36.7 C)  TempSrc: Oral Oral Oral Oral  Resp: 20 20 18 18   Height:      Weight:   46.3 kg (102 lb 1.2 oz)  SpO2: 98% 97% 96% 96%    Intake/Output Summary (Last 24 hours) at 08/14/13 1417 Last data filed at 08/14/13 1341  Gross per 24 hour  Intake 1371.83 ml  Output    1000 ml  Net 371.83 ml   Filed Weights   08/12/13 0444 08/13/13 0450 08/14/13 0452  Weight: 45.3 kg (99 lb 13.9 oz) 46.6 kg (102 lb 11.8 oz) 46.3 kg (102 lb 1.2 oz)    Exam:   General:  Thin CF, No acute distress  HEENT:  NCAT, MMM  Cardiovascular:  Tachycardic, regular rhythm, nl S1, S2 no mrg, 2+ pulses, warm extremities  Respiratory:  Diminished at bilateral bases, no increased WOB  Abdomen:   NABS, soft, NT/ND, abdominal incision with staples intact superiorly, serosanguinous drainage of wet to dry dressing packed inferiorly (not removed for exam).  Pinkness almost completely resolved around incision.  Drain removed.  MSK:   Normal tone and bulk, no LEE  Neuro:  Grossly intact  Data Reviewed: Basic Metabolic Panel:  Recent Labs Lab 08/08/13 0440 08/09/13 0540 08/11/13 0456 08/12/13 0415  NA 138 139 139 138  K 4.1 4.1 3.9 3.7  CL 104 105 104 102  CO2 26 26 29 30   GLUCOSE 119* 102* 113* 104*  BUN 12 13 13 12   CREATININE 0.43* 0.49* 0.50 0.52  CALCIUM 8.3* 8.2* 8.4 8.5   Liver Function Tests: No results found for this basename: AST, ALT, ALKPHOS, BILITOT, PROT, ALBUMIN,  in the last 168 hours No results found for this basename: LIPASE, AMYLASE,  in the last 168 hours No results found for this basename: AMMONIA,  in the last 168 hours CBC:  Recent Labs Lab 08/08/13 0440 08/09/13 0540 08/11/13 0456 08/12/13 0415  WBC 10.3 10.1 9.3 9.3  HGB 8.6* 8.3* 8.6* 8.7*  HCT 26.2* 25.2* 26.2* 27.2*  MCV 88.2 88.4 88.8 89.5  PLT 388 396 461* 443*   Cardiac Enzymes: No results found for this basename: CKTOTAL, CKMB, CKMBINDEX, TROPONINI,  in the last 168 hours BNP (last 3 results)  Recent Labs  08/03/13 0620  PROBNP 252.7*   CBG:  Recent Labs Lab 08/13/13 0007 08/13/13 0442 08/13/13 0723 08/13/13 1130 08/13/13 1643  GLUCAP 136* 111* 94 139* 112*    Recent Results (from the past 240 hour(s))  WOUND CULTURE     Status: None   Collection Time     08/10/13 12:35 PM      Result Value Range Status   Specimen Description ABDOMEN Abdominal surgical incision   Final   Special Requests NONE   Final   Gram Stain     Final   Value: NO WBC SEEN     NO SQUAMOUS EPITHELIAL CELLS SEEN     NO ORGANISMS SEEN     Performed at [facility]   Culture     Final   Value: MULTIPLE ORGANISMS PRESENT, NONE PREDOMINANT     Note: NO STAPHYLOCOCCUS AUREUS ISOLATED NO GROUP A STREP (S.PYOGENES) ISOLATED     Performed at [facility]   Report Status 08/13/2013 FINAL   Final  WOUND CULTURE     Status: None   Collection Time    08/11/13  2:07 PM      Result Value Range Status   Specimen Description WOUND ABDOMINAL   Final   Special Requests NONE   Final   Gram Stain     Final   Value: NO WBC SEEN     NO SQUAMOUS EPITHELIAL CELLS  SEEN     NO ORGANISMS SEEN     Performed at [facility]   Culture     Final   Value: MULTIPLE ORGANISMS PRESENT, NONE PREDOMINANT     Note: NO GROUP A STREP (S.PYOGENES) ISOLATED NO STAPHYLOCOCCUS AUREUS ISOLATED     Performed at [facility]   Report Status 08/13/2013 FINAL   Final  CLOSTRIDIUM DIFFICILE BY PCR     Status: None   Collection Time    08/13/13 12:50 PM      Result Value Range Status   C difficile by pcr NEGATIVE  NEGATIVE Final   Comment: Performed at [facility]     Studies: No results found.  Scheduled Meds: . antiseptic oral rinse  15 mL Mouth Rinse QID  . carboxymethylcellulose  1 drop Both Eyes TID WC & HS  . CVS EYE LUBRICANT NIGHTTIME  1 drop Ophthalmic QHS  . feeding supplement (VITAL AF 1.2 CAL)  120 mL Per Tube QID  . ferrous sulfate  325 mg Oral TID WC  . heparin subcutaneous  5,000 Units Subcutaneous Q8H  . lip balm  1 application Topical BID  . lipase/protease/amylase  2 capsule Oral TID WC  . loperamide  2 mg Oral TID  . loperamide  4 mg Oral QHS  . pantoprazole  40 mg Oral Q1200  . polycarbophil  625 mg Oral BID  . saccharomyces boulardii   250 mg Oral BID  . sodium chloride  10-40 mL Intracatheter Q12H  . Tafluprost  1 drop Ophthalmic QHS  . timolol  1 drop Both Eyes Custom   Continuous Infusions: . dextrose 5 % and 0.45 % NaCl with KCl 20 mEq/L 20 mL/hr at 08/13/13 2355  . sodium chloride 0.9 % 1,000 mL with potassium chloride 40 mEq infusion Stopped (08/13/13 2354)    Principal Problem:   Gastric outlet obstruction Active Problems:   Irritable bowel syndrome   GI bleed   Persistent recurrent vomiting   Nausea with vomiting   Acute respiratory failure with hypoxia   Protein calorie malnutrition   Esophageal perforation   Volvulus   Gastric bezoar   HH (hiatus hernia): RECURRENT   Hypokalemia   Hypomagnesemia   Bradycardia    Time spent: 30 min    Conner Neiss, [facility]  [facility] Pager [phone]. If 7PM-7AM, please contact night-coverage at [email or web address], password [facility] 08/14/2013, 2:17 PM  LOS: 17 days

## 2013-08-14 NOTE — Progress Notes (Signed)
Immediately following 2200 bolus feed, patient began having diarrhea. Patient sat on toilet for about 20 minutes and continued to have loose stools. About 2 hours later, patient had loose stools again and was incontinent. Patient seemed to be very frustrated with her enteral nutrition routine, stated, "I don't see the point of getting this tube feed if all the nutrients are just coming right back out." Patient remains connected to tube feed at 25 ml/hr. Will continue to monitor patient.

## 2013-08-14 NOTE — Progress Notes (Signed)
Patient may be ready for discharge tomorrow. Notified [facility]. Sent DC summary. CSW called patient's son in law. Explained probable discharge tomorrow. He stated that he was here earlier and that he spoke with both doctors. He is agreeable to transfer to [facility] and states that they will be at hospital in AM.  Karry Barrilleaux C. Havilah Topor MSW, LCSW [phone]

## 2013-08-14 NOTE — Progress Notes (Signed)
Follow up. Patient was sleepy but talkative. She was looking forward to leaving the hospital for [facility]. She believes physical therapy there will help her regain some of her strength. Facilitated her drawing on her inner resources for hope in the face of her present weakness. Listening; presence; prayer. She has good church support.

## 2013-08-15 LAB — CBC
HCT: 28.1 % — ABNORMAL LOW (ref 36.0–46.0)
Hemoglobin: 9 g/dL — ABNORMAL LOW (ref 12.0–15.0)
MCH: 28.8 pg (ref 26.0–34.0)
MCHC: 32 g/dL (ref 30.0–36.0)
MCV: 89.8 fL (ref 78.0–100.0)
Platelets: 417 10*3/uL — ABNORMAL HIGH (ref 150–400)
RDW: 14.6 % (ref 11.5–15.5)

## 2013-08-15 LAB — BASIC METABOLIC PANEL
BUN: 12 mg/dL (ref 6–23)
Calcium: 8.9 mg/dL (ref 8.4–10.5)
Creatinine, Ser: 0.47 mg/dL — ABNORMAL LOW (ref 0.50–1.10)
GFR calc Af Amer: >90 mL/min (ref >90)
GFR calc non Af Amer: >90 mL/min (ref >90)
Glucose, Bld: 91 mg/dL (ref 70–99)
Potassium: 3.9 mEq/L (ref 3.5–5.1)

## 2013-08-15 NOTE — Progress Notes (Signed)
D/C RUA PICC per order.  Pt and dtr verbalizes understanding of site care, dressing care and interventions for infection or bleeding via teachback method.  No ADN at this time.  SIte without bleeding or signs of infection at this time.  Covered with vaseline guaze and dry 4x4.

## 2013-08-15 NOTE — Discharge Summary (Signed)
Physician Discharge Summary  Patient ID: [name] MRN: [id number] DOB/AGE: [birth year] 72 y.o.  Admit date: 07/28/2013 Discharge date: 08/15/2013  Admission Diagnoses:  Gastric obstruction  Discharge Diagnoses:  Same post hiatus hernia repair  Principal Problem:   Gastric outlet obstruction Active Problems:   Irritable bowel syndrome   GI bleed   Persistent recurrent vomiting   Nausea with vomiting   Acute respiratory failure with hypoxia   Protein calorie malnutrition   Esophageal perforation   Volvulus   Gastric bezoar   HH (hiatus hernia): RECURRENT   Hypokalemia   Hypomagnesemia   Bradycardia   Surgery:  Hiatus hernia repair and gastropexy  Discharged Condition: improved  Hospital Course:   Had surgery.  Feeding tube placed.  Slow convalescence with nutritional support.  Ready for discharge.    Consults: hospitalists  Significant Diagnostic Studies: many; CT scan, UGI    Discharge Exam: Blood pressure 107/61, pulse 112, temperature 97.7 F (36.5 C), temperature source Oral, resp. rate 19, height 4\' 9"  (1.448 m), weight 95 lb 1.6 oz (43.137 kg), SpO2 96.00%. Tubes secured;  JP out.  Bolus tube feedings provided.  Would open in lower part and requiring packing daily.   Disposition: 01-Home or Self Care  Discharge Orders   Future Appointments Provider Department Dept Phone   08/28/2013 4:00 PM Iva Boop, MD [facility] [phone]   09/16/2013 11:00 AM [facility] [phone]   Future Orders Complete By Expires   Care order/instruction  As directed    Scheduling Instructions:     Bid dressing changes to open wound.  Pack with wet to dry 4 x 4, or kerlix to base.  Use  Sterile Normal saline as the solution       Medication List         acetaminophen 325 MG tablet  Commonly known as:  TYLENOL  Take 1-2 tablets (325-650 mg total) by mouth every 6 (six) hours as needed for fever, headache, mild  pain or moderate pain.     alosetron 1 MG tablet  Commonly known as:  LOTRONEX  Take 1 mg by mouth daily with breakfast.     amLODipine-benazepril 5-20 MG per capsule  Commonly known as:  LOTREL  Take 1 capsule by mouth daily after breakfast. Patient took at home prior to arrival to Short Stay     aspirin 81 MG tablet  Take 81 mg by mouth daily after breakfast.     BENEFIBER Tabs  Take 2 tablets by mouth 2 (two) times daily.     CENTRUM ULTRA WOMENS Tabs  Take 1 tablet by mouth daily.     CREON 24000 UNITS Cpep  Generic drug:  Pancrelipase (Lip-Prot-Amyl)  Take 3 capsules by mouth 3 (three) times daily with meals. Take 3 caps with each meal and 3 caps with each snack     feeding supplement (VITAL AF 1.2 CAL) Liqd  Place 120 mLs into feeding tube 4 (four) times daily.     ferrous sulfate 325 (65 FE) MG tablet  Take 1 tablet (325 mg total) by mouth 3 (three) times daily with meals.     ibandronate 150 MG tablet  Commonly known as:  BONIVA  Take 150 mg by mouth every 3 (three) months. Take 1 tablet daily for one week then off one week     loperamide 2 MG capsule  Commonly known as:  IMODIUM  Take 2 capsules (4 mg total) by mouth  at bedtime.     loperamide 2 MG capsule  Commonly known as:  IMODIUM  Take 1 capsule (2 mg total) by mouth 3 (three) times daily.     omeprazole 20 MG capsule  Commonly known as:  PRILOSEC  Take 20 mg by mouth daily before breakfast.     ondansetron 4 MG tablet  Commonly known as:  ZOFRAN  Take 1 tablet (4 mg total) by mouth every 6 (six) hours as needed for nausea or vomiting.     oxyCODONE 5 MG immediate release tablet  Commonly known as:  Oxy IR/ROXICODONE  Take 1-2 tablets (5-10 mg total) by mouth every 4 (four) hours as needed for moderate pain, severe pain or breakthrough pain.     REFRESH LACRI-LUBE Oint  Apply 1 drop to eye 4 (four) times daily.     saccharomyces boulardii 250 MG capsule  Commonly known as:  FLORASTOR  Take 1  capsule (250 mg total) by mouth 2 (two) times daily.     timolol 0.5 % ophthalmic solution  Commonly known as:  BETIMOL  Place 1 drop into both eyes 2 (two) times daily.     Vitamin D (Ergocalciferol) 50000 UNITS Caps capsule  Commonly known as:  DRISDOL  Take 50,000 Units by mouth every 14 (fourteen) days.     ZIOPTAN 0.0015 % Soln  Generic drug:  Tafluprost  Apply 1 drop to eye at bedtime.           Follow-up Information   Follow up with ROSENBOWER,TODD J, MD. Schedule an appointment as soon as possible for a visit in 2 weeks. (10 - 14 day from discharge.  Sooner if you have an issue.)    Specialty:  General Surgery   Contact information:   [address] [place] [address] [phone]       Schedule an appointment as soon as possible for a visit with Hollice Espy, MD. (Before discharge from [facility].)    Specialty:  Family Medicine   Contact information:   [address] [place] [address] [phone]       Follow up with Adolph Pollack, MD In 4 weeks.   Specialty:  General Surgery   Contact information:   [address] [place] [address] [phone]       Signed: Valarie Merino 08/15/2013, 10:00 AM

## 2013-08-15 NOTE — Progress Notes (Signed)
[facility] PROGRESS NOTE  [name] ZOX:[id number] DOB: 03/03/1941 DOA: 07/28/2013 PCP: Hollice Espy, MD   Patient okay for discharge to SNF today.  Please make sure discharge summary reflects new tube feeds and imodium.   Assessment/Plan  Recurrent recurrent large hiatal hernia/gastric outlet obstruction/esophageal perforation/gastric bezoar  - Status post laparotomy 07/30/2013.   - continue tube feeds to supplement oral intake  Diarrhea, due to IBS, recent GI surgery, and osmotic diarrhea from tube feeds -  C. difficile: Negative -  Continue imodium -  Continue current tube feeds   Cellulitis around surgical site, around staple pinkness almost completely resolved.  Completed 4 days of antibiotics.  Bradycardia/Tachycardia syndrome.  Evaluated by cardiology and due to pauses and episodes of bradycardia is not a candidate for rate control medications despite tachycardia.    Pulmonary edema and bilateral pleural effusions may have been due to volume resuscitation in setting of grade 1 DD, resolving  Hypokalemia./Hypomagnesemia, secondary to GI losses.  resolved with IV supplementations  Acute respiratory failure, intubated to protect airway due to intractable nausea and vomiting at admission.  Extubated 07/31/2013   Acute encephalopathy was secondary to HA delirium and resolved  Hypertension BP stable on no BP medications  Glaucoma, stable.  Continue eyedrops.   Anemia of chronic disease stable hemoglobin.  No obvious blood loss  Leukocytosis, resolved.  Likely related to stress from initial presentation.  CXR negative for infection.    Severe malnutrition.  Continue to encourage oral intake and continue tube feeds as above.  Hyperglycemia without diabetes likely due to acute distress, now resolved  Diet:  Dysphagia 3 and tube feeds Access:  PIV IVF:  TL PICC Proph:  heparin  Code Status: full Family Communication: patient alone Disposition Plan: To  skilled nursing facility today if bed available   Consultants:  PCCM  Gastroenterology: Dr. Christella Hartigan Procedures:  ETT 11/19 >>11/21  RUE PICC 11/19>>>  GJ Tube11-20>>>  11/19 - EGD: stomach was filled with solid brown, feculent appearing material. The gastric anatomy was very abnormal, distorted and twisted  11/19 - CT chest/abd/pelvis:  11/20 - Exp lap (Rosenbower): Large hiatal hernia, gastric outlet obstruction, gastric volvulus, bezoar, esophageal perforation. PROCEDURE: lysis of adhesions, reduction of hiatal hernia, repair of esophageal perforation, Dor fundoplication, evacuation of gastric bezoar, placement of the gastrostomy-jejunostomy tube  11/22 - Extubated 11/21 Deconditioned. In pain : needing fent q2h but able to sit in chair.  11/29 CXR Continued clearing of the lung fields bilaterally suggesting clearing of pulmonary edema and/or underlying effusions. Persistent moderate-sized pleural effusions. Antibiotics:  Ancef 12/01 --> 12/3 Keflex 12/3 >>12/4   HPI/Subjective:  Diarrhea much better since yesterday.     Objective: Filed Vitals:   08/14/13 0452 08/14/13 1342 08/14/13 2115 08/15/13 0700  BP: 110/67 112/64 121/75 107/61  Pulse: 110 98 99 112  Temp: 98 F (36.7 C) 98 F (36.7 C) 97.9 F (36.6 C) 97.7 F (36.5 C)  TempSrc: Oral Oral Oral Oral  Resp: 18 18 20 20   Height:      Weight: 46.3 kg (102 lb 1.2 oz)   43.137 kg (95 lb 1.6 oz)  SpO2: 96% 96% 99% 96%    Intake/Output Summary (Last 24 hours) at 08/15/13 0844 Last data filed at 08/15/13 0631  Gross per 24 hour  Intake 990.33 ml  Output    400 ml  Net 590.33 ml   Filed Weights   08/13/13 0450 08/14/13 0452 08/15/13 0700  Weight: 46.6 kg (102 lb 11.8  oz) 46.3 kg (102 lb 1.2 oz) 43.137 kg (95 lb 1.6 oz)    Exam:   General:  Thin CF, No acute distress  HEENT:  NCAT, MMM  Cardiovascular:  Tachycardic, regular rhythm, nl S1, S2 no mrg, 2+ pulses, warm extremities  Respiratory:  Diminished at  bilateral bases, no increased WOB  Abdomen:   NABS, soft, NT/ND, abdominal incision with minimal surrounding pinkness, dressing clean.   MSK:   Normal tone and bulk, no LEE  Neuro:  Grossly intact  Data Reviewed: Basic Metabolic Panel:  Recent Labs Lab 08/09/13 0540 08/11/13 0456 08/12/13 0415 08/15/13 0510  NA 139 139 138 138  K 4.1 3.9 3.7 3.9  CL 105 104 102 101  CO2 26 29 30 31   GLUCOSE 102* 113* 104* 91  BUN 13 13 12 12   CREATININE 0.49* 0.50 0.52 0.47*  CALCIUM 8.2* 8.4 8.5 8.9  MG  --   --   --  2.0   Liver Function Tests: No results found for this basename: AST, ALT, ALKPHOS, BILITOT, PROT, ALBUMIN,  in the last 168 hours No results found for this basename: LIPASE, AMYLASE,  in the last 168 hours No results found for this basename: AMMONIA,  in the last 168 hours CBC:  Recent Labs Lab 08/09/13 0540 08/11/13 0456 08/12/13 0415 08/15/13 0510  WBC 10.1 9.3 9.3 6.2  HGB 8.3* 8.6* 8.7* 9.0*  HCT 25.2* 26.2* 27.2* 28.1*  MCV 88.4 88.8 89.5 89.8  PLT 396 461* 443* 417*   Cardiac Enzymes: No results found for this basename: CKTOTAL, CKMB, CKMBINDEX, TROPONINI,  in the last 168 hours BNP (last 3 results)  Recent Labs  08/03/13 0620  PROBNP 252.7*   CBG:  Recent Labs Lab 08/13/13 0007 08/13/13 0442 08/13/13 0723 08/13/13 1130 08/13/13 1643  GLUCAP 136* 111* 94 139* 112*    Recent Results (from the past 240 hour(s))  WOUND CULTURE     Status: None   Collection Time    08/10/13 12:35 PM      Result Value Range Status   Specimen Description ABDOMEN Abdominal surgical incision   Final   Special Requests NONE   Final   Gram Stain     Final   Value: NO WBC SEEN     NO SQUAMOUS EPITHELIAL CELLS SEEN     NO ORGANISMS SEEN     Performed at [facility]   Culture     Final   Value: MULTIPLE ORGANISMS PRESENT, NONE PREDOMINANT     Note: NO STAPHYLOCOCCUS AUREUS ISOLATED NO GROUP A STREP (S.PYOGENES) ISOLATED     Performed at [facility]   Report Status 08/13/2013 FINAL   Final  WOUND CULTURE     Status: None   Collection Time    08/11/13  2:07 PM      Result Value Range Status   Specimen Description WOUND ABDOMINAL   Final   Special Requests NONE   Final   Gram Stain     Final   Value: NO WBC SEEN     NO SQUAMOUS EPITHELIAL CELLS SEEN     NO ORGANISMS SEEN     Performed at [facility]   Culture     Final   Value: MULTIPLE ORGANISMS PRESENT, NONE PREDOMINANT     Note: NO GROUP A STREP (S.PYOGENES) ISOLATED NO STAPHYLOCOCCUS AUREUS ISOLATED     Performed at [facility]   Report Status 08/13/2013 FINAL   Final  CLOSTRIDIUM  DIFFICILE BY PCR     Status: None   Collection Time    08/13/13 12:50 PM      Result Value Range Status   C difficile by pcr NEGATIVE  NEGATIVE Final   Comment: Performed at [facility]     Studies: No results found.  Scheduled Meds: . antiseptic oral rinse  15 mL Mouth Rinse QID  . carboxymethylcellulose  1 drop Both Eyes TID WC & HS  . [employer] EYE LUBRICANT NIGHTTIME  1 drop Ophthalmic QHS  . feeding supplement (VITAL AF 1.2 CAL)  120 mL Per Tube QID  . ferrous sulfate  325 mg Oral TID WC  . heparin subcutaneous  5,000 Units Subcutaneous Q8H  . lip balm  1 application Topical BID  . lipase/protease/amylase  2 capsule Oral TID WC  . loperamide  2 mg Oral TID  . loperamide  4 mg Oral QHS  . pantoprazole  40 mg Oral Q1200  . polycarbophil  625 mg Oral BID  . saccharomyces boulardii  250 mg Oral BID  . sodium chloride  10-40 mL Intracatheter Q12H  . Tafluprost  1 drop Ophthalmic QHS  . timolol  1 drop Both Eyes Custom   Continuous Infusions: . dextrose 5 % and 0.45 % NaCl with KCl 20 mEq/L 20 mL/hr at 08/13/13 2355  . sodium chloride 0.9 % 1,000 mL with potassium chloride 40 mEq infusion Stopped (08/13/13 2354)    Principal Problem:   Gastric outlet obstruction Active Problems:   Irritable bowel syndrome   GI bleed   Persistent recurrent vomiting    Nausea with vomiting   Acute respiratory failure with hypoxia   Protein calorie malnutrition   Esophageal perforation   Volvulus   Gastric bezoar   HH (hiatus hernia): RECURRENT   Hypokalemia   Hypomagnesemia   Bradycardia    Time spent: 30 min    Alaura Schippers, [facility]  [facility] Pager [phone]. If 7PM-7AM, please contact night-coverage at [email or web address], password [facility] 08/15/2013, 8:44 AM  LOS: 18 days

## 2013-08-15 NOTE — Progress Notes (Signed)
Per MD, Pt ready for d/c.  Notified RN, Pt, family and facility.  Sent d/c summary.  Confirmed receipt of d/c summary.  Facility ready to receive Pt.  Arranged for transportation.  Amanda Anabelen Kaminsky, LCSWA Clinical Social Work [phone]   

## 2013-08-17 NOTE — Clinical Social Work Placement (Signed)
     Clinical Social Work Department CLINICAL SOCIAL WORK PLACEMENT NOTE 08/17/2013  Patient:  [name], [name]  Account Number:  [id number] Admit date:  07/28/2013  Clinical Social Worker:  Doroteo Glassman  Date/time:  08/09/2013 12:11 PM  Clinical Social Work is seeking post-discharge placement for this patient at the following level of care:   SKILLED NURSING   (*CSW will update this form in Epic as items are completed)   08/09/2013  Patient/family provided with [facility] Department of Clinical Social Works list of facilities offering this level of care within the geographic area requested by the patient (or if unable, by the patients family).  08/09/2013  Patient/family informed of their freedom to choose among providers that offer the needed level of care, that participate in Medicare, Medicaid or managed care program needed by the patient, have an available bed and are willing to accept the patient.  08/09/2013  Patient/family informed of [facility] ownership interest in [facility], as well as of the fact that they are under no obligation to receive care at this facility.  PASARR submitted to EDS on 08/09/2013 PASARR number received from EDS on 08/09/2013  FL2 transmitted to all facilities in geographic area requested by pt/family on  08/09/2013 FL2 transmitted to all facilities within larger geographic area on   Patient informed that his/her managed care company has contracts with or will negotiate with  certain facilities, including the following:     Patient/family informed of bed offers received:  08/10/2013 Patient chooses bed at [facility] Physician recommends and patient chooses bed at    Patient to be transferred to [facility] on  08/15/2013 Patient to be transferred to facility by ptar  The following physician request were entered in Epic:   Additional Comments:

## 2013-08-24 ENCOUNTER — Telehealth: Payer: Self-pay | Admitting: Internal Medicine

## 2013-08-24 NOTE — Telephone Encounter (Signed)
Patient scheduled the appt prior to her stomach surgery.  I have left a message for the patient's daughter that if her mom is not having any issues she can cancel the appt.  I have left the message that I have not cancelled the appt until I hear from her.

## 2013-08-25 ENCOUNTER — Encounter (INDEPENDENT_AMBULATORY_CARE_PROVIDER_SITE_OTHER): Payer: Self-pay | Admitting: General Surgery

## 2013-08-25 ENCOUNTER — Ambulatory Visit (INDEPENDENT_AMBULATORY_CARE_PROVIDER_SITE_OTHER): Payer: Medicare Other | Admitting: General Surgery

## 2013-08-25 VITALS — BP 100/70 | HR 84 | Temp 98.2°F | Resp 16 | Ht <= 58 in | Wt 98.8 lb

## 2013-08-25 DIAGNOSIS — Z4889 Encounter for other specified surgical aftercare: Secondary | ICD-10-CM

## 2013-08-25 NOTE — Patient Instructions (Signed)
If you are eating well the next time you're seen, we will remove the tube.

## 2013-08-25 NOTE — Progress Notes (Signed)
Procedure:  Reduction of recurrent hiatal hernia and gastrostomy tube placement with evacuation of gastric bezoar  Date:  07/30/2013  History:  She is here for her first postoperative visit. She had a prolonged hospital stay but now is in a skilled nursing facility and is improving. She is taking an oral diet better. The lower aspect of her firm down the wound is healing in by secondary intention. Dressing changes are being performed at the skilled nursing facility.  Exam: General- Elderly female in a wheelchair Abd-soft, inferior aspect of wound is open and clean, gastrostomy tube in left abdomen  Assessment:  She is making slow but steady progress. The wound is healing in slowly  Plan:  Continue saline damp to dry dressing changes. We'll see her back in 3-4 weeks. If she is consistantly eating well, we will remove the gastrostomy tube.

## 2013-08-28 ENCOUNTER — Ambulatory Visit: Payer: Medicare Other | Admitting: Internal Medicine

## 2013-09-14 ENCOUNTER — Telehealth (INDEPENDENT_AMBULATORY_CARE_PROVIDER_SITE_OTHER): Payer: Self-pay

## 2013-09-14 NOTE — Telephone Encounter (Signed)
Nurse from [facility] calling for a change in orders.  Pt is currently a wet/dry dressing daily.  She would like to change to silver alginate covered with a small piece of foam and entire area then covered with a Tegaderm dressing 3 x week.  Pt's daughter also called today requesting that her mother be seen as soon as possible per "home health nurse".  Due to conflicting information, family will be contacted and made aware of possible change in orders, but no need to be seen at this time unless Dr. Abbey Chattersosenbower deems necessary.

## 2013-09-14 NOTE — Telephone Encounter (Signed)
Spoke with the pt's son.  He reports that Ms. [name] has an appointment today with our urgent office physician.  They will bring home health orders with them.  He also reports that the feeding tube has not been used.  It was tried once, but patient did not tolerate.  He says she is now eating well.  All information will be given to Dr. Janee Mornhompson who will be seeing this patient today.

## 2013-09-14 NOTE — Telephone Encounter (Signed)
Nurse also reports that pt's feeding tube is very clogged and looks as if it has not been flushed since she left the hospital.  Would like instructions on this as well.

## 2013-09-14 NOTE — Telephone Encounter (Signed)
Close encounter 

## 2013-09-14 NOTE — Telephone Encounter (Signed)
PCP or home health nurse may remove feeding tube if she is not using it.

## 2013-09-14 NOTE — Telephone Encounter (Signed)
Pt is not scheduled to see Dr. Janee Mornhompson.  Son thought he was talking to PCP's office.  Pt does have an appt with PCP today at 3:00 to evaluate feeding tube.  I told him I was not sure if the PCP would remove the tube.  She may defer back to Dr. Abbey Chattersosenbower.  He understood.

## 2013-09-16 ENCOUNTER — Ambulatory Visit (HOSPITAL_COMMUNITY): Payer: Medicare Other

## 2013-09-18 ENCOUNTER — Telehealth (INDEPENDENT_AMBULATORY_CARE_PROVIDER_SITE_OTHER): Payer: Self-pay | Admitting: *Deleted

## 2013-09-18 NOTE — Telephone Encounter (Signed)
Emma with PT called to ask for a verbal order for them to see patient 2 times per week for 6 weeks.  Spoke to Dr. Abbey Chattersosenbower in clinic who approved this order.  Kara Meadmma updated at this time and states understanding.

## 2013-09-23 ENCOUNTER — Encounter (INDEPENDENT_AMBULATORY_CARE_PROVIDER_SITE_OTHER): Payer: Self-pay

## 2013-09-24 ENCOUNTER — Telehealth (INDEPENDENT_AMBULATORY_CARE_PROVIDER_SITE_OTHER): Payer: Self-pay | Admitting: General Surgery

## 2013-09-24 NOTE — Telephone Encounter (Signed)
Joni, nurse with [facility], called to report the small wound on pt's abdomen will no longer support packing for wet-to-dry dressings.  Gave verbal order for her to cleanse area and cover with sterile dressing, as the G-tube still has some amount of drainage.  Her follow-up appt in the office is on 09/28/13.

## 2013-09-28 ENCOUNTER — Ambulatory Visit (INDEPENDENT_AMBULATORY_CARE_PROVIDER_SITE_OTHER): Payer: Medicare Other | Admitting: General Surgery

## 2013-09-28 ENCOUNTER — Encounter (HOSPITAL_COMMUNITY): Payer: Self-pay

## 2013-09-28 ENCOUNTER — Ambulatory Visit (HOSPITAL_COMMUNITY)
Admission: RE | Admit: 2013-09-28 | Discharge: 2013-09-28 | Disposition: A | Discharge status: Home / Self Care | Payer: Medicare Other | Source: Ambulatory Visit | Attending: Family Medicine | Admitting: Family Medicine

## 2013-09-28 ENCOUNTER — Other Ambulatory Visit (HOSPITAL_COMMUNITY): Payer: Self-pay | Admitting: Family Medicine

## 2013-09-28 ENCOUNTER — Encounter (INDEPENDENT_AMBULATORY_CARE_PROVIDER_SITE_OTHER): Payer: Self-pay | Admitting: General Surgery

## 2013-09-28 VITALS — BP 108/76 | HR 116 | Temp 98.8°F | Resp 14 | Ht 59.0 in | Wt 93.4 lb

## 2013-09-28 DIAGNOSIS — M810 Age-related osteoporosis without current pathological fracture: Secondary | ICD-10-CM | POA: Insufficient documentation

## 2013-09-28 DIAGNOSIS — Z4889 Encounter for other specified surgical aftercare: Secondary | ICD-10-CM

## 2013-09-28 MED ORDER — IBANDRONATE SODIUM 3 MG/3ML IV SOLN
3.0000 mg | Freq: Once | INTRAVENOUS | Status: AC
  Administered 2013-09-28: 3 mg via INTRAVENOUS
  Filled 2013-09-28: qty 3

## 2013-09-28 MED ORDER — SODIUM CHLORIDE 0.9 % IV SOLN
Freq: Once | INTRAVENOUS | Status: AC
  Administered 2013-09-28: 14:00:00 via INTRAVENOUS

## 2013-09-28 NOTE — Progress Notes (Signed)
Procedure:  Reduction of recurrent hiatal hernia and gastrostomy tube placement with evacuation of gastric bezoar  Date:  07/30/2013  History:  She is here for her second postoperative visit.  She is tolerating an oral diet and not using the gastrostomy tube anymore. Exam: General- Elderly female in NAD Abd-soft, wound has healed, gastrostomy tube in left upper quadrant-This tube was removed and a dry dressing applied. She did have some bleeding from granulation tissue which I controlled with silver nitrate. Assessment: She continues to make progress. The gastrostomy tube was removed.  Plan: I gave him instructions regarding the gastrostomy tube site with respect to wound care. Return visit 6 weeks. Continue light activities.

## 2013-09-28 NOTE — Discharge Instructions (Signed)
Ibandronate injection °What is this medicine? °IBANDRONATE (i BAN droh nate) slows calcium loss from bones. It is used to treat osteoporosis in women past the age of menopause. °This medicine may be used for other purposes; ask your health care provider or pharmacist if you have questions. °COMMON BRAND NAME(S): Boniva °What should I tell my health care provider before I take this medicine? °They need to know if you have any of these conditions: °-dental disease °-kidney disease °-low levels of calcium in the blood °-low levels of vitamin D in the blood °-an unusual or allergic reaction to ibandronate, other medicines, foods, dyes, or preservatives °-pregnant or trying to get pregnant °-breast-feeding °How should I use this medicine? °This medicine is for injection into a vein. It is given by a health care professional in a hospital or clinic setting. °Talk to your pediatrician regarding the use of this medicine in children. Special care may be needed. °Overdosage: If you think you have taken too much of this medicine contact a poison control center or emergency room at once. °NOTE: This medicine is only for you. Do not share this medicine with others. °What if I miss a dose? °It is important not to miss your dose. Call your doctor or health care professional if you are unable to keep an appointment. °What may interact with this medicine? °-teriparatide °This list may not describe all possible interactions. Give your health care provider a list of all the medicines, herbs, non-prescription drugs, or dietary supplements you use. Also tell them if you smoke, drink alcohol, or use illegal drugs. Some items may interact with your medicine. °What should I watch for while using this medicine? °Visit your doctor or health care professional for regular check ups. It may be some time before you see the benefit from this medicine. Do not stop taking your medicine except on your doctor's advice. Your doctor or health care  professional may order blood tests and other tests to see how you are doing. °You should make sure you get enough calcium and vitamin D while you are taking this medicine, unless your doctor tells you not to. Discuss the foods you eat and the vitamins you take with your health care professional. °Some people who take this medicine have severe bone, joint, and/or muscle pain. This medicine may also increase your risk for a broken thigh bone. Tell your doctor right away if you have pain in your upper leg or groin. Tell your doctor if you have any pain that does not go away or that gets worse. °What side effects may I notice from receiving this medicine? °Side effects that you should report to your doctor or health care professional as soon as possible: °-allergic reactions such as skin rash or itching, hives, swelling of the face, lips, throat, or tongue °-changes in vision °-chest pain °-fever, flu-like symptoms °-heartburn or stomach pain °-jaw pain, especially after dental work °Side effects that usually do not require medical attention (report to your doctor or health care professional if they continue or are bothersome): °-bone, muscle or joint pain °-diarrhea or constipation °-eye pain or itching °-headache °-irritation at site where injected °-nausea °This list may not describe all possible side effects. Call your doctor for medical advice about side effects. You may report side effects to FDA at 1-800-FDA-1088. °Where should I keep my medicine? °This drug is given in a hospital or clinic and will not be stored at home. °NOTE: This sheet is a summary. It may not   cover all possible information. If you have questions about this medicine, talk to your doctor, pharmacist, or health care provider. °© 2014, Elsevier/Gold Standard. (2011-02-23 09:02:20) °Osteoporosis °Throughout your life, your body breaks down old bone and replaces it with new bone. As you get older, your body does not replace bone as quickly as it  breaks it down. By the age of 30 years, most people begin to gradually lose bone because of the imbalance between bone loss and replacement. Some people lose more bone than others. Bone loss beyond a specified normal degree is considered osteoporosis.  °Osteoporosis affects the strength and durability of your bones. The inside of the ends of your bones and your flat bones, like the bones of your pelvis, look like honeycomb, filled with tiny open spaces. As bone loss occurs, your bones become less dense. This means that the open spaces inside your bones become bigger and the walls between these spaces become thinner. This makes your bones weaker. Bones of a person with osteoporosis can become so weak that they can break (fracture) during minor accidents, such as a simple fall. °CAUSES  °The following factors have been associated with the development of osteoporosis: °· Smoking. °· Drinking more than 2 alcoholic drinks several days per week. °· Long-term use of certain medicines: °· Corticosteroids. °· Chemotherapy medicines. °· Thyroid medicines. °· Antiepileptic medicines. °· Gonadal hormone suppression medicine. °· Immunosuppression medicine. °· Being underweight. °· Lack of physical activity. °· Lack of exposure to the sun. This can lead to vitamin D deficiency. °· Certain medical conditions: °· Certain inflammatory bowel diseases, such as Crohn disease and ulcerative colitis. °· Diabetes. °· Hyperthyroidism. °· Hyperparathyroidism. °RISK FACTORS °Anyone can develop osteoporosis. However, the following factors can increase your risk of developing osteoporosis: °· Gender Women are at higher risk than men. °· Age Being older than 50 years increases your risk. °· Ethnicity White and Asian people have an increased risk. °· Weight Being extremely underweight can increase your risk of osteoporosis. °· Family history of osteoporosis Having a family member who has developed osteoporosis can increase your risk. °SYMPTOMS   °Usually, people with osteoporosis have no symptoms.  °DIAGNOSIS  °Signs during a physical exam that may prompt your caregiver to suspect osteoporosis include: °· Decreased height. This is usually caused by the compression of the bones that form your spine (vertebrae) because they have weakened and become fractured. °· A curving or rounding of the upper back (kyphosis). °To confirm signs of osteoporosis, your caregiver may request a procedure that uses 2 low-dose X-ray beams with different levels of energy to measure your bone mineral density (dual-energy X-ray absorptiometry [DXA]). Also, your caregiver may check your level of vitamin D. °TREATMENT  °The goal of osteoporosis treatment is to strengthen bones in order to decrease the risk of bone fractures. There are different types of medicines available to help achieve this goal. Some of these medicines work by slowing the processes of bone loss. Some medicines work by increasing bone density. Treatment also involves making sure that your levels of calcium and vitamin D are adequate. °PREVENTION  °There are things you can do to help prevent osteoporosis. Adequate intake of calcium and vitamin D can help you achieve optimal bone mineral density. Regular exercise can also help, especially resistance and weight-bearing activities. If you smoke, quitting smoking is an important part of osteoporosis prevention. °MAKE SURE YOU: °· Understand these instructions. °· Will watch your condition. °· Will get help right away if you are   not doing well or get worse. °FOR MORE INFORMATION °www.osteo.org and www.nof.org °Document Released: 06/06/2005 Document Revised: 12/22/2012 Document Reviewed: 08/11/2011 °ExitCare® Patient Information ©2014 [employer]. ° ° °

## 2013-09-28 NOTE — Patient Instructions (Signed)
No lifting over 10 pounds. Change bandage at least once a day.

## 2013-10-01 ENCOUNTER — Telehealth (INDEPENDENT_AMBULATORY_CARE_PROVIDER_SITE_OTHER): Payer: Self-pay | Admitting: General Surgery

## 2013-10-01 NOTE — Telephone Encounter (Signed)
Pt called to ask if she needs to continue taking the Florestor that she is about to finish.  She is no longer taking antibiotics and not having diarrhea.  Explained she may stop taking it when it runs out.  She understands.

## 2013-10-05 ENCOUNTER — Other Ambulatory Visit: Payer: Self-pay | Admitting: Internal Medicine

## 2013-10-15 ENCOUNTER — Telehealth (INDEPENDENT_AMBULATORY_CARE_PROVIDER_SITE_OTHER): Payer: Self-pay | Admitting: General Surgery

## 2013-10-15 NOTE — Telephone Encounter (Signed)
Pt called to let us know she is having some diarrhea again.  She immediately restarted her (generic) Immodium and will also restart the Florestor.  Calling today to ask if okay to use a generic for the Florestor and instructed it would be fine.  Continue to abide by the label directions for it.

## 2013-11-02 ENCOUNTER — Encounter: Payer: Self-pay | Admitting: Internal Medicine

## 2013-11-02 ENCOUNTER — Ambulatory Visit (INDEPENDENT_AMBULATORY_CARE_PROVIDER_SITE_OTHER): Payer: Medicare Other | Admitting: Internal Medicine

## 2013-11-02 VITALS — BP 100/64 | HR 88 | Ht <= 58 in | Wt 97.1 lb

## 2013-11-02 DIAGNOSIS — K861 Other chronic pancreatitis: Secondary | ICD-10-CM

## 2013-11-02 DIAGNOSIS — R1012 Left upper quadrant pain: Secondary | ICD-10-CM

## 2013-11-02 DIAGNOSIS — K589 Irritable bowel syndrome without diarrhea: Secondary | ICD-10-CM

## 2013-11-02 DIAGNOSIS — K219 Gastro-esophageal reflux disease without esophagitis: Secondary | ICD-10-CM

## 2013-11-02 MED ORDER — PANCRELIPASE (LIP-PROT-AMYL) 24000-76000 UNITS PO CPEP: 3.0000 | ORAL_CAPSULE | Freq: Three times a day (TID) | ORAL | Status: DC

## 2013-11-02 MED ORDER — OMEPRAZOLE 20 MG PO CPDR: 20.0000 mg | DELAYED_RELEASE_CAPSULE | Freq: Every day | ORAL | Status: DC

## 2013-11-02 NOTE — Assessment & Plan Note (Signed)
She seems to be stable on probiotic, Lotronex once a day and some Imodium. Will continue.

## 2013-11-02 NOTE — Assessment & Plan Note (Signed)
Refill and continue Creon

## 2013-11-02 NOTE — Assessment & Plan Note (Signed)
Refill omeprazole

## 2013-11-02 NOTE — Progress Notes (Signed)
Subjective:    Patient ID: [name], female    DOB: Feb 07, 1941, 73 y.o.   MRN: [id number]  HPI The patient is here for followup of her IBS, GERD and chronic pancreatitis plus she was hospitalized with a gastric volvulus and outlet obstruction in November. Dr. Abbey Chatters performed reduction of the gastric volvulus, with reduction of a large hiatal hernia as well as gastrostomy tube placement. She is come along and is living with her daughter right now after going to a nursing home for rehabilitation. Her main complaint today is that of some intermittent burning and sharp pain around the site for gastrostomy. It is short-lived. Otherwise she seems to be doing reasonably well like refills on her Creon and her omeprazole. Medications, allergies, past medical history, past surgical history, family history and social history are reviewed and updated in the EMR.   Review of Systems Gradually gaining strength. She is performing a physical therapy exercises at home.    Objective:   Physical Exam  elderly [employer] NAD Severe kyphoscoliosis Abdomen with upper midline scar - gastrostomy scar - all look ok, nothinh unusual and no tenderness     Assessment & Plan:  LUQ pain  IBS (irritable bowel syndrome)  Chronic pancreatitis  GERD  I told her I thought the left upper quadrant pain was postoperative, probably where her gastrostomy tube, which was removed one month ago, site is healing. It should improve with time. I don't think any imaging as needed. I will refill her medications for her chronic GI problems otherwise in anticipate seeing her in a year sooner if needed.  Current outpatient prescriptions:acetaminophen (TYLENOL) 325 MG tablet, Take 1-2 tablets (325-650 mg total) by mouth every 6 (six) hours as needed for fever, headache, mild pain or moderate pain., Disp: , Rfl: ;  alosetron (LOTRONEX) 1 MG tablet, Take 1 mg by mouth daily with breakfast., Disp: , Rfl:  amLODipine-benazepril  (LOTREL) 5-20 MG per capsule, Take 1 capsule by mouth daily after breakfast. Patient took at home prior to arrival to Short Stay, Disp: , Rfl: ;  aspirin 81 MG tablet, Take 81 mg by mouth daily after breakfast. , Disp: , Rfl: ;  ferrous sulfate 325 (65 FE) MG tablet, Take 1 tablet (325 mg total) by mouth 3 (three) times daily with meals., Disp: , Rfl: 3 ibandronate (BONIVA) 150 MG tablet, Take 150 mg by mouth every 3 (three) months. Take 1 tablet daily for one week then off one week, Disp: , Rfl: ;  loperamide (IMODIUM) 2 MG capsule, Take 2 mg by mouth once., Disp: , Rfl: ;  mirabegron ER (MYRBETRIQ) 25 MG TB24 tablet, Take 25 mg by mouth daily., Disp: , Rfl: ;  Multiple Vitamins-Minerals (CENTRUM ULTRA WOMENS) TABS, Take 1 tablet by mouth daily., Disp: , Rfl:  omeprazole (PRILOSEC) 20 MG capsule, Take 1 capsule (20 mg total) by mouth daily before breakfast., Disp: 90 capsule, Rfl: 3;  ondansetron (ZOFRAN) 4 MG tablet, Take 1 tablet (4 mg total) by mouth every 6 (six) hours as needed for nausea or vomiting., Disp: 20 tablet, Rfl: 0 Pancrelipase, Lip-Prot-Amyl, (CREON) 24000 UNITS CPEP, Take 3 capsules (72,000 Units total) by mouth 3 (three) times daily with meals. Take 3 caps with each meal and 3 caps with each snack, Disp: 360 capsule, Rfl: 3;  saccharomyces boulardii (FLORASTOR) 250 MG capsule, Take 1 capsule (250 mg total) by mouth 2 (two) times daily., Disp: , Rfl: ;  Tafluprost (ZIOPTAN)  0.0015 % SOLN, Apply 1 drop to eye at bedtime. , Disp: , Rfl:  timolol (BETIMOL) 0.5 % ophthalmic solution, Place 1 drop into both eyes 2 (two) times daily., Disp: , Rfl: ;  TIMOPTIC OCUDOSE 0.5 % SOLN, , Disp: , Rfl: ;  Vitamin D, Ergocalciferol, (DRISDOL) 50000 UNITS CAPS, Take 50,000 Units by mouth every 14 (fourteen) days. , Disp: , Rfl: ;  Wheat Dextrin (BENEFIBER) TABS, Take 2 tablets by mouth 2 (two) times daily., Disp: , Rfl:     CC: Hollice EspyGATES,DONNA RUTH, MD

## 2013-11-02 NOTE — Patient Instructions (Signed)
We have sent the following medications to your pharmacy for you to pick up at your convenience: Omeprazole and creon  If you continue to have left lower abdominal pain please discuss with Dr. Abbey Chattersosenbower at your follow up visit with him.    Follow up with us in a year.  I appreciate the opportunity to care for you.

## 2013-11-03 ENCOUNTER — Telehealth (INDEPENDENT_AMBULATORY_CARE_PROVIDER_SITE_OTHER): Payer: Self-pay

## 2013-11-03 NOTE — Telephone Encounter (Signed)
Patient burning and stinging sensation internally  abdomen, she did see Dr. Leone PayorGessner yesterday . He told her that was normal but she felt DR. Rosenbower should know. Advised her to call if her condition got worse.

## 2013-11-03 NOTE — Telephone Encounter (Signed)
Noted  

## 2013-11-30 ENCOUNTER — Ambulatory Visit (INDEPENDENT_AMBULATORY_CARE_PROVIDER_SITE_OTHER): Payer: Medicare Other | Admitting: General Surgery

## 2013-11-30 ENCOUNTER — Encounter (INDEPENDENT_AMBULATORY_CARE_PROVIDER_SITE_OTHER): Payer: Self-pay | Admitting: General Surgery

## 2013-11-30 VITALS — BP 128/78 | HR 86 | Temp 98.0°F | Resp 14

## 2013-11-30 DIAGNOSIS — K449 Diaphragmatic hernia without obstruction or gangrene: Secondary | ICD-10-CM

## 2013-11-30 NOTE — Progress Notes (Signed)
Procedure:  Reduction of recurrent hiatal hernia and gastrostomy tube placement with evacuation of gastric bezoar  Date:  07/30/2013  History:  She is therefore a followup visit. She is doing well. No difficulty with swallowing. No heartburn. Exam: General- Elderly female in NAD Abd-Soft, incision and gastrostomy tube site have healed nicely. Assessment: Doing well following emergency repair of recurrent hiatal hernia.  Plan:  Avoid lifting over 20 pounds. May drive. Return visit as needed.

## 2013-11-30 NOTE — Patient Instructions (Signed)
No lifting over 20 pounds. This is lifelong.

## 2013-12-16 ENCOUNTER — Other Ambulatory Visit: Payer: Self-pay | Admitting: Internal Medicine

## 2013-12-17 ENCOUNTER — Telehealth: Payer: Self-pay

## 2013-12-17 NOTE — Telephone Encounter (Signed)
Spoke with Josh at [facility] # [phone] to get re-authorization on Lotronex 1 mg tablets.  Dx. : 564.1 IBS.  Approved thru 06/18/2014.

## 2013-12-25 MED ORDER — IBANDRONATE SODIUM 3 MG/3ML IV SOLN: 3.0000 mg | Freq: Once | INTRAVENOUS | Status: DC

## 2013-12-25 MED ORDER — SODIUM CHLORIDE 0.9 % IV SOLN: 250.0000 mL | Freq: Once | INTRAVENOUS | Status: DC

## 2013-12-28 ENCOUNTER — Ambulatory Visit (HOSPITAL_COMMUNITY)
Admission: RE | Admit: 2013-12-28 | Discharge: 2013-12-28 | Disposition: A | Discharge status: Home / Self Care | Payer: Medicare Other | Source: Ambulatory Visit | Attending: Family Medicine | Admitting: Family Medicine

## 2013-12-28 ENCOUNTER — Encounter (HOSPITAL_COMMUNITY): Payer: Self-pay

## 2013-12-28 DIAGNOSIS — M810 Age-related osteoporosis without current pathological fracture: Secondary | ICD-10-CM | POA: Insufficient documentation

## 2013-12-28 HISTORY — DX: Unspecified glaucoma: H40.9

## 2013-12-28 HISTORY — DX: Acute pancreatitis without necrosis or infection, unspecified: K85.90

## 2013-12-28 MED ORDER — IBANDRONATE SODIUM 3 MG/3ML IV SOLN
3.0000 mg | Freq: Once | INTRAVENOUS | Status: AC
  Administered 2013-12-28: 3 mg via INTRAVENOUS
  Filled 2013-12-28: qty 3

## 2013-12-28 MED ORDER — SODIUM CHLORIDE 0.9 % IV SOLN
250.0000 mL | Freq: Once | INTRAVENOUS | Status: AC
  Administered 2013-12-28: 250 mL via INTRAVENOUS

## 2014-03-29 ENCOUNTER — Ambulatory Visit (HOSPITAL_COMMUNITY)
Admission: RE | Admit: 2014-03-29 | Discharge: 2014-03-29 | Disposition: A | Discharge status: Home / Self Care | Payer: Medicare Other | Source: Ambulatory Visit | Attending: Family Medicine | Admitting: Family Medicine

## 2014-03-29 ENCOUNTER — Other Ambulatory Visit (HOSPITAL_COMMUNITY): Payer: Self-pay | Admitting: Family Medicine

## 2014-03-29 ENCOUNTER — Encounter (HOSPITAL_COMMUNITY): Payer: Self-pay

## 2014-03-29 DIAGNOSIS — M810 Age-related osteoporosis without current pathological fracture: Secondary | ICD-10-CM | POA: Insufficient documentation

## 2014-03-29 MED ORDER — IBANDRONATE SODIUM 3 MG/3ML IV SOLN
3.0000 mg | Freq: Once | INTRAVENOUS | Status: AC
  Administered 2014-03-29: 3 mg via INTRAVENOUS
  Filled 2014-03-29: qty 3

## 2014-03-29 MED ORDER — SODIUM CHLORIDE 0.9 % IV SOLN
Freq: Once | INTRAVENOUS | Status: AC
  Administered 2014-03-29: 250 mL via INTRAVENOUS

## 2014-03-29 NOTE — Discharge Instructions (Signed)
Ibandronate injection °What is this medicine? °IBANDRONATE (i BAN droh nate) slows calcium loss from bones. It is used to treat osteoporosis in women past the age of menopause. °This medicine may be used for other purposes; ask your health care provider or pharmacist if you have questions. °COMMON BRAND NAME(S): Boniva °What should I tell my health care provider before I take this medicine? °They need to know if you have any of these conditions: °-dental disease °-kidney disease °-low levels of calcium in the blood °-low levels of vitamin D in the blood °-an unusual or allergic reaction to ibandronate, other medicines, foods, dyes, or preservatives °-pregnant or trying to get pregnant °-breast-feeding °How should I use this medicine? °This medicine is for injection into a vein. It is given by a health care professional in a hospital or clinic setting. °Talk to your pediatrician regarding the use of this medicine in children. Special care may be needed. °Overdosage: If you think you have taken too much of this medicine contact a poison control center or emergency room at once. °NOTE: This medicine is only for you. Do not share this medicine with others. °What if I miss a dose? °It is important not to miss your dose. Call your doctor or health care professional if you are unable to keep an appointment. °What may interact with this medicine? °-teriparatide °This list may not describe all possible interactions. Give your health care provider a list of all the medicines, herbs, non-prescription drugs, or dietary supplements you use. Also tell them if you smoke, drink alcohol, or use illegal drugs. Some items may interact with your medicine. °What should I watch for while using this medicine? °Visit your doctor or health care professional for regular check ups. It may be some time before you see the benefit from this medicine. Do not stop taking your medicine except on your doctor's advice. Your doctor or health care  professional may order blood tests and other tests to see how you are doing. °You should make sure you get enough calcium and vitamin D while you are taking this medicine, unless your doctor tells you not to. Discuss the foods you eat and the vitamins you take with your health care professional. °Some people who take this medicine have severe bone, joint, and/or muscle pain. This medicine may also increase your risk for a broken thigh bone. Tell your doctor right away if you have pain in your upper leg or groin. Tell your doctor if you have any pain that does not go away or that gets worse. °What side effects may I notice from receiving this medicine? °Side effects that you should report to your doctor or health care professional as soon as possible: °-allergic reactions such as skin rash or itching, hives, swelling of the face, lips, throat, or tongue °-changes in vision °-chest pain °-fever, flu-like symptoms °-heartburn or stomach pain °-jaw pain, especially after dental work °Side effects that usually do not require medical attention (report to your doctor or health care professional if they continue or are bothersome): °-bone, muscle or joint pain °-diarrhea or constipation °-eye pain or itching °-headache °-irritation at site where injected °-nausea °This list may not describe all possible side effects. Call your doctor for medical advice about side effects. You may report side effects to FDA at 1-800-FDA-1088. °Where should I keep my medicine? °This drug is given in a hospital or clinic and will not be stored at home. °NOTE: This sheet is a summary. It may not   cover all possible information. If you have questions about this medicine, talk to your doctor, pharmacist, or health care provider. °© 2015, [employer]. (2011-02-23 09:02:20) ° °

## 2014-04-07 ENCOUNTER — Other Ambulatory Visit: Payer: Self-pay | Admitting: Internal Medicine

## 2014-06-28 ENCOUNTER — Encounter (HOSPITAL_COMMUNITY)
Admission: RE | Admit: 2014-06-28 | Discharge: 2014-06-28 | Disposition: A | Discharge status: Home / Self Care | Payer: Medicare Other | Source: Ambulatory Visit | Attending: Family Medicine | Admitting: Family Medicine

## 2014-06-28 ENCOUNTER — Encounter (HOSPITAL_COMMUNITY): Payer: Self-pay

## 2014-06-28 DIAGNOSIS — M810 Age-related osteoporosis without current pathological fracture: Secondary | ICD-10-CM | POA: Diagnosis not present

## 2014-06-28 MED ORDER — SODIUM CHLORIDE 0.9 % IV SOLN
Freq: Once | INTRAVENOUS | Status: AC
  Administered 2014-06-28: 11:00:00 via INTRAVENOUS

## 2014-06-28 MED ORDER — IBANDRONATE SODIUM 3 MG/3ML IV SOLN
3.0000 mg | Freq: Once | INTRAVENOUS | Status: AC
  Administered 2014-06-28: 3 mg via INTRAVENOUS
  Filled 2014-06-28: qty 3

## 2014-07-05 ENCOUNTER — Telehealth: Payer: Self-pay

## 2014-07-05 NOTE — Telephone Encounter (Signed)
Spoke with Darlene at [facility] [phone] to do a re-authorization for patient's Lotronex 1 mg tablets.  Dx IBS- K58.9.  Approved thru 01/04/2015.   [facility] [place] notified at [phone].

## 2014-08-30 ENCOUNTER — Other Ambulatory Visit: Payer: Self-pay

## 2014-08-30 MED ORDER — ALOSETRON HCL 1 MG PO TABS: 1.0000 mg | ORAL_TABLET | Freq: Two times a day (BID) | ORAL | Status: DC | PRN

## 2014-08-30 NOTE — Telephone Encounter (Signed)
Kimber from [facility] called to request that we mail them a handwritten rx for the Lotronex with the sticker on the rx.  She said I could stamp his name on it as Dr Leone PayorGessner is out of town

## 2014-09-07 ENCOUNTER — Other Ambulatory Visit: Payer: Self-pay | Admitting: Internal Medicine

## 2014-09-15 ENCOUNTER — Ambulatory Visit: Payer: Medicare Other | Admitting: Physical Therapy

## 2014-09-20 ENCOUNTER — Ambulatory Visit: Payer: Medicare Other | Attending: Internal Medicine | Admitting: Physical Therapy

## 2014-09-20 DIAGNOSIS — M810 Age-related osteoporosis without current pathological fracture: Secondary | ICD-10-CM | POA: Insufficient documentation

## 2014-09-20 DIAGNOSIS — M25861 Other specified joint disorders, right knee: Secondary | ICD-10-CM | POA: Insufficient documentation

## 2014-09-20 DIAGNOSIS — M40204 Unspecified kyphosis, thoracic region: Secondary | ICD-10-CM | POA: Insufficient documentation

## 2014-09-20 DIAGNOSIS — M25862 Other specified joint disorders, left knee: Secondary | ICD-10-CM | POA: Diagnosis not present

## 2014-09-20 DIAGNOSIS — M25872 Other specified joint disorders, left ankle and foot: Secondary | ICD-10-CM | POA: Diagnosis not present

## 2014-09-27 ENCOUNTER — Encounter (HOSPITAL_COMMUNITY): Admission: RE | Admit: 2014-09-27 | Payer: Medicare Other | Source: Ambulatory Visit

## 2014-09-28 ENCOUNTER — Ambulatory Visit: Payer: Medicare Other | Admitting: Physical Therapy

## 2014-09-28 DIAGNOSIS — M25872 Other specified joint disorders, left ankle and foot: Secondary | ICD-10-CM | POA: Diagnosis not present

## 2014-09-30 ENCOUNTER — Other Ambulatory Visit: Payer: Self-pay | Admitting: Internal Medicine

## 2014-10-05 ENCOUNTER — Ambulatory Visit: Payer: Medicare Other | Admitting: Physical Therapy

## 2014-10-12 ENCOUNTER — Ambulatory Visit: Payer: Medicare Other | Attending: Internal Medicine | Admitting: Physical Therapy

## 2014-10-12 DIAGNOSIS — M25862 Other specified joint disorders, left knee: Secondary | ICD-10-CM | POA: Insufficient documentation

## 2014-10-12 DIAGNOSIS — M40204 Unspecified kyphosis, thoracic region: Secondary | ICD-10-CM | POA: Insufficient documentation

## 2014-10-12 DIAGNOSIS — M25872 Other specified joint disorders, left ankle and foot: Secondary | ICD-10-CM | POA: Diagnosis not present

## 2014-10-12 DIAGNOSIS — M810 Age-related osteoporosis without current pathological fracture: Secondary | ICD-10-CM | POA: Diagnosis not present

## 2014-10-12 DIAGNOSIS — M25861 Other specified joint disorders, right knee: Secondary | ICD-10-CM | POA: Insufficient documentation

## 2015-01-03 IMAGING — CR DG ABDOMEN 2V
1 series · 2 of 2 positions shown · non-contrast
Comparison: CT abdomen pelvis dated 02/12/2008

CLINICAL DATA: Vomiting, known hiatal hernia, findings worrisome
for gastric volvulus on endoscopy

EXAM:
ABDOMEN - 2 VIEW

[Series 1: ap (kub) · U · 2 of 2 slices shown]
[im 1/2]
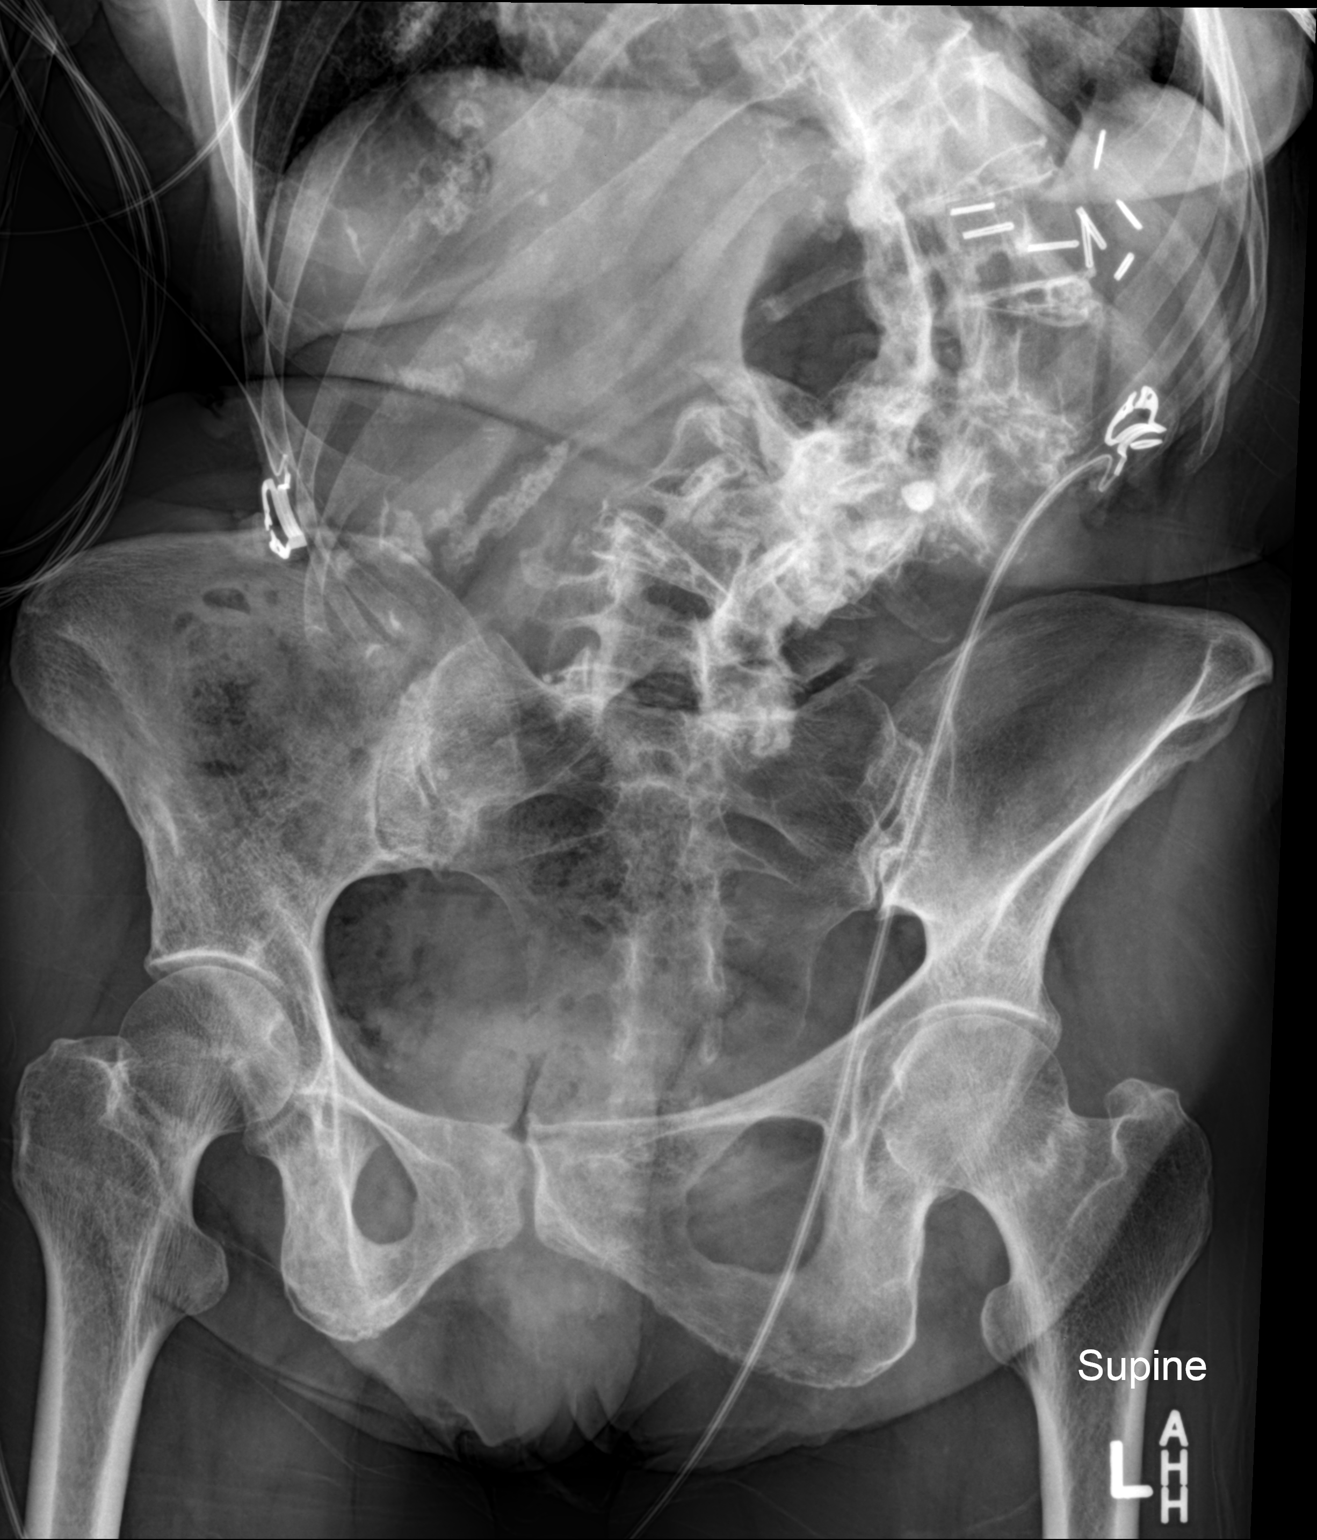
[im 2/2]
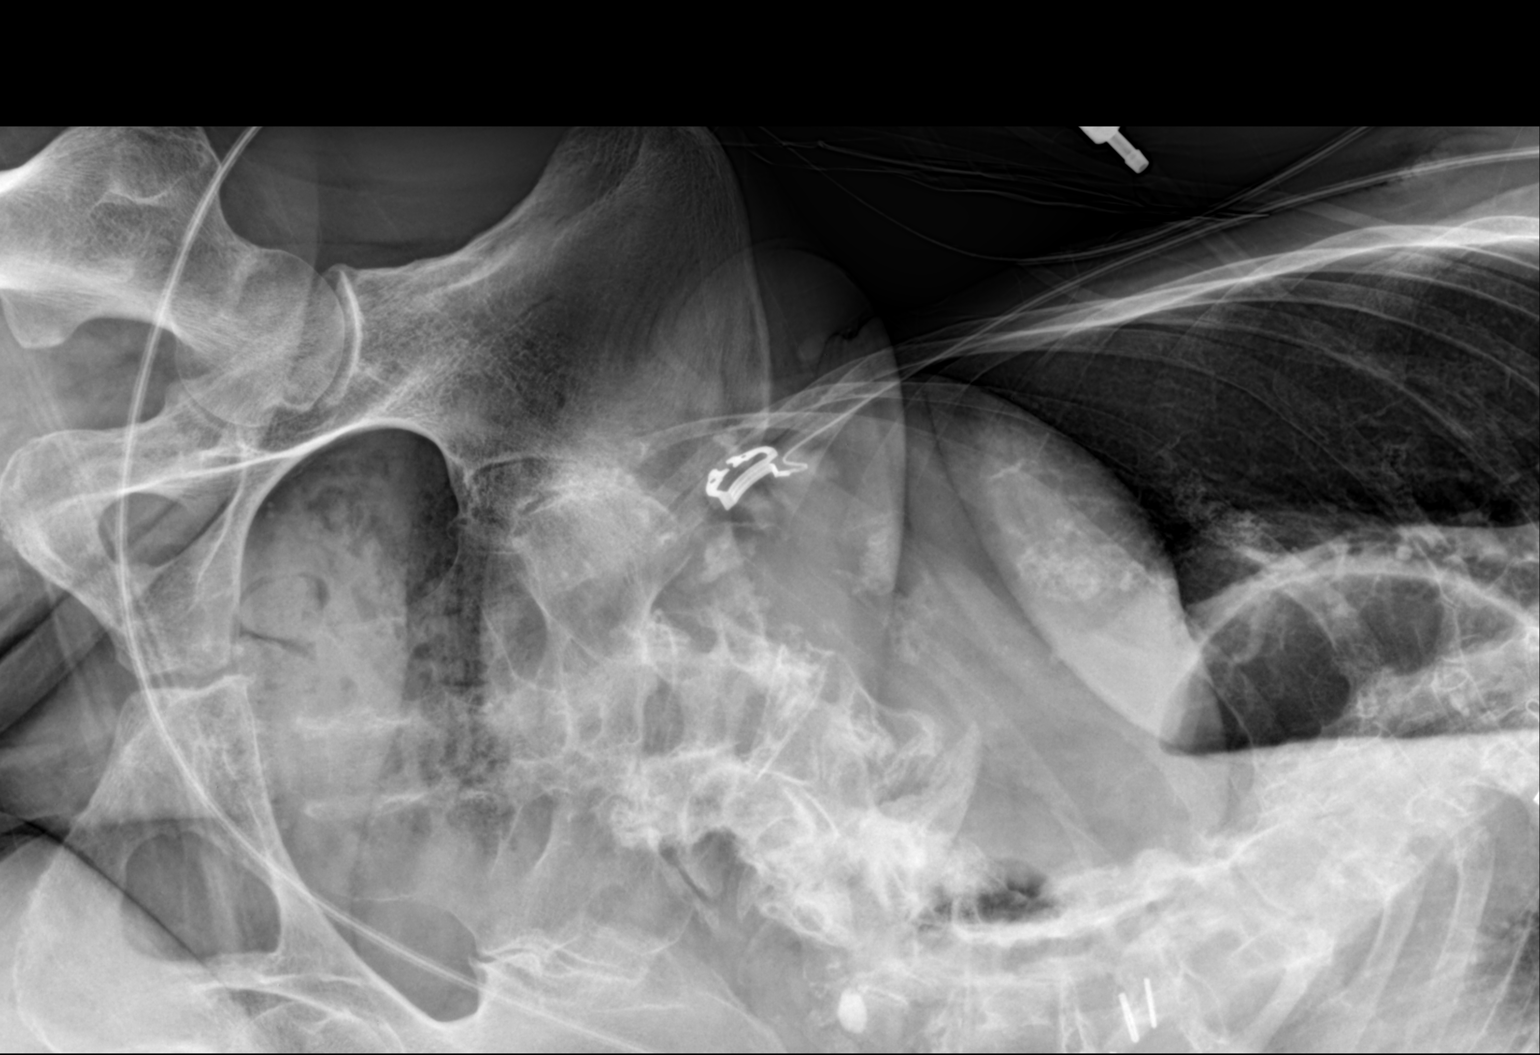

[2 of 2 positions shown; findings below may reference images not displayed]

FINDINGS: Nonspecific bowel gas pattern without findings suspicious for
obstruction.

No evidence of free air on the lateral decubitus view.

Large hiatal hernia with air-fluid level on the decubitus view.

Severe thoracolumbar scoliosis.

When correlating with the prior CT, there is a large hiatal hernia
with inverted intrathoracic stomach (greater curvature flipped
superiorly) on that study.
IMPRESSION: No findings to suggest bowel obstruction or free air.

Large hiatal hernia with air-fluid level on the decubitus view.

Prior CT demonstrates a large hiatal hernia with inverted
intrathoracic stomach. If there is clinical concern for superimposed
acute gastric volvulus, consider CT.

These results were called by telephone at the time of interpretation
on 07/28/2013 at [DATE] to Dr. KALERO VAN, who verbally
acknowledged these results.

## 2015-01-03 IMAGING — CR DG CHEST 1V PORT
1 series · 1 of 1 positions shown · non-contrast
Comparison: 07/28/2013

CLINICAL DATA: ET tube placement.

EXAM:
PORTABLE CHEST - 1 VIEW

[AP]
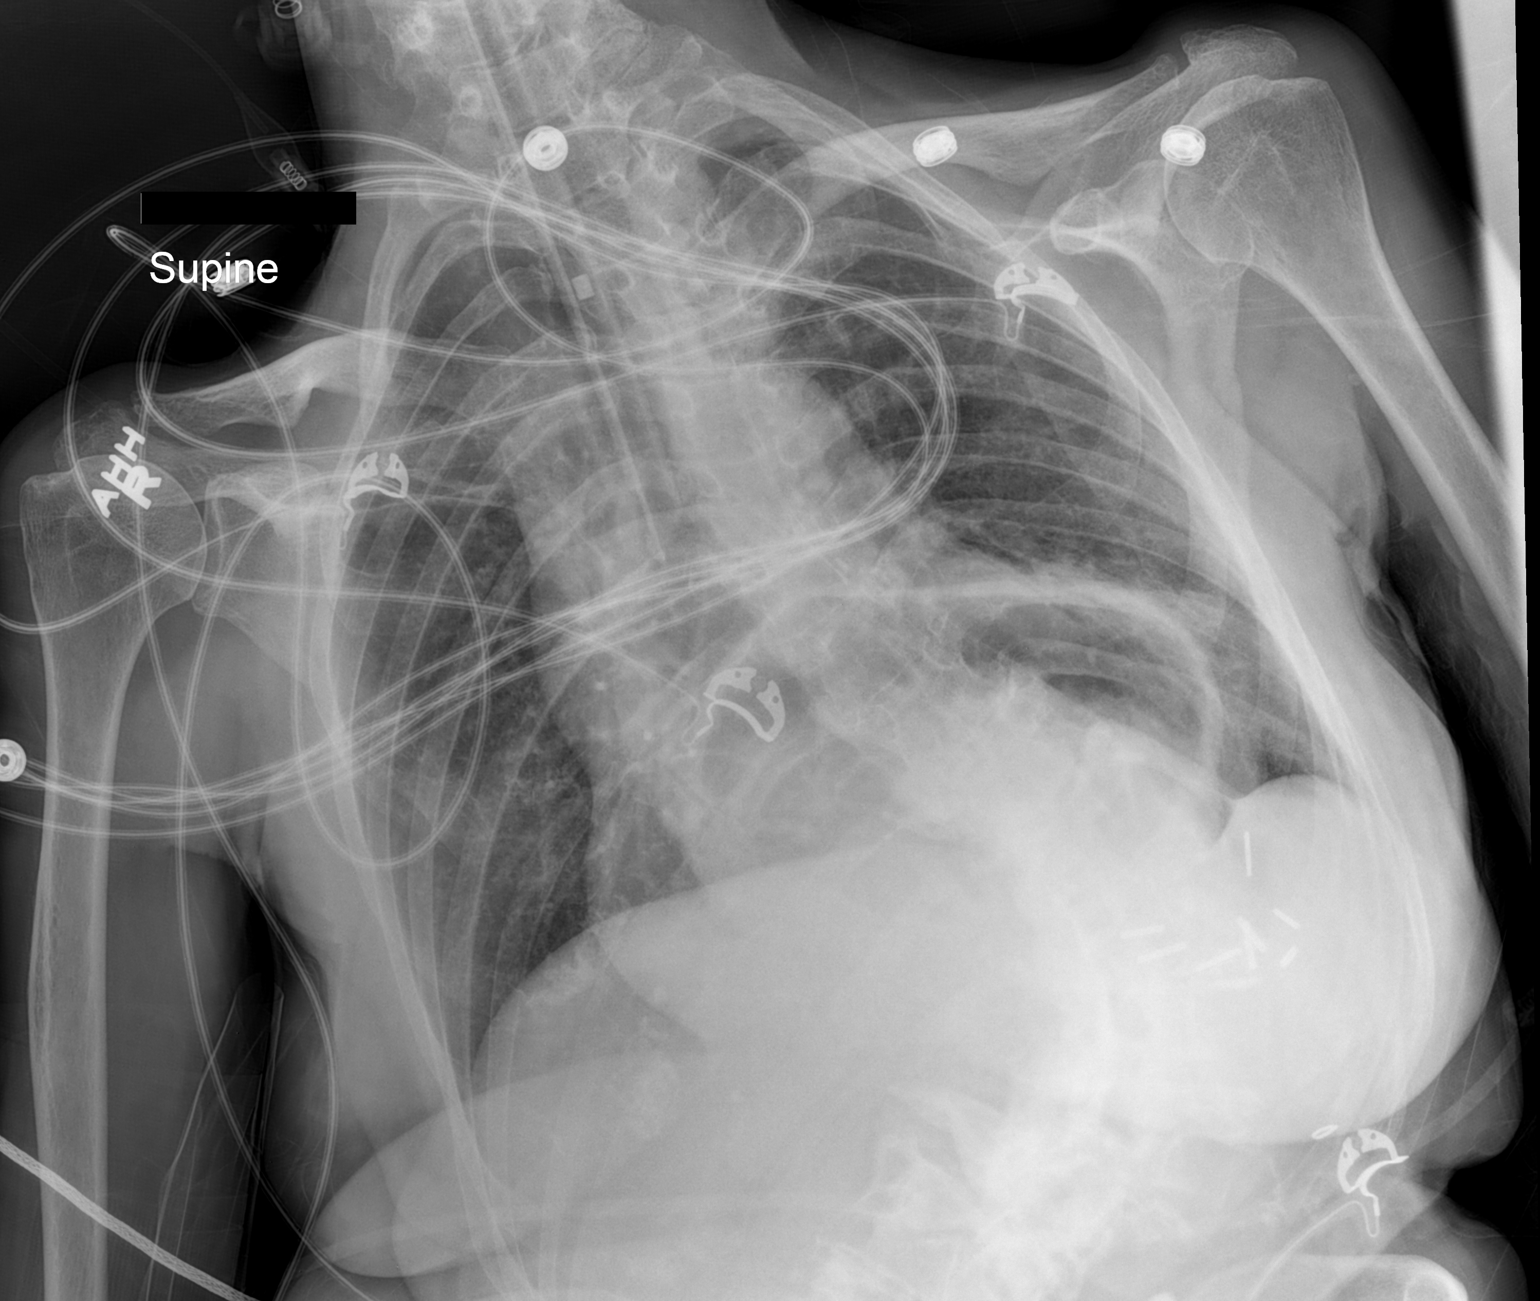

[1 of 1 positions shown; findings below may reference images not displayed]

FINDINGS: There is an endotracheal tube with the tip at the proximal most
portion of the right mainstem bronchus. There is no focal
parenchymal opacity. There is no pleural effusion or pneumothorax.
Stable heart mediastinum. Large hiatal hernia. The osseous
structures are unremarkable.
IMPRESSION: There is an endotracheal tube with the tip at the proximal most
portion of the right mainstem bronchus. The endotracheal tube has
already been retracted 3 cm by the time of this dictation. These
results were called by telephone at the time of interpretation on
07/28/2013 at [DATE] to Jor Zutphen, RN, who verbally acknowledged
these results.

## 2015-01-04 IMAGING — CR DG CHEST 1V PORT
1 series · 1 of 1 positions shown · non-contrast
Comparison: Chest x-ray from yesterday

CLINICAL DATA: Respiratory failure

EXAM:
PORTABLE CHEST - 1 VIEW

[AP]
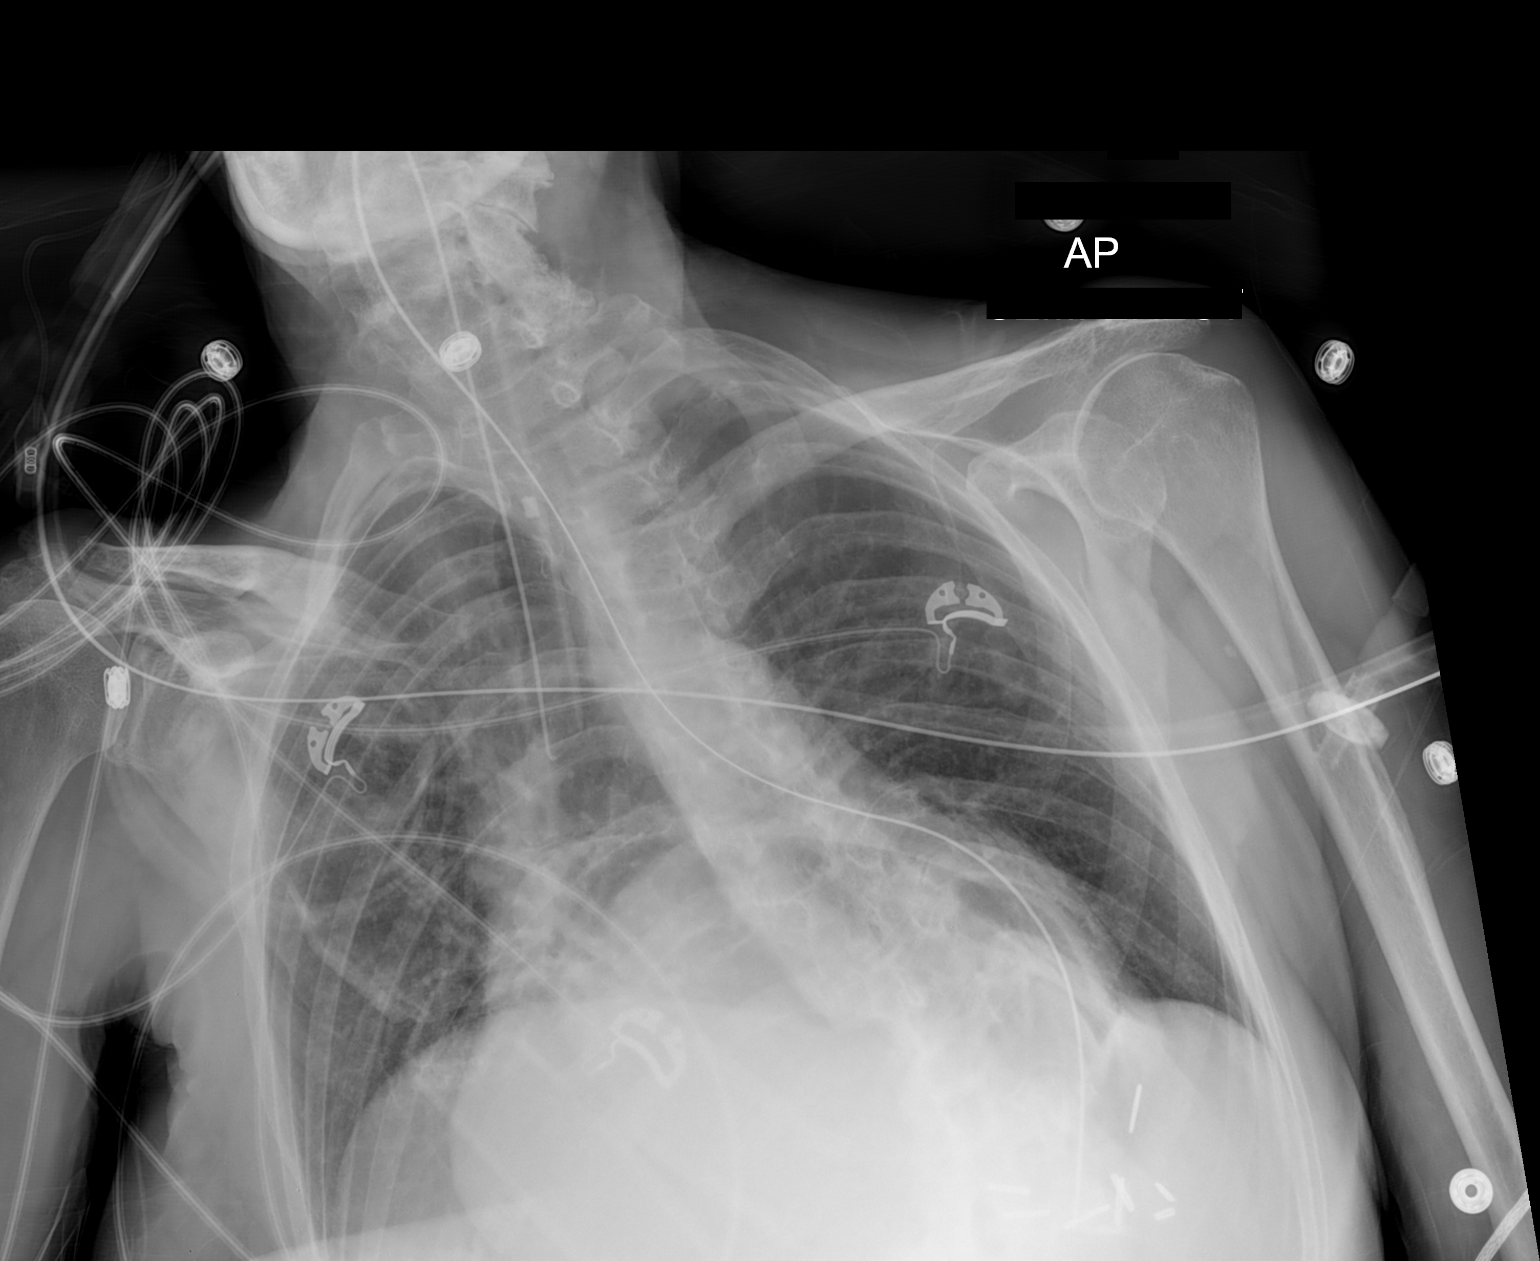

[1 of 1 positions shown; findings below may reference images not displayed]

FINDINGS: Endotracheal tube ends 1 cm above the carina. Gastric suction tube
is within a large hiatal hernia, which has been partly decompressed.
Probable cardiomegaly, although distorted by rightward rotation.

No visible edema or consolidation. No pneumothorax or effusion.
Levoscoliosis of the thoracolumbar spine.
IMPRESSION: 1. Low endotracheal tube, ending 1 cm above the carina.
2. Large hiatal hernia with indwelling orogastric tube.
3. Low volume lungs without edema or consolidation.

## 2015-01-05 IMAGING — CR DG CHEST 1V PORT
1 series · 1 of 1 positions shown · non-contrast
Comparison: CT 07/29/2013.  Chest x-ray 07/29/2013.

CLINICAL DATA: Respiratory failure.

EXAM:
PORTABLE CHEST - 1 VIEW

[AP]
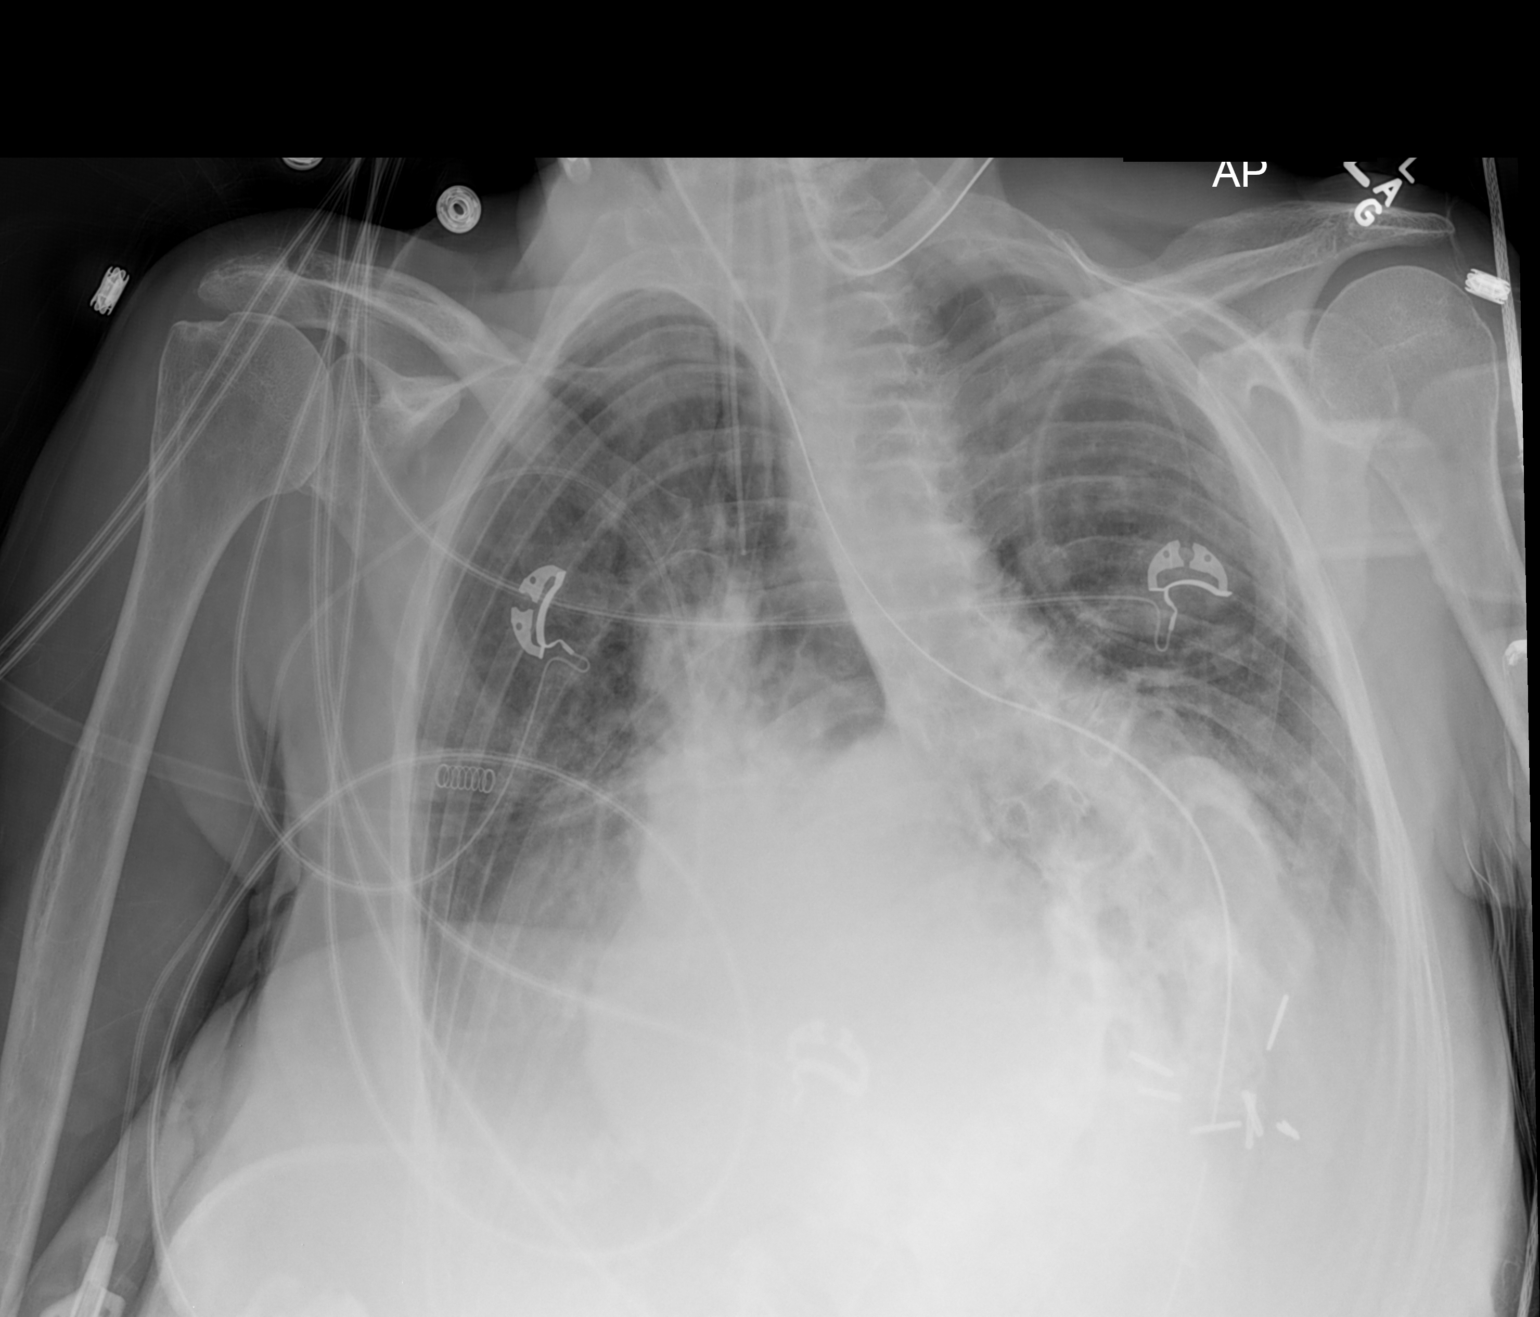

[1 of 1 positions shown; findings below may reference images not displayed]

FINDINGS: Endotracheal tube has been withdrawn, its tip is now in good
anatomic position 3 cm above the carina. NG tube noted projected
over the stomach. PICC line with tip projected over the right
atrium. There is a large sliding hiatal hernia again noted. Cardiac
structures are difficult to evaluate due to patient rotation and
large sliding hiatal hernia. Poor lung volumes. Small bilateral
pleural effusions. No pneumothorax. No acute osseous lesions.
Surgical clips left upper abdomen.
IMPRESSION: 1. Interim withdrawal of endotracheal tube. Its tip is in good
anatomic position 3 cm above the carina. PICC line and NG tube in
good anatomic position.
2. Poor lung volumes with small bilateral pleural effusions.
3. Large hiatal hernia.

## 2015-01-06 IMAGING — CR DG CHEST 1V PORT
1 series · 1 of 1 positions shown · non-contrast
Comparison: 07/30/2013

CLINICAL DATA: Endotracheal tube

EXAM:
PORTABLE CHEST - 1 VIEW

[AP]
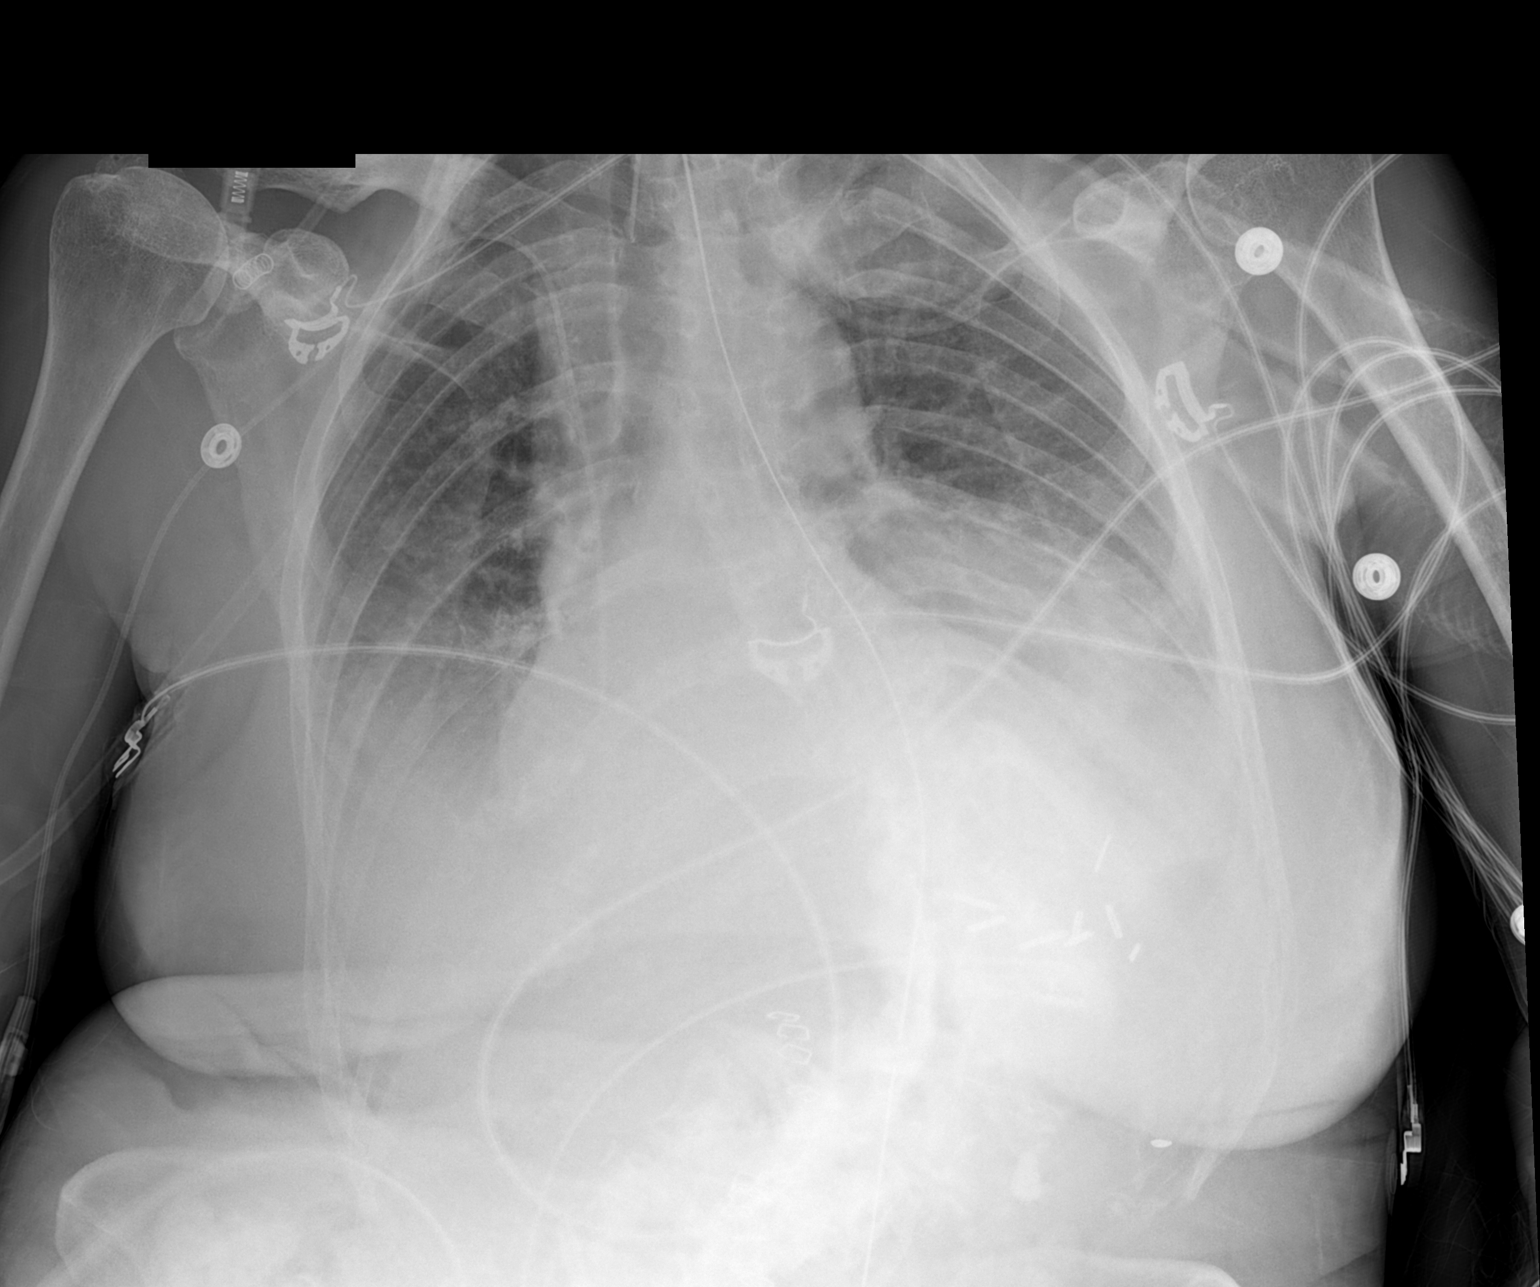

[1 of 1 positions shown; findings below may reference images not displayed]

FINDINGS: Endotracheal tube tip 6 cm above the carina.

Nasogastric tube courses below the diaphragm. Tip not imaged on the
current exam.

Right central line tip proximal right atrium level.

No gross pneumothorax.

Pulmonary edema with pleural effusions and basilar atelectasis
suspected.

Large hiatal hernia.

Cardiomegaly.

Tortuous aorta.

Postsurgical changes within the abdomen.
IMPRESSION: Endotracheal tube tip now 6 cm above the carina. Remainder of
findings without significant change as detailed above.

## 2015-01-20 ENCOUNTER — Other Ambulatory Visit: Payer: Self-pay | Admitting: Internal Medicine

## 2015-01-21 ENCOUNTER — Telehealth: Payer: Self-pay

## 2015-01-21 NOTE — Telephone Encounter (Signed)
Spoke with Dorene GrebeNatalie at [facility] [phone].  Did re- authorization for Lotronex 1mg  tablet, dx_ K58.9, IBS.  Approved thru July 24, 2015.  Contacted [facility] to let them know.

## 2015-02-26 ENCOUNTER — Encounter (HOSPITAL_COMMUNITY): Payer: Self-pay | Admitting: Emergency Medicine

## 2015-02-26 ENCOUNTER — Emergency Department (HOSPITAL_COMMUNITY)
Admission: EM | Admit: 2015-02-26 | Discharge: 2015-02-26 | Disposition: A | Discharge status: Home / Self Care | Payer: Medicare Other | Attending: Emergency Medicine | Admitting: Emergency Medicine

## 2015-02-26 ENCOUNTER — Emergency Department (HOSPITAL_COMMUNITY): Payer: Medicare Other

## 2015-02-26 DIAGNOSIS — Z7982 Long term (current) use of aspirin: Secondary | ICD-10-CM | POA: Insufficient documentation

## 2015-02-26 DIAGNOSIS — Z8709 Personal history of other diseases of the respiratory system: Secondary | ICD-10-CM | POA: Diagnosis not present

## 2015-02-26 DIAGNOSIS — R51 Headache: Secondary | ICD-10-CM | POA: Insufficient documentation

## 2015-02-26 DIAGNOSIS — I10 Essential (primary) hypertension: Secondary | ICD-10-CM | POA: Insufficient documentation

## 2015-02-26 DIAGNOSIS — Z79899 Other long term (current) drug therapy: Secondary | ICD-10-CM | POA: Insufficient documentation

## 2015-02-26 DIAGNOSIS — Z8669 Personal history of other diseases of the nervous system and sense organs: Secondary | ICD-10-CM | POA: Diagnosis not present

## 2015-02-26 DIAGNOSIS — K219 Gastro-esophageal reflux disease without esophagitis: Secondary | ICD-10-CM | POA: Insufficient documentation

## 2015-02-26 DIAGNOSIS — M4135 Thoracogenic scoliosis, thoracolumbar region: Secondary | ICD-10-CM | POA: Diagnosis not present

## 2015-02-26 DIAGNOSIS — Z87891 Personal history of nicotine dependence: Secondary | ICD-10-CM | POA: Diagnosis not present

## 2015-02-26 DIAGNOSIS — Z8742 Personal history of other diseases of the female genital tract: Secondary | ICD-10-CM | POA: Diagnosis not present

## 2015-02-26 DIAGNOSIS — R519 Headache, unspecified: Secondary | ICD-10-CM

## 2015-02-26 LAB — CBC WITH DIFFERENTIAL/PLATELET
BASOS PCT: 0 % (ref 0–1)
Basophils Absolute: 0 10*3/uL (ref 0.0–0.1)
Eosinophils Absolute: 0.1 10*3/uL (ref 0.0–0.7)
Eosinophils Relative: 3 % (ref 0–5)
HEMATOCRIT: 37.5 % (ref 36.0–46.0)
Hemoglobin: 12.4 g/dL (ref 12.0–15.0)
Lymphocytes Relative: 16 % (ref 12–46)
Lymphs Abs: 0.8 10*3/uL (ref 0.7–4.0)
MCH: 29.7 pg (ref 26.0–34.0)
MCHC: 33.1 g/dL (ref 30.0–36.0)
MCV: 89.9 fL (ref 78.0–100.0)
MONOS PCT: 10 % (ref 3–12)
Monocytes Absolute: 0.5 10*3/uL (ref 0.1–1.0)
NEUTROS ABS: 3.5 10*3/uL (ref 1.7–7.7)
Neutrophils Relative %: 71 % (ref 43–77)
Platelets: 192 10*3/uL (ref 150–400)
RBC: 4.17 MIL/uL (ref 3.87–5.11)
RDW: 12.2 % (ref 11.5–15.5)
WBC: 4.9 10*3/uL (ref 4.0–10.5)

## 2015-02-26 LAB — BASIC METABOLIC PANEL
ANION GAP: 5 (ref 5–15)
BUN: 21 mg/dL — AB (ref 6–20)
CHLORIDE: 108 mmol/L (ref 101–111)
CO2: 26 mmol/L (ref 22–32)
Calcium: 8.4 mg/dL — ABNORMAL LOW (ref 8.9–10.3)
Creatinine, Ser: 0.64 mg/dL (ref 0.44–1.00)
GFR calc Af Amer: >60 mL/min (ref >60)
GFR calc non Af Amer: >60 mL/min (ref >60)
Glucose, Bld: 145 mg/dL — ABNORMAL HIGH (ref 65–99)
POTASSIUM: 4.1 mmol/L (ref 3.5–5.1)
Sodium: 139 mmol/L (ref 135–145)

## 2015-02-26 NOTE — ED Notes (Signed)
Pt states she had a sharp pain in R side of head, began last night at 9:30 pm, states pain is constant.  Denies any other symptoms, denies fever.

## 2015-02-26 NOTE — Discharge Instructions (Signed)
Tylenol or motrin for pain.   Follow up with your md next week if any problems

## 2015-02-26 NOTE — ED Provider Notes (Addendum)
CSN: [id number]     Arrival date & time 02/26/15  1118 History   First MD Initiated Contact with Patient 02/26/15 1127     Chief Complaint  Patient presents with  . Headache    pain in R side of head, sharp     (Consider location/radiation/quality/duration/timing/severity/associated sxs/prior Treatment) Patient is a 74 y.o. female presenting with headaches. The history is provided by the patient (pt complains of a headache to right side of head).  Headache Pain location:  R parietal Quality:  Dull Radiates to:  Does not radiate Severity currently:  8/10 Severity at highest:  0/10 Onset quality:  Sudden Timing:  Constant Chronicity:  New Context: not activity   Associated symptoms: no abdominal pain, no back pain, no congestion, no cough, no fatigue, no seizures and no sinus pressure     Past Medical History  Diagnosis Date  . Glaucoma   . Dry eye syndrome   . GI bleed   . Diverticulosis   . GERD (gastroesophageal reflux disease)   . Thoracogenic scoliosis of thoracolumbar region     SEVERE  . Chronic pancreatitis   . IBS (irritable bowel syndrome)   . Pelvic floor dysfunction     and rectalprolaspe  . Osteoporosis   . Internal hemorrhoids   . Hypertension   . Dry mouth   . Blood transfusion     HEMORRHAGED  1 WEEK AFTER COLONOSCOPY  -REQUIRED TRANSFUSION  . Abscessed tooth     STATES TOOTH PULLED RIGHT LOWER MOLAR   AND LEFT LOWER MOLAR ON3/25/13 AND PT ON ANTIBIOTIC  . Hiatal hernia     LARGE HIATAL HERNIA PER CXR REPORT  . 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 Bladder spasms   . Overactive bladder   . Volvulus 08/04/2013  . Acute respiratory failure with hypoxia 07/28/2013  . Pancreatitis     past history   . Glaucoma    Past Surgical History  Procedure Laterality Date  . Nissen fundoplication    . Vaginal hysterectomy  2002    A&P repair/bladder sling  .  Cataract extraction      bilateral  . Band hemorrhoidectomy    . Mouth surgery    . Colonoscopy  02/03/2008    diverticulosis, internal hemorrhoids  . Upper gastrointestinal endoscopy  02/12/2008    hiatal henia  . Flexible sigmoidoscopy  11/11/2008    internal hemorrhoids  . Hiatal hernia repair    . Mouth surgery    . Eye surgery      LASER OF BOTH EYES  FOR ELEVATED PRESSURE  . Bladder suspension  2002    Grapey - w/ hysty  . Staple hemorrhoidectomy    . Esophagogastroduodenoscopy N/A 07/28/2013    Procedure: ESOPHAGOGASTRODUODENOSCOPY (EGD);  Surgeon: Rachael Fee, MD;  Location: [facility];  Service: Endoscopy;  Laterality: N/A;  . Esophagogastroduodenoscopy N/A 07/28/2013    Procedure: ESOPHAGOGASTRODUODENOSCOPY (EGD);  Surgeon: Rachael Fee, MD;  Location: [facility];  Service: Endoscopy;  Laterality: N/A;  . Hiatal hernia repair N/A 07/30/2013    Procedure: Exploratory Laparotomy, Lysis of Adhesions, Reduction of Hiatal Hernia, Repair of Esophogeal Perforations, Dor Fundal Plication, Evacuation of Gastric Bezoar, Gastrostomy Tube Insertion, Upper Endoscopy by Dr. [name];  Surgeon: Adolph Pollack, MD;  Location: [facility];  Service: General;  Laterality: N/A;  . Gastrostomy tube placement  07/30/13   Family History  Problem Relation Age of Onset  . Heart disease Sister   . Liver cancer Sister   . Stroke Father   . Liver cancer Sister   . Brain cancer Sister   . Colon cancer Neg Hx   . Kidney disease Mother   . Lung cancer Brother   . Heart disease Brother     Atrial fibrillation   History  Substance Use Topics  . Smoking status: Former Smoker    Types: Cigarettes  . Smokeless tobacco: Never Used     Comment: ONLY SMOKED AS A TEEN  . Alcohol Use: No   OB History    No data available     Review of Systems  Constitutional: Negative for appetite change and fatigue.  HENT: Negative for congestion, ear discharge and sinus pressure.   Eyes: Negative for  discharge.  Respiratory: Negative for cough.   Cardiovascular: Negative for chest pain.  Gastrointestinal: Negative for abdominal pain.  Genitourinary: Negative for frequency and hematuria.  Musculoskeletal: Negative for back pain.  Skin: Negative for rash.  Neurological: Positive for headaches. Negative for seizures.  Psychiatric/Behavioral: Negative for hallucinations.      Allergies  Codeine  Home Medications   Prior to Admission medications   Medication Sig Start Date End Date Taking? Authorizing Provider  acetaminophen (TYLENOL) 500 MG tablet Take 500-1,000 mg by mouth every 6 (six) hours as needed for moderate pain or headache.   Yes Historical Provider, MD  alosetron (LOTRONEX) 1 MG tablet Take 1 tablet (1 mg total) by mouth 2 (two) times daily as needed. For diarrhea Patient taking differently: Take 1 mg by mouth every morning. For diarrhea 08/30/14  Yes Iva Boop, MD  amLODipine-benazepril (LOTREL) 5-20 MG per capsule Take 1 capsule by mouth daily after breakfast. Patient took at home prior to arrival to Short Stay   Yes Historical Provider, MD  aspirin 81 MG tablet Take 81 mg by mouth daily after breakfast.    Yes Historical Provider, MD  carboxymethylcellulose (REFRESH PLUS) 0.5 % SOLN Place 1 drop into both eyes 4 (four) times daily.    Yes Historical Provider, MD  cholecalciferol (VITAMIN D) 400 UNITS TABS tablet Take 400 Units by mouth daily.   Yes Historical Provider, MD  CREON 24000 UNITS CPEP TAKE 3 CAPSULES BY MOUTH 3 TIMES A DAY WITH MEALS 01/21/15  Yes Iva Boop, MD  darifenacin (ENABLEX) 15 MG 24 hr tablet Take 15 mg by mouth at bedtime.   Yes Historical Provider, MD  denosumab (PROLIA) 60 MG/ML SOLN injection Inject 60 mg into the skin every 6 (six) months. Administer in upper arm, thigh, or abdomen   Yes Historical Provider, MD  loperamide (IMODIUM) 2 MG capsule Take 2 mg by mouth every morning.  08/14/13  Yes Sherrie George, PA-C  Multiple  Vitamins-Minerals (CENTRUM ULTRA WOMENS) TABS Take 1 tablet by mouth daily.   Yes Historical Provider, MD  omeprazole (PRILOSEC) 20 MG capsule TAKE ONE CAPSULE BY MOUTH EVERY MORNING WITH BREAKFAST 09/30/14  Yes Iva Boop, MD  Tafluprost (ZIOPTAN) 0.0015 % SOLN Apply 1 drop to eye at bedtime.    Yes Historical Provider, MD  timolol (BETIMOL) 0.5 % ophthalmic solution Place 1 drop into both eyes 2 (two) times daily.   Yes Historical Provider, MD  Vitamin D, Ergocalciferol, (DRISDOL) 50000 UNITS CAPS Take 50,000 Units by mouth every 14 (fourteen) days.    Yes Historical Provider, MD  Wheat Dextrin (BENEFIBER) TABS Take 2 tablets by mouth 2 (  two) times daily.   Yes Historical Provider, MD  acetaminophen (TYLENOL) 325 MG tablet Take 1-2 tablets (325-650 mg total) by mouth every 6 (six) hours as needed for fever, headache, mild pain or moderate pain. Patient not taking: Reported on 02/26/2015 08/14/13   Sherrie George, PA-C  CREON 24000 UNITS CPEP TAKE 3 CAPSULES BY MOUTH 3 TIMES A DAY WITH MEALS Patient not taking: Reported on 02/26/2015 01/21/15   Iva Boop, MD  ferrous sulfate 325 (65 FE) MG tablet Take 1 tablet (325 mg total) by mouth 3 (three) times daily with meals. Patient not taking: Reported on 02/26/2015 08/14/13   Sherrie George, PA-C  ondansetron (ZOFRAN) 4 MG tablet Take 1 tablet (4 mg total) by mouth every 6 (six) hours as needed for nausea or vomiting. Patient not taking: Reported on 02/26/2015 08/14/13   Sherrie George, PA-C  saccharomyces boulardii (FLORASTOR) 250 MG capsule Take 1 capsule (250 mg total) by mouth 2 (two) times daily. Patient not taking: Reported on 02/26/2015 08/14/13   Sherrie George, PA-C   BP 108/75 mmHg  Pulse 92  Temp(Src) 98.2 F (36.8 C) (Oral)  Resp 16  SpO2 97% Physical Exam  Constitutional: She is oriented to person, place, and time. She appears well-developed.  HENT:  Head: Normocephalic.  Eyes: Conjunctivae and EOM are normal. No scleral  icterus.  Neck: Neck supple. No thyromegaly present.  Cardiovascular: Normal rate and regular rhythm.  Exam reveals no gallop and no friction rub.   No murmur heard. Pulmonary/Chest: No stridor. She has no wheezes. She has no rales. She exhibits no tenderness.  Abdominal: She exhibits no distension. There is no tenderness. There is no rebound.  Musculoskeletal: Normal range of motion. She exhibits no edema.  Lymphadenopathy:    She has no cervical adenopathy.  Neurological: She is oriented to person, place, and time. She exhibits normal muscle tone. Coordination normal.  Skin: No rash noted. No erythema.  Psychiatric: She has a normal mood and affect. Her behavior is normal.    ED Course  Procedures (including critical care time) Labs Review Labs Reviewed  BASIC METABOLIC PANEL - Abnormal; Notable for the following:    Glucose, Bld 145 (*)    BUN 21 (*)    Calcium 8.4 (*)    All other components within normal limits  CBC WITH DIFFERENTIAL/PLATELET    Imaging Review Ct Head Wo Contrast  02/26/2015   CLINICAL DATA:  Right-sided headache beginning last night 9:30 p.m.  EXAM: CT HEAD WITHOUT CONTRAST  TECHNIQUE: Contiguous axial images were obtained from the base of the skull through the vertex without intravenous contrast.  COMPARISON:  MRI brain 05/05/2009.  No prior head CT for comparison.  FINDINGS: Mild cortical volume loss noted with proportional ventricular prominence. Areas of periventricular white matter hypodensity are most compatible with small vessel ischemic change. No acute hemorrhage, infarct, or mass lesion is identified. Chronic left maxillary sinusitis. No skull fracture.  IMPRESSION: Chronic findings as above without acute intracranial abnormality.  Chronic left maxillary sinusitis.   Electronically Signed   By: Christiana Pellant M.D.   On: 02/26/2015 12:39     EKG Interpretation None      MDM   Final diagnoses:  Headache above the eye region    Headache  resolved    Bethann Berkshire, MD 02/26/15 1448  Bethann Berkshire, MD 03/01/15 1422

## 2015-03-05 ENCOUNTER — Other Ambulatory Visit: Payer: Self-pay | Admitting: Internal Medicine

## 2015-03-15 ENCOUNTER — Other Ambulatory Visit: Payer: Self-pay | Admitting: Internal Medicine

## 2015-03-29 ENCOUNTER — Ambulatory Visit: Payer: Medicare Other | Admitting: Internal Medicine

## 2015-03-31 ENCOUNTER — Ambulatory Visit: Payer: Medicare Other | Admitting: Internal Medicine

## 2015-04-13 ENCOUNTER — Other Ambulatory Visit: Payer: Self-pay | Admitting: Internal Medicine

## 2015-05-04 ENCOUNTER — Encounter: Payer: Self-pay | Admitting: Internal Medicine

## 2015-05-04 ENCOUNTER — Ambulatory Visit (INDEPENDENT_AMBULATORY_CARE_PROVIDER_SITE_OTHER): Payer: Medicare Other | Admitting: Internal Medicine

## 2015-05-04 VITALS — BP 116/70 | HR 84 | Ht <= 58 in | Wt 98.4 lb

## 2015-05-04 DIAGNOSIS — K589 Irritable bowel syndrome without diarrhea: Secondary | ICD-10-CM | POA: Diagnosis not present

## 2015-05-04 DIAGNOSIS — K861 Other chronic pancreatitis: Secondary | ICD-10-CM | POA: Diagnosis not present

## 2015-05-04 DIAGNOSIS — K219 Gastro-esophageal reflux disease without esophagitis: Secondary | ICD-10-CM

## 2015-05-04 MED ORDER — OMEPRAZOLE 20 MG PO CPDR: 20.0000 mg | DELAYED_RELEASE_CAPSULE | Freq: Every day | ORAL | Status: DC

## 2015-05-04 MED ORDER — ALOSETRON HCL 1 MG PO TABS: 1.0000 mg | ORAL_TABLET | Freq: Two times a day (BID) | ORAL | Status: DC

## 2015-05-04 MED ORDER — PANCRELIPASE (LIP-PROT-AMYL) 24000-76000 UNITS PO CPEP: 3.0000 | ORAL_CAPSULE | Freq: Three times a day (TID) | ORAL | Status: DC

## 2015-05-04 NOTE — Assessment & Plan Note (Signed)
Refill Creon

## 2015-05-04 NOTE — Assessment & Plan Note (Signed)
Refill PPI 

## 2015-05-04 NOTE — Progress Notes (Signed)
   Subjective:    Patient ID: [name], female    DOB: 08/18/1941, 74 y.o.   MRN: [id number] Cc: F/U IBS, GERD and pancreatic insufficiency HPI Overall doing well w/ IBS Still w a lot of gas  Sometimes loose stools - too many peanuts or potatoes, carrots and onions together but not separate "Chewable Benefiber makes all the difference in the world"  No GERD Sxs on omeprazole  No signs of steatorrhea re: pancreatic insufficiency/chronic pancreatitis Wt Readings from Last 3 Encounters:  05/04/15 98 lb 6 oz (44.623 kg)  12/28/13 96 lb 8 oz (43.772 kg)  11/02/13 97 lb 2 oz (44.056 kg)   Began walking and is up to 45 mins/day, much more energy and sense of well-being Review of Systems As above    Objective:   Physical Exam BP 116/70 mmHg  Pulse 84  Ht  (1.422 m)  Wt 98 lb 6 oz (44.623 kg)  BMI 22.07 kg/m2 Elderly - kyphoscoliosis, NAD     Assessment & Plan:  Irritable bowel syndrome Refill Lotronex, stay on Benefiber RTC 1 year sooner prn  Chronic pancreatitis Refill Creon  GERD Refill PPI    15 minutes time spent with patient > half in counseling coordination of care   Current outpatient prescriptions:  .  acetaminophen (TYLENOL) 500 MG tablet, Take 500-1,000 mg by mouth every 6 (six) hours as needed for moderate pain or headache., Disp: , Rfl:  .  alosetron (LOTRONEX) 1 MG tablet, Take 1 tablet (1 mg total) by mouth 2 (two) times daily., Disp: 60 tablet, Rfl: 11 .  amLODipine-benazepril (LOTREL) 5-20 MG per capsule, Take 1 capsule by mouth daily after breakfast. Patient took at home prior to arrival to Short Stay, Disp: , Rfl:  .  aspirin 81 MG tablet, Take 81 mg by mouth daily after breakfast. , Disp: , Rfl:  .  carboxymethylcellulose (REFRESH PLUS) 0.5 % SOLN, Place 1 drop into both eyes 4 (four) times daily. , Disp: , Rfl:  .  cholecalciferol (VITAMIN D) 400 UNITS TABS tablet, Take 400 Units by mouth daily., Disp: , Rfl:  .  darifenacin (ENABLEX) 15 MG 24  hr tablet, Take 15 mg by mouth at bedtime., Disp: , Rfl:  .  denosumab (PROLIA) 60 MG/ML SOLN injection, Inject 60 mg into the skin every 6 (six) months. Administer in upper arm, thigh, or abdomen, Disp: , Rfl:  .  loperamide (IMODIUM) 2 MG capsule, Take 2 mg by mouth every morning. , Disp: , Rfl:  .  Multiple Vitamins-Minerals (CENTRUM ULTRA WOMENS) TABS, Take 1 tablet by mouth daily., Disp: , Rfl:  .  omeprazole (PRILOSEC) 20 MG capsule, Take 1 capsule (20 mg total) by mouth daily., Disp: 90 capsule, Rfl: 3 .  Pancrelipase, Lip-Prot-Amyl, (CREON) 24000 UNITS CPEP, Take 3 capsules (72,000 Units total) by mouth 3 (three) times daily with meals. 1 w/ snack, Disp: 360 capsule, Rfl: 11 .  Tafluprost (ZIOPTAN) 0.0015 % SOLN, Apply 1 drop to eye at bedtime. , Disp: , Rfl:  .  timolol (BETIMOL) 0.5 % ophthalmic solution, Place 1 drop into both eyes 2 (two) times daily., Disp: , Rfl:  .  Vitamin D, Ergocalciferol, (DRISDOL) 50000 UNITS CAPS, Take 50,000 Units by mouth every 14 (fourteen) days. , Disp: , Rfl:  .  Wheat Dextrin (BENEFIBER) TABS, Take 2 tablets by mouth 2 (two) times daily., Disp: , Rfl:   I appreciate the opportunity to care for this patient. HQ:IONGE,XBMWU RUTH, MD

## 2015-05-04 NOTE — Assessment & Plan Note (Signed)
Refill Lotronex, stay on Benefiber RTC 1 year sooner prn

## 2015-05-04 NOTE — Patient Instructions (Addendum)
We have sent the following medications to your pharmacy for you to pick up at your convenience: Creon Lotronex Prilosec  Please follow up in 1 year, We are pleased that you are doing well at this time.  I appreciate the opportunity to care for you. Iva Boop, MD, [facility]

## 2015-05-19 ENCOUNTER — Other Ambulatory Visit: Payer: Self-pay | Admitting: Internal Medicine

## 2015-08-03 ENCOUNTER — Other Ambulatory Visit: Payer: Self-pay | Admitting: Internal Medicine

## 2015-08-19 ENCOUNTER — Telehealth: Payer: Self-pay

## 2015-08-19 NOTE — Telephone Encounter (Signed)
Called [facility] [phone] to do a prior authorization for Lotronex 1mg  tablet, dx: K58.9 IBS,  Per Marylene LandAngela approved thru 02/17/2016 , Ref # [id number].  They will notify patient.  I notified [facility] of approval.

## 2015-08-25 ENCOUNTER — Other Ambulatory Visit: Payer: Self-pay | Admitting: Internal Medicine

## 2015-10-17 ENCOUNTER — Other Ambulatory Visit: Payer: Self-pay | Admitting: Internal Medicine

## 2015-11-11 ENCOUNTER — Other Ambulatory Visit: Payer: Self-pay | Admitting: Internal Medicine

## 2015-11-17 ENCOUNTER — Other Ambulatory Visit: Payer: Self-pay | Admitting: Internal Medicine

## 2016-01-24 ENCOUNTER — Encounter: Payer: Self-pay | Admitting: Internal Medicine

## 2016-03-05 ENCOUNTER — Telehealth: Payer: Self-pay

## 2016-03-05 NOTE — Telephone Encounter (Signed)
Did prior authorization for Lotronex over the phone with [facility] [phone], Dx K58.9 IBS,  Approved thru 09-04-16. [facility] informed.

## 2016-05-27 ENCOUNTER — Other Ambulatory Visit: Payer: Self-pay | Admitting: Internal Medicine

## 2016-06-23 ENCOUNTER — Other Ambulatory Visit: Payer: Self-pay | Admitting: Internal Medicine

## 2016-07-24 ENCOUNTER — Other Ambulatory Visit: Payer: Self-pay | Admitting: Internal Medicine

## 2016-09-18 ENCOUNTER — Ambulatory Visit: Payer: Medicare Other | Admitting: Internal Medicine

## 2017-02-05 ENCOUNTER — Other Ambulatory Visit: Payer: Self-pay | Admitting: Internal Medicine

## 2017-04-21 ENCOUNTER — Other Ambulatory Visit: Payer: Self-pay | Admitting: Internal Medicine

## 2017-05-29 ENCOUNTER — Other Ambulatory Visit: Payer: Self-pay | Admitting: Internal Medicine

## 2017-06-23 ENCOUNTER — Other Ambulatory Visit: Payer: Self-pay | Admitting: Internal Medicine

## 2017-07-14 ENCOUNTER — Other Ambulatory Visit: Payer: Self-pay | Admitting: Internal Medicine

## 2017-07-30 ENCOUNTER — Encounter (INDEPENDENT_AMBULATORY_CARE_PROVIDER_SITE_OTHER): Payer: Self-pay

## 2017-07-30 ENCOUNTER — Ambulatory Visit: Payer: Medicare Other | Admitting: Internal Medicine

## 2017-07-30 ENCOUNTER — Encounter: Payer: Self-pay | Admitting: Internal Medicine

## 2017-07-30 VITALS — BP 100/58 | HR 106 | Ht <= 58 in | Wt 111.0 lb

## 2017-07-30 DIAGNOSIS — K580 Irritable bowel syndrome with diarrhea: Secondary | ICD-10-CM

## 2017-07-30 DIAGNOSIS — R1012 Left upper quadrant pain: Secondary | ICD-10-CM | POA: Diagnosis not present

## 2017-07-30 DIAGNOSIS — K861 Other chronic pancreatitis: Secondary | ICD-10-CM

## 2017-07-30 DIAGNOSIS — E46 Unspecified protein-calorie malnutrition: Secondary | ICD-10-CM | POA: Diagnosis not present

## 2017-07-30 MED ORDER — PANCRELIPASE (LIP-PROT-AMYL) 24000-76000 UNITS PO CPEP: 3.0000 | ORAL_CAPSULE | Freq: Three times a day (TID) | ORAL | 11 refills | Status: DC

## 2017-07-30 MED ORDER — ALOSETRON HCL 1 MG PO TABS: 1.0000 mg | ORAL_TABLET | Freq: Two times a day (BID) | ORAL | 11 refills | Status: DC

## 2017-07-30 MED ORDER — OMEPRAZOLE 20 MG PO CPDR: 20.0000 mg | DELAYED_RELEASE_CAPSULE | Freq: Every day | ORAL | 3 refills | Status: DC

## 2017-07-30 NOTE — Patient Instructions (Signed)
Follow up with Dr Leone PayorGessner in 2 years or sooner if needed.   We have sent the following medications to your pharmacy for you to pick up at your convenience: Lotronex, omeprazole, creon   I appreciate the opportunity to care for you. Stan Headarl Gessner, MD, [facility]

## 2017-07-30 NOTE — Progress Notes (Signed)
Angela FredericksonJean F [name] 76 y.o. 06/09/1941 [id number]  Assessment & Plan:   Encounter Diagnoses  Name Primary?  . Irritable bowel syndrome with diarrhea Yes  . Idiopathic chronic pancreatitis (HCC)   . Protein-calorie malnutrition, unspecified severity (HCC) improved   . LUQ pain    She seems to be doing well overall and all things considered.  I will refill her omeprazole, Lotronex and pancreatic enzyme replacement with Creon. Her protein calorie malnutrition is much better.  She may return to see me in 2 years, sooner as needed.  I have okayed a refill of medications in between as long as she is stable  I appreciate the opportunity to care for this patient. CC: [name], Donna, MD   Subjective:   Chief Complaint: Follow-up of irritable bowel syndrome gastroesophageal reflux disease and pancreatic insufficiency last seen August 2016  HPI Angela BernJean is doing well she has gained 13 pounds since August 2016.  Irritable bowel syndrome diarrhea pancreatic insufficiency are under control.  She is complaining of some left upper quadrant pain when she sits.  She is due for her wellness visit with Dr. Kevan NyGates tomorrow and is looking forward to having Thanksgiving after that.  She is cooking a [place].  She would like refills on her Lotronex, Creon and omeprazole.  She does not have any problems with GERD either.  Wt Readings from Last 3 Encounters:  07/30/17 111 lb (50.3 kg)  05/04/15 98 lb 6 oz (44.6 kg)  12/28/13 96 lb 8 oz (43.8 kg)    Allergies  Allergen Reactions  . Codeine     REACTION: Dlizzy   Current Meds  Medication Sig  . acetaminophen (TYLENOL) 500 MG tablet Take 500-1,000 mg by mouth every 6 (six) hours as needed for moderate pain or headache.  Marland Kitchen. amLODipine-benazepril (LOTREL) 5-20 MG per capsule Take 1 capsule by mouth daily after breakfast. Patient took at home prior to arrival to Short Stay  . aspirin 81 MG tablet Take 81 mg by mouth daily after breakfast.   . carboxymethylcellulose  (REFRESH PLUS) 0.5 % SOLN Place 1 drop into both eyes 4 (four) times daily.   . cholecalciferol (VITAMIN D) 400 UNITS TABS tablet Take 400 Units by mouth daily.  Marland Kitchen. denosumab (PROLIA) 60 MG/ML SOLN injection Inject 60 mg into the skin every 6 (six) months. Administer in upper arm, thigh, or abdomen  . loperamide (IMODIUM) 2 MG capsule Take 2 mg by mouth every morning.   Marland Kitchen. LOTRONEX 1 MG tablet TAKE 1 TABLET (1 MG TOTAL) BY MOUTH 2 (TWO) TIMES DAILY.  . Multiple Vitamins-Minerals (CENTRUM ULTRA WOMENS) TABS Take 1 tablet by mouth daily.  Marland Kitchen. omeprazole (PRILOSEC) 20 MG capsule TAKE 1 CAPSULE (20 MG TOTAL) BY MOUTH DAILY.  Marland Kitchen. Pancrelipase, Lip-Prot-Amyl, (CREON) 24000 UNITS CPEP Take 3 capsules (72,000 Units total) by mouth 3 (three) times daily with meals. 1 w/ snack  . pravastatin (PRAVACHOL) 20 MG tablet Take 20 mg by mouth daily.  . Tafluprost (ZIOPTAN) 0.0015 % SOLN Apply 1 drop to eye at bedtime.   . timolol (BETIMOL) 0.5 % ophthalmic solution Place 1 drop into both eyes 2 (two) times daily.  . Vitamin D, Ergocalciferol, (DRISDOL) 50000 UNITS CAPS Take 50,000 Units by mouth every 14 (fourteen) days.   . Wheat Dextrin (BENEFIBER) TABS Take 2 tablets by mouth 2 (two) times daily.   Past Medical History:  Diagnosis Date  . Abscessed tooth    STATES TOOTH PULLED RIGHT LOWER MOLAR   AND LEFT  LOWER MOLAR ON3/25/13 AND PT ON ANTIBIOTIC  . Acute respiratory failure with hypoxia (HCC) 07/28/2013  . Bladder spasms   . Blood transfusion    HEMORRHAGED  1 WEEK AFTER COLONOSCOPY  -REQUIRED TRANSFUSION  . Chronic pancreatitis (HCC)   . Diverticulosis   . Dry eye syndrome   . Dry mouth   . 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 GERD (gastroesophageal reflux disease)   . GI bleed   . Glaucoma   . Glaucoma   . Hiatal hernia    LARGE HIATAL HERNIA PER CXR REPORT  . Hypertension   . IBS (irritable bowel  syndrome)   . Internal hemorrhoids   . Osteoporosis   . Overactive bladder   . Pancreatitis    past history   . Pelvic floor dysfunction    and rectalprolaspe  . Thoracogenic scoliosis of thoracolumbar region    SEVERE  . Volvulus (HCC) 08/04/2013   Past Surgical History:  Procedure Laterality Date  . BAND HEMORRHOIDECTOMY    . BLADDER SUSPENSION  2002   Grapey - w/ hysty  . CATARACT EXTRACTION     bilateral  . COLONOSCOPY  02/03/2008   diverticulosis, internal hemorrhoids  . ESOPHAGOGASTRODUODENOSCOPY N/A 07/28/2013   Procedure: ESOPHAGOGASTRODUODENOSCOPY (EGD);  Surgeon: Rachael Feeaniel P Jacobs, MD;  [facility];  Service: Endoscopy;  Laterality: N/A;  . ESOPHAGOGASTRODUODENOSCOPY N/A 07/28/2013   Procedure: ESOPHAGOGASTRODUODENOSCOPY (EGD);  Surgeon: Rachael Feeaniel P Jacobs, MD;  [facility];  Service: Endoscopy;  Laterality: N/A;  . EYE SURGERY     LASER OF BOTH EYES  FOR ELEVATED PRESSURE  . FLEXIBLE SIGMOIDOSCOPY  11/11/2008   internal hemorrhoids  . GASTROSTOMY TUBE PLACEMENT  07/30/13  . HIATAL HERNIA REPAIR    . HIATAL HERNIA REPAIR N/A 07/30/2013   Procedure: Exploratory Laparotomy, Lysis of Adhesions, Reduction of Hiatal Hernia, Repair of Esophogeal Perforations, Dor Fundal Plication, Evacuation of Gastric Bezoar, Gastrostomy Tube Insertion, Upper Endoscopy by Dr. Corliss Skainssuei;  Surgeon: Adolph Pollackodd J Rosenbower, MD;  Location: [facility];  Service: General;  Laterality: N/A;  . MOUTH SURGERY    . MOUTH SURGERY    . NISSEN FUNDOPLICATION    . STAPLE HEMORRHOIDECTOMY    . UPPER GASTROINTESTINAL ENDOSCOPY  02/12/2008   hiatal henia  . VAGINAL HYSTERECTOMY  2002   A&P repair/bladder sling    family history includes Brain cancer in her sister; Heart disease in her brother and sister; Kidney disease in her mother; Liver cancer in her sister and sister; Lung cancer in her brother; Stroke in her father.   Review of Systems As per HPI  Objective:   Physical Exam BP (!) 100/58    Pulse (!) 106   Ht 4\' 7"  (1.397 m)   Wt 111 lb (50.3 kg)   BMI 25.80 kg/m  Eyes are anicteric There is kyphoscoliosis with a prominent kyphotic change in the left chest area posteriorly especially. The abdomen is soft her left anterior ribs pushed down and pinch into her body when she sits.  This creates the tenderness she is speaking of. She is alert and oriented x3

## 2017-07-31 ENCOUNTER — Telehealth: Payer: Self-pay

## 2017-07-31 NOTE — Telephone Encounter (Signed)
Did covermymeds to get a re-authorization done for patients alosetron 1 mg tablets that she takes for her IBS with diarrhea. DX K58.9.  Approved thru 01/28/2018. [facility] notified.

## 2017-08-21 ENCOUNTER — Other Ambulatory Visit: Payer: Self-pay | Admitting: Internal Medicine

## 2018-01-02 ENCOUNTER — Encounter: Payer: Self-pay | Admitting: Internal Medicine

## 2018-02-14 ENCOUNTER — Telehealth: Payer: Self-pay

## 2018-02-14 NOTE — Telephone Encounter (Signed)
Did a re-authorization for patients Alosetron 1mg  tablets, one BID for patients IBS-D, K58.9.  It has been approved thru 08/16/2018. [facility] informed.

## 2018-07-18 ENCOUNTER — Telehealth: Payer: Self-pay

## 2018-07-18 NOTE — Telephone Encounter (Signed)
Called [facility] at [phone] to do a re-authorization for patients alosetron 1mg  tablet for her IBS with diarrhea, K58.9. It has been approved thru 02/16/2019.

## 2018-08-03 ENCOUNTER — Other Ambulatory Visit: Payer: Self-pay | Admitting: Internal Medicine

## 2018-08-12 ENCOUNTER — Ambulatory Visit
Admission: RE | Admit: 2018-08-12 | Discharge: 2018-08-12 | Disposition: A | Discharge status: Home / Self Care | Payer: Medicare Other | Source: Ambulatory Visit | Attending: Family Medicine | Admitting: Family Medicine

## 2018-08-12 ENCOUNTER — Other Ambulatory Visit: Payer: Self-pay | Admitting: Family Medicine

## 2018-08-12 DIAGNOSIS — M25469 Effusion, unspecified knee: Secondary | ICD-10-CM

## 2018-08-12 DIAGNOSIS — M24469 Recurrent dislocation, unspecified knee: Secondary | ICD-10-CM

## 2018-08-12 DIAGNOSIS — R238 Other skin changes: Secondary | ICD-10-CM

## 2018-08-12 DIAGNOSIS — W19XXXA Unspecified fall, initial encounter: Secondary | ICD-10-CM

## 2018-08-12 DIAGNOSIS — M25562 Pain in left knee: Secondary | ICD-10-CM

## 2018-08-13 ENCOUNTER — Other Ambulatory Visit: Payer: Self-pay | Admitting: Internal Medicine

## 2018-08-28 ENCOUNTER — Ambulatory Visit: Payer: Medicare Other | Attending: Family Medicine | Admitting: Physical Therapy

## 2018-08-28 ENCOUNTER — Encounter: Payer: Self-pay | Admitting: Physical Therapy

## 2018-08-28 ENCOUNTER — Other Ambulatory Visit: Payer: Self-pay

## 2018-08-28 DIAGNOSIS — M25562 Pain in left knee: Secondary | ICD-10-CM

## 2018-08-28 DIAGNOSIS — M6281 Muscle weakness (generalized): Secondary | ICD-10-CM | POA: Diagnosis present

## 2018-08-28 DIAGNOSIS — R262 Difficulty in walking, not elsewhere classified: Secondary | ICD-10-CM

## 2018-08-28 DIAGNOSIS — R279 Unspecified lack of coordination: Secondary | ICD-10-CM | POA: Diagnosis present

## 2018-08-28 NOTE — Patient Instructions (Signed)
Access Code: [email or web address]  URL: [email or web address]  Date: 08/28/2018  Prepared by: Lavinia SharpsStacy Mehtab Dolberry   Exercises  Seated Long Arc Quad - 10 reps - 2 sets - 1x daily - 7x weekly  Seated March - 10 reps - 2 sets - 1x daily - 7x weekly  Seated Heel Raise - 10 reps - 2 sets - 1x daily - 7x weekly  Seated Toe Raise - 10 reps - 2 sets - 1x daily - 7x weekly

## 2018-08-28 NOTE — Therapy (Signed)
[facility] [address], [address] [place], [place], [address] Phone: [phone]   Fax:  [phone]  Physical Therapy Evaluation  Patient Details  Name: [name] MRN: [id number] Date of Birth: 06/17/1941 Referring Provider (PT): Dr. [name]   Encounter Date: 08/28/2018  PT End of Session - 08/28/18 1959    Visit Number  1    Date for PT Re-Evaluation  10/23/18    Authorization Type  [employer] Medicare     PT Start Time  1230    PT Stop Time  1315    PT Time Calculation (min)  45 min    Activity Tolerance  Patient tolerated treatment well       Past Medical History:  Diagnosis Date  . Abscessed tooth    STATES TOOTH PULLED RIGHT LOWER MOLAR   AND LEFT LOWER MOLAR ON3/25/13 AND PT ON ANTIBIOTIC  . Acute respiratory failure with hypoxia (HCC) 07/28/2013  . Bladder spasms   . Blood transfusion    HEMORRHAGED  1 WEEK AFTER COLONOSCOPY  -REQUIRED TRANSFUSION  . Chronic pancreatitis (HCC)   . Diverticulosis   . Dry eye syndrome   . Dry mouth   . 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 GERD (gastroesophageal reflux disease)   . GI bleed   . Glaucoma   . Glaucoma   . Hiatal hernia    LARGE HIATAL HERNIA PER CXR REPORT  . Hypertension   . IBS (irritable bowel syndrome)   . Internal hemorrhoids   . Osteoporosis   . Overactive bladder   . Pancreatitis    past history   . Pelvic floor dysfunction    and rectalprolaspe  . Thoracogenic scoliosis of thoracolumbar region    SEVERE  . 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 BAND HEMORRHOIDECTOMY    . BLADDER SUSPENSION  2002   Grapey - w/ hysty  . CATARACT EXTRACTION     bilateral  . COLONOSCOPY  02/03/2008   diverticulosis, internal hemorrhoids  . ESOPHAGOGASTRODUODENOSCOPY N/A 07/28/2013   Procedure: ESOPHAGOGASTRODUODENOSCOPY  (EGD);  Surgeon: Rachael Fee, MD;  Location: [facility];  Service: Endoscopy;  Laterality: N/A;  . ESOPHAGOGASTRODUODENOSCOPY N/A 07/28/2013   Procedure: ESOPHAGOGASTRODUODENOSCOPY (EGD);  Surgeon: Rachael Fee, MD;  Location: [facility];  Service: Endoscopy;  Laterality: N/A;  . EYE SURGERY     LASER OF BOTH EYES  FOR ELEVATED PRESSURE  . FLEXIBLE SIGMOIDOSCOPY  11/11/2008   internal hemorrhoids  . GASTROSTOMY TUBE PLACEMENT  07/30/13  . HIATAL HERNIA REPAIR    . HIATAL HERNIA REPAIR N/A 07/30/2013   Procedure: Exploratory Laparotomy, Lysis of Adhesions, Reduction of Hiatal Hernia, Repair of Esophogeal Perforations, Dor Fundal Plication, Evacuation of Gastric Bezoar, Gastrostomy Tube Insertion, Upper Endoscopy by Dr. [name];  Surgeon: Adolph Pollack, MD;  Location: [facility];  Service: General;  Laterality: N/A;  . MOUTH SURGERY    . MOUTH SURGERY    . NISSEN FUNDOPLICATION    . STAPLE HEMORRHOIDECTOMY    . UPPER GASTROINTESTINAL ENDOSCOPY  02/12/2008   hiatal henia  . VAGINAL HYSTERECTOMY  2002   A&P repair/bladder sling    There were no vitals filed for this visit.   Subjective Assessment - 08/28/18 1236    Subjective  Fell 1 month ago going on step at son's group home without a railing.  Injured knee.  Also fell another time that same week.  Uses SPC full time.      Pertinent History  osteoporosis, scoliosis    Limitations  Walking    How long can you walk comfortably?  50-75 feet     Diagnostic tests  none    Patient Stated Goals  Get my knee so I can go without a cane    Currently in Pain?  No/denies    Pain Score  0-No pain    Pain Location  Knee    Pain Orientation  Left    Pain Type  Acute pain    Aggravating Factors   sit to standing    Pain Relieving Factors  ice packs         [facility] PT Assessment - 08/28/18 0001      Assessment   Medical Diagnosis  frequent falls    Referring Provider (PT)  Dr. [name]    Onset Date/Surgical Date  --   1 month ago     Next MD Visit  doctor is retiring    Prior Therapy  for scoliosis in [facility]      Precautions   Precautions  Fall      Restrictions   Weight Bearing Restrictions  No      Balance Screen   Has the patient fallen in the past 6 months  Yes    How many times?  2    Has the patient had a decrease in activity level because of a fear of falling?   Yes    Is the patient reluctant to leave their home because of a fear of falling?   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   Vocation Requirements  does own grocery shopping, cooking, Education officer, environmental    Leisure  watching soap operas      Posture/Postural Control   Posture Comments  severe scoliosis thoraco-lumbar ; mild lower leg edema       AROM   Overall AROM Comments  UE ROM WFLS    Right Knee Extension  0    Right Knee Flexion  125    Left Knee Extension  5    Left Knee Flexion  115      Strength   Right Hip Flexion  4-/5    Right Hip ABduction  3+/5    Left Hip Flexion  4-/5    Left Hip ABduction  3+/5    Right Knee Flexion  4-/5    Right Knee Extension  4-/5    Left Knee Flexion  3+/5    Left Knee Extension  3+/5    Right Ankle Dorsiflexion  4-/5    Right Ankle Plantar Flexion  4-/5    Left Ankle Dorsiflexion  4-/5    Left Ankle Plantar Flexion  4-/5      Ambulation/Gait   Assistive device  Straight cane    Stairs  Yes    Stairs Assistance  --   independent with 2 railings  Standardized Balance Assessment   Five times sit to stand comments   needs significant UE use to rise from the chair     10 Meter Walk  11.6      Berg Balance Test   Sit to Stand  Able to stand  independently using hands     Standing Unsupported  Able to stand 2 minutes with supervision    Sitting with Back Unsupported but Feet Supported on Floor or Stool  Able to sit safely and securely 2 minutes    Stand to Sit  Controls descent by using hands    Transfers  Able to transfer safely, definite need of hands    Standing Unsupported with Eyes Closed  Able to stand 3 seconds    Standing Ubsupported with Feet Together  Needs help to attain position but able to stand for 30 seconds with feet together    From Standing, Reach Forward with Outstretched Arm  Can reach forward >5 cm safely (2")    From Standing Position, Pick up Object from Floor  Able to pick up shoe safely and easily    From Standing Position, Turn to Look Behind Over each Shoulder  Looks behind one side only/other side shows less weight shift    Turn 360 Degrees  Able to turn 360 degrees safely but slowly    Standing Unsupported, Alternately Place Feet on Step/Stool  Able to complete >2 steps/needs minimal assist    Standing Unsupported, One Foot in Front  Able to take small step independently and hold 30 seconds    Standing on One Leg  Tries to lift leg/unable to hold 3 seconds but remains standing independently    Total Score  34      Dynamic Gait Index   DGI comment:  to be done next visit       Timed Up and Go Test   Normal TUG (seconds)  15.05    TUG Comments  with cane                 Objective measurements completed on examination: See above findings.              PT Education - 08/28/18 1311    Education Details   Access Code: [email or web address] LAQ, march, seated heel and toe raises    Person(s) Educated  Patient    Methods  Explanation;Demonstration;Handout    Comprehension  Returned demonstration;Verbalized understanding       PT Short Term Goals - 08/28/18 2026      PT SHORT TERM GOAL #1   Title  The patient will demonstrate compliance with an initial HEP for LE strengthening    Time  4    Period  Weeks    Status   New    Target Date  09/25/18      PT SHORT TERM GOAL #2   Title  The patient will have improved left knee ROM 3-120 degrees needed for improved mobility with sit to stand and other ADLs    Time  4    Period  Weeks    Status  New      PT SHORT TERM GOAL #3   Title  The patient will demonstrate improved gait distance to 200 feet with cane needed for short distance community ambulation     Time  4    Period  Weeks    Status  New      PT SHORT TERM GOAL #4   Title  BERG balance test improved to 38/56 indicating improved balance and decreased risk of falls    Time  4    Period  Weeks    Status  New      PT SHORT TERM GOAL #5   Title  Gait speed with Timed up and Go improved to 13 sec indicating improved gait safety    Time  4    Period  Weeks    Status  New        PT Long Term Goals - 08/28/18 2035      PT LONG TERM GOAL #1   Title  The patient will be independent in safe self progression of HEP needed for further improvements in strength and balance    Time  8    Period  Weeks    Status  New    Target Date  10/23/18      PT LONG TERM GOAL #2   Title  The patient will be able to ambulate 300 feet with cane needed for community ambulation    Time  8    Period  Weeks    Status  New      PT LONG TERM GOAL #3   Title  The patient will have improved LE strength bilaterally to at least 4-/5 to 4/5 for greater ease and safety with ascending and descend stairs at her son's group home    Time  8    Period  Weeks    Status  New      PT LONG TERM GOAL #4   Title  Improved gait speed with 10 meter walk to 9 sec indicating improved gait safety     Time  8    Period  Weeks    Status  New      PT LONG TERM GOAL #5   Title  BERG balance test to 41/56 indicating improved dynamic balance and safety    Time  8    Period  Weeks    Status  New             Plan - 08/28/18 1311    Clinical Impression Statement  About a month ago, the patient had 2 falls in the same week.   One of the falls was on the stairs at her son's group home where there is no stair railing.  As the result of the fall she has new onset left knee pain aggravated with rising sit to stand.  She continues to use her SPC on a full time basis but is limited in walking distance to 50-75 feet.  Her left knee ROM decreased to 5-115 degrees.  Decreased LE strength left grossly 3+/5, right grossly 4-/5.  She lacks LE strength to rise from a standard chair without significant UE use.  BERG balance test is 34/56 indicating a high risk 100% of falls.  Her severe scoliosis/kyphosis may affect her dynamic balance as well.    Decreased gait speed with both the Timed up and Go test and 4192m walk test.  She would benefit from strengthening and balance ex's to address these deficits.      History and Personal Factors relevant to plan of care:  lack of psychosocial support, numerous comorbidities including severe thoraco-lumbar scoliosis, osteoporosis; advanced age    Clinical Presentation  Stable    Clinical Decision Making  Low    Rehab Potential  Good    PT Frequency  2x / week    PT Duration  8 weeks    PT Treatment/Interventions  ADLs/Self Care Home Management;Cryotherapy;Functional mobility training;Gait training;Stair training;Therapeutic activities;Therapeutic exercise;Patient/family education;Neuromuscular re-education;Balance training;Manual techniques;Taping    PT Next Visit Plan  Do Dynamic Gait Index;  do 6 min walk test;  seated HS stretch;  add seated clams;  Nu-step; ongoing practice on steps/curbs    PT Home Exercise Plan   Access Code: [email or web address]     Consulted and Agree with Plan of Care  Patient       Patient will benefit from skilled therapeutic intervention in order to improve the following deficits and impairments:  Pain, Difficulty walking, Impaired perceived functional ability, Decreased range of motion, Decreased strength, Decreased activity tolerance, Decreased balance, Postural  dysfunction  Visit Diagnosis: Muscle weakness (generalized) - Plan: PT plan of care cert/re-cert  Difficulty in walking, not elsewhere classified - Plan: PT plan of care cert/re-cert  Unspecified lack of coordination - Plan: PT plan of care cert/re-cert  Acute pain of left knee - Plan: PT plan of care cert/re-cert     Problem List Patient Active Problem List   Diagnosis Date Noted  . HH (hiatus hernia): RECURRENT s/p repair 08/04/2013  . Protein calorie malnutrition (HCC) 07/29/2013  . Hemorrhoids grade 3 prolapsing 10/02/2011  . Chronic pancreatitis (HCC) 02/17/2009  . Irritable bowel syndrome 02/17/2009  . OSTEOPOROSIS 06/08/2008  . GLAUCOMA 02/25/2008  . GERD 02/25/2008  . THORACOLUMBAR SCOLIOSIS, MILD 02/25/2008   Lavinia Sharps, PT 08/28/18 8:42 PM Phone: [phone] Fax: [phone]  Vivien Presto 08/28/2018, 8:41 PM  [facility] [address] [place], [place], [address] Phone: [phone]   Fax:  [phone]  Name: [name] MRN: [id number] Date of Birth: [birth year]

## 2018-09-01 ENCOUNTER — Other Ambulatory Visit: Payer: Self-pay | Admitting: Internal Medicine

## 2018-09-12 ENCOUNTER — Encounter: Payer: Self-pay | Admitting: Physical Therapy

## 2018-09-12 ENCOUNTER — Ambulatory Visit: Payer: Medicare Other | Attending: Family Medicine | Admitting: Physical Therapy

## 2018-09-12 DIAGNOSIS — R279 Unspecified lack of coordination: Secondary | ICD-10-CM | POA: Insufficient documentation

## 2018-09-12 DIAGNOSIS — M25562 Pain in left knee: Secondary | ICD-10-CM | POA: Insufficient documentation

## 2018-09-12 DIAGNOSIS — R262 Difficulty in walking, not elsewhere classified: Secondary | ICD-10-CM | POA: Diagnosis present

## 2018-09-12 DIAGNOSIS — M6281 Muscle weakness (generalized): Secondary | ICD-10-CM | POA: Insufficient documentation

## 2018-09-12 NOTE — Patient Instructions (Signed)
Access Code: [email or web address]  URL: [email or web address]  Date: 09/12/2018  Prepared by: Donita BrooksSara Dedric Ethington   Exercises  Seated Long Arc Quad - 10 reps - 2 sets - 1x daily - 7x weekly  Seated March - 10 reps - 2 sets - 1x daily - 7x weekly  Seated Heel Raise - 10 reps - 2 sets - 1x daily - 7x weekly  Seated Toe Raise - 10 reps - 2 sets - 1x daily - 7x weekly  Seated Hip Abduction - 10 reps - 3 sets - 1x daily - 7x weekly  Seated Hamstring Stretch - 3 sets - 20 hold - 1x daily - 7x weekly     [facility] [address], [address] [place], [place] [address] Phone # [phone] Fax [phone]

## 2018-09-12 NOTE — Therapy (Signed)
[facility] [address] [place], [place], [address] Phone: [phone]   Fax:  [phone]  Physical Therapy Treatment  Patient Details  Name: [name] MRN: [id number] Date of Birth: 03-17-1941 Referring Provider (PT): Dr. [name]   Encounter Date: 09/12/2018  PT End of Session - 09/12/18 1112    Visit Number  2    Date for PT Re-Evaluation  10/23/18    Authorization Type  [employer]HC Medicare     PT Start Time  0936    PT Stop Time  1015    PT Time Calculation (min)  39 min    Activity Tolerance  Patient tolerated treatment well;No increased pain    Behavior During Therapy  WFL for tasks assessed/performed       Past Medical History:  Diagnosis Date  . Abscessed tooth    STATES TOOTH PULLED RIGHT LOWER MOLAR   AND LEFT LOWER MOLAR ON3/25/13 AND PT ON ANTIBIOTIC  . Acute respiratory failure with hypoxia (HCC) 07/28/2013  . Bladder spasms   . Blood transfusion    HEMORRHAGED  1 WEEK AFTER COLONOSCOPY  -REQUIRED TRANSFUSION  . Chronic pancreatitis (HCC)   . Diverticulosis   . Dry eye syndrome   . Dry mouth   . 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 GERD (gastroesophageal reflux disease)   . GI bleed   . Glaucoma   . Glaucoma   . Hiatal hernia    LARGE HIATAL HERNIA PER CXR REPORT  . Hypertension   . IBS (irritable bowel syndrome)   . Internal hemorrhoids   . Osteoporosis   . Overactive bladder   . Pancreatitis    past history   . Pelvic floor dysfunction    and rectalprolaspe  . Thoracogenic scoliosis of thoracolumbar region    SEVERE  . 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 BAND HEMORRHOIDECTOMY    . BLADDER SUSPENSION  2002   Grapey - w/ hysty  . CATARACT EXTRACTION     bilateral  . COLONOSCOPY  02/03/2008   diverticulosis, internal hemorrhoids  .  ESOPHAGOGASTRODUODENOSCOPY N/A 07/28/2013   Procedure: ESOPHAGOGASTRODUODENOSCOPY (EGD);  Surgeon: Rachael Fee, MD;  Location: [facility];  Service: Endoscopy;  Laterality: N/A;  . ESOPHAGOGASTRODUODENOSCOPY N/A 07/28/2013   Procedure: ESOPHAGOGASTRODUODENOSCOPY (EGD);  Surgeon: Rachael Fee, MD;  Location: [facility];  Service: Endoscopy;  Laterality: N/A;  . EYE SURGERY     LASER OF BOTH EYES  FOR ELEVATED PRESSURE  . FLEXIBLE SIGMOIDOSCOPY  11/11/2008   internal hemorrhoids  . GASTROSTOMY TUBE PLACEMENT  07/30/13  . HIATAL HERNIA REPAIR    . HIATAL HERNIA REPAIR N/A 07/30/2013   Procedure: Exploratory Laparotomy, Lysis of Adhesions, Reduction of Hiatal Hernia, Repair of Esophogeal Perforations, Dor Fundal Plication, Evacuation of Gastric Bezoar, Gastrostomy Tube Insertion, Upper Endoscopy by Dr. [name];  Surgeon: Adolph Pollack, MD;  Location: [facility];  Service: General;  Laterality: N/A;  . MOUTH SURGERY    . MOUTH SURGERY    . NISSEN FUNDOPLICATION    . STAPLE HEMORRHOIDECTOMY    . UPPER GASTROINTESTINAL ENDOSCOPY  02/12/2008   hiatal henia  . VAGINAL HYSTERECTOMY  2002   A&P repair/bladder sling    There were no vitals filed for this visit.  Subjective Assessment - 09/12/18 0937    Subjective  Pt states  that things are going well. She has no pain currently.     Pertinent History  osteoporosis, scoliosis    Limitations  Walking    How long can you walk comfortably?  50-75 feet     Diagnostic tests  none    Patient Stated Goals  Get my knee so I can go without a cane    Currently in Pain?  No/denies         [facility] PT Assessment - 09/12/18 0001      6 minute walk test results    Endurance additional comments  6 MWT: 1056 ft, pt ended at 56 sec remaining       Balance   Balance Assessed  Yes                   [facility] Adult PT Treatment/Exercise - 09/12/18 0001      Exercises   Exercises  Knee/Hip      Knee/Hip Exercises: Stretches   Passive  Hamstring Stretch  Right;Left;2 reps;20 seconds    Passive Hamstring Stretch Limitations  seated with cues to decrease trunk flexion       Knee/Hip Exercises: Standing   Heel Raises  Both;1 set;15 reps    Heel Raises Limitations  toe raises 1x15 reps BLE     Other Standing Knee Exercises  tandem with intermittent UE support and MinA 2x20 sec each LE; NBOS on foam pad 2x20 sec; NBOS on foam pad with external perturbations x20 reps       Knee/Hip Exercises: Seated   Clamshell with TheraBand  Red   2x10 reps single leg             PT Education - 09/12/18 1109    Education Details  technique with therex; results of walking test; addition to HEP    Person(s) Educated  Patient    Methods  Explanation;Handout    Comprehension  Verbalized understanding;Returned demonstration       PT Short Term Goals - 08/28/18 2026      PT SHORT TERM GOAL #1   Title  The patient will demonstrate compliance with an initial HEP for LE strengthening    Time  4    Period  Weeks    Status  New    Target Date  09/25/18      PT SHORT TERM GOAL #2   Title  The patient will have improved left knee ROM 3-120 degrees needed for improved mobility with sit to stand and other ADLs    Time  4    Period  Weeks    Status  New      PT SHORT TERM GOAL #3   Title  The patient will demonstrate improved gait distance to 200 feet with cane needed for short distance community ambulation     Time  4    Period  Weeks    Status  New      PT SHORT TERM GOAL #4   Title  BERG balance test improved to 38/56 indicating improved balance and decreased risk of falls    Time  4    Period  Weeks    Status  New      PT SHORT TERM GOAL #5   Title  Gait speed with Timed up and Go improved to 13 sec indicating improved gait safety    Time  4    Period  Weeks    Status  New  PT Long Term Goals - 09/12/18 1148      PT LONG TERM GOAL #1   Title  The patient will be independent in safe self progression of HEP  needed for further improvements in strength and balance    Time  8    Period  Weeks    Status  New      PT LONG TERM GOAL #2   Title  The patient will be able to ambulate 300 feet with cane needed for community ambulation    Time  8    Period  Weeks    Status  New      PT LONG TERM GOAL #3   Title  The patient will have improved LE strength bilaterally to at least 4-/5 to 4/5 for greater ease and safety with ascending and descend stairs at her son's group home    Time  8    Period  Weeks    Status  New      PT LONG TERM GOAL #4   Title  Improved gait speed with 10 meter walk to 9 sec indicating improved gait safety     Time  8    Period  Weeks    Status  New      PT LONG TERM GOAL #5   Title  BERG balance test to 41/56 indicating improved dynamic balance and safety    Time  8    Period  Weeks    Status  New      Additional Long Term Goals   Additional Long Term Goals  Yes      PT LONG TERM GOAL #6   Title  Pt will be able to ambulate greater than 1300 ft on 6MWT to reflect improvements in endurance and stability with her daily walks.    Time  8    Period  Weeks    Status  New            Plan - 09/12/18 1113    Clinical Impression Statement  Pt has been consistently completing her HEP regularly since her evaluation. Completed 6 MWT this visit and was able to ambulate 1,056 ft with her SPC. Remainder of the session focused on therex to promote hip flexibility, LE strength and proprioception. Pt required encouragement to complete balance activity secondary to feeling unsteady and wanting to hold onto the surface. She had no LOB and required MinA during a majority of the static balance activity completed this session.     Rehab Potential  Good    PT Frequency  2x / week    PT Duration  8 weeks    PT Treatment/Interventions  ADLs/Self Care Home Management;Cryotherapy;Functional mobility training;Gait training;Stair training;Therapeutic activities;Therapeutic  exercise;Patient/family education;Neuromuscular re-education;Balance training;Manual techniques;Taping    PT Next Visit Plan  progress LE strength and flexibility; balance activity Nu-step; ongoing practice on steps/curbs    PT Home Exercise Plan   Access Code: [email or web address]     Consulted and Agree with Plan of Care  Patient       Patient will benefit from skilled therapeutic intervention in order to improve the following deficits and impairments:  Pain, Difficulty walking, Impaired perceived functional ability, Decreased range of motion, Decreased strength, Decreased activity tolerance, Decreased balance, Postural dysfunction  Visit Diagnosis: Muscle weakness (generalized)  Difficulty in walking, not elsewhere classified  Unspecified lack of coordination  Acute pain of left knee     Problem List Patient Active Problem List   Diagnosis Date  Noted  . HH (hiatus hernia): RECURRENT s/p repair 08/04/2013  . Protein calorie malnutrition (HCC) 07/29/2013  . Hemorrhoids grade 3 prolapsing 10/02/2011  . Chronic pancreatitis (HCC) 02/17/2009  . Irritable bowel syndrome 02/17/2009  . OSTEOPOROSIS 06/08/2008  . GLAUCOMA 02/25/2008  . GERD 02/25/2008  . THORACOLUMBAR SCOLIOSIS, MILD 02/25/2008   11:49 AM,09/12/18 Donita BrooksSara Treyvon Blahut PT, DPT [facility]  [phone]  [facility] [address] [place], [place], [address] Phone: [phone]   Fax:  [phone]  Name: Verdis FredericksonJean F Mackert MRN: [id number] Date of Birth: 09/15/1940

## 2018-09-16 ENCOUNTER — Encounter: Payer: Self-pay | Admitting: Physical Therapy

## 2018-09-16 ENCOUNTER — Ambulatory Visit: Payer: Medicare Other | Admitting: Physical Therapy

## 2018-09-16 DIAGNOSIS — R279 Unspecified lack of coordination: Secondary | ICD-10-CM

## 2018-09-16 DIAGNOSIS — M6281 Muscle weakness (generalized): Secondary | ICD-10-CM | POA: Diagnosis not present

## 2018-09-16 DIAGNOSIS — R262 Difficulty in walking, not elsewhere classified: Secondary | ICD-10-CM

## 2018-09-16 DIAGNOSIS — M25562 Pain in left knee: Secondary | ICD-10-CM

## 2018-09-16 NOTE — Therapy (Signed)
[facility] [address], [address] [place], [place], [address] Phone: [phone]   Fax:  [phone]  Physical Therapy Treatment  Patient Details  Name: Angela FredericksonJean F [name] MRN: [id number] Date of Birth: 01/11/1941 Referring Provider (PT): Dr. Shaune Pollackonna Gates   Encounter Date: 09/16/2018  PT End of Session - 09/16/18 1732    Visit Number  3    Date for PT Re-Evaluation  10/23/18    Authorization Type  [employer] Medicare     PT Start Time  1544   pt late and then needed to use bathroom   PT Stop Time  1615    PT Time Calculation (min)  31 min    Activity Tolerance  Patient tolerated treatment well       Past Medical History:  Diagnosis Date  . Abscessed tooth    STATES TOOTH PULLED RIGHT LOWER MOLAR   AND LEFT LOWER MOLAR ON3/25/13 AND PT ON ANTIBIOTIC  . Acute respiratory failure with hypoxia (HCC) 07/28/2013  . Bladder spasms   . Blood transfusion    HEMORRHAGED  1 WEEK AFTER COLONOSCOPY  -REQUIRED TRANSFUSION  . Chronic pancreatitis (HCC)   . Diverticulosis   . Dry eye syndrome   . Dry mouth   . 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 GERD (gastroesophageal reflux disease)   . GI bleed   . Glaucoma   . Glaucoma   . Hiatal hernia    LARGE HIATAL HERNIA PER CXR REPORT  . Hypertension   . IBS (irritable bowel syndrome)   . Internal hemorrhoids   . Osteoporosis   . Overactive bladder   . Pancreatitis    past history   . Pelvic floor dysfunction    and rectalprolaspe  . Thoracogenic scoliosis of thoracolumbar region    SEVERE  . 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 BAND HEMORRHOIDECTOMY    . BLADDER SUSPENSION  2002   Grapey - w/ hysty  . CATARACT EXTRACTION     bilateral  . COLONOSCOPY  02/03/2008   diverticulosis, internal hemorrhoids  . ESOPHAGOGASTRODUODENOSCOPY N/A 07/28/2013   Procedure: ESOPHAGOGASTRODUODENOSCOPY (EGD);  Surgeon: Rachael Feeaniel P Jacobs, MD;  [facility];  Service: Endoscopy;  Laterality: N/A;  . ESOPHAGOGASTRODUODENOSCOPY N/A 07/28/2013   Procedure: ESOPHAGOGASTRODUODENOSCOPY (EGD);  Surgeon: Rachael Feeaniel P Jacobs, MD;  [facility];  Service: Endoscopy;  Laterality: N/A;  . EYE SURGERY     LASER OF BOTH EYES  FOR ELEVATED PRESSURE  . FLEXIBLE SIGMOIDOSCOPY  11/11/2008   internal hemorrhoids  . GASTROSTOMY TUBE PLACEMENT  07/30/13  . HIATAL HERNIA REPAIR    . HIATAL HERNIA REPAIR N/A 07/30/2013   Procedure: Exploratory Laparotomy, Lysis of Adhesions, Reduction of Hiatal Hernia, Repair of Esophogeal Perforations, Dor Fundal Plication, Evacuation of Gastric Bezoar, Gastrostomy Tube Insertion, Upper Endoscopy by Dr. Corliss Skainssuei;  Surgeon: Adolph Pollackodd J Rosenbower, MD;  Location: [facility];  Service: General;  Laterality: N/A;  . MOUTH SURGERY    . MOUTH SURGERY    . NISSEN FUNDOPLICATION    . STAPLE HEMORRHOIDECTOMY    . UPPER GASTROINTESTINAL ENDOSCOPY  02/12/2008   hiatal henia  . VAGINAL HYSTERECTOMY  2002   A&P repair/bladder sling    There were no vitals filed for this visit.  Subjective Assessment - 09/16/18 1542    Subjective  The patient states she was able to  get in/out of the car and up/down the steps easier recently.  "I'm doing better."  "I ain't got time for no pain!"     Pertinent History  osteoporosis, scoliosis    Currently in Pain?  No/denies    Pain Score  0-No pain                       [facility] Adult PT Treatment/Exercise - 09/16/18 0001      Knee/Hip Exercises: Aerobic   Nustep  seat 7 5 min L1 while discussing progress and HEP     Knee/Hip Exercises: Standing   Heel Raises  Both;1 set;10 reps    Hip Abduction  AROM;Right;Left;5 reps    Forward Step Up  Right;Left;5 reps;Hand Hold: 2;Step Height: 6"    Other Standing Knee Exercises  tandem with intermittent UE support and MinA 2x20 sec each LE; NBOS on foam pad  2x20 sec; NBOS on foam pad with external perturbations x20 reps     Other Standing Knee Exercises  step taps 10x       Knee/Hip Exercises: Seated   Long Arc Quad  Strengthening;Right;Left;10 reps    Long Arc Quad Weight  2 lbs.    Knee/Hip Flexion  2# 10x    Sit to Sand  10 reps;without UE support               PT Short Term Goals - 08/28/18 2026      PT SHORT TERM GOAL #1   Title  The patient will demonstrate compliance with an initial HEP for LE strengthening    Time  4    Period  Weeks    Status  New    Target Date  09/25/18      PT SHORT TERM GOAL #2   Title  The patient will have improved left knee ROM 3-120 degrees needed for improved mobility with sit to stand and other ADLs    Time  4    Period  Weeks    Status  New      PT SHORT TERM GOAL #3   Title  The patient will demonstrate improved gait distance to 200 feet with cane needed for short distance community ambulation     Time  4    Period  Weeks    Status  New      PT SHORT TERM GOAL #4   Title  BERG balance test improved to 38/56 indicating improved balance and decreased risk of falls    Time  4    Period  Weeks    Status  New      PT SHORT TERM GOAL #5   Title  Gait speed with Timed up and Go improved to 13 sec indicating improved gait safety    Time  4    Period  Weeks    Status  New        PT Long Term Goals - 09/12/18 1148      PT LONG TERM GOAL #1   Title  The patient will be independent in safe self progression of HEP needed for further improvements in strength and balance    Time  8    Period  Weeks    Status  New      PT LONG TERM GOAL #2   Title  The patient will be able to ambulate 300 feet with cane needed for community ambulation    Time  8  Period  Weeks    Status  New      PT LONG TERM GOAL #3   Title  The patient will have improved LE strength bilaterally to at least 4-/5 to 4/5 for greater ease and safety with ascending and descend stairs at her son's group home     Time  8    Period  Weeks    Status  New      PT LONG TERM GOAL #4   Title  Improved gait speed with 10 meter walk to 9 sec indicating improved gait safety     Time  8    Period  Weeks    Status  New      PT LONG TERM GOAL #5   Title  BERG balance test to 41/56 indicating improved dynamic balance and safety    Time  8    Period  Weeks    Status  New      Additional Long Term Goals   Additional Long Term Goals  Yes      PT LONG TERM GOAL #6   Title  Pt will be able to ambulate greater than 1300 ft on to reflect improvements in endurance and stability with her daily walks.    Time  8    Period  Weeks    Status  New            Plan - 09/16/18 1733    Clinical Impression Statement  The patient is able to participate in a progression of exercises in sitting and standing without complaints of knee pain.   Verbal cues to hold her head up while standing/walking and close supervision and contact guard assist for safety with standing balance neuromuscular re-education exercise.  She is motivated to stay active which should help with HEP compliance.      Rehab Potential  Good    PT Frequency  2x / week    PT Duration  8 weeks    PT Treatment/Interventions  ADLs/Self Care Home Management;Cryotherapy;Functional mobility training;Gait training;Stair training;Therapeutic activities;Therapeutic exercise;Patient/family education;Neuromuscular re-education;Balance training;Manual techniques;Taping    PT Next Visit Plan  try seated band for LE resistive exs vs. weights for home use;  add sit to stand to HEP;  progress LE strength and flexibility; balance activity Nu-step; ongoing practice on steps/curbs    PT Home Exercise Plan   Access Code: [email or web address]        Patient will benefit from skilled therapeutic intervention in order to improve the following deficits and impairments:  Pain, Difficulty walking, Impaired perceived functional ability, Decreased range of motion, Decreased strength,  Decreased activity tolerance, Decreased balance, Postural dysfunction  Visit Diagnosis: Muscle weakness (generalized)  Difficulty in walking, not elsewhere classified  Unspecified lack of coordination  Acute pain of left knee     Problem List Patient Active Problem List   Diagnosis Date Noted  . HH (hiatus hernia): RECURRENT s/p repair 08/04/2013  . Protein calorie malnutrition (HCC) 07/29/2013  . Hemorrhoids grade 3 prolapsing 10/02/2011  . Chronic pancreatitis (HCC) 02/17/2009  . Irritable bowel syndrome 02/17/2009  . OSTEOPOROSIS 06/08/2008  . GLAUCOMA 02/25/2008  . GERD 02/25/2008  . THORACOLUMBAR SCOLIOSIS, MILD 02/25/2008   Lavinia Sharps, PT 09/16/18 5:42 PM Phone: [phone] Fax: [phone] Vivien Presto 09/16/2018, 5:42 PM  [facility] [address] [place], [place], [address] Phone: [phone]   Fax:  [phone]  Name: [name] MRN: [id number] Date of Birth:  10/30/1940   

## 2018-09-18 ENCOUNTER — Ambulatory Visit: Payer: Medicare Other | Admitting: Physical Therapy

## 2018-09-18 ENCOUNTER — Encounter: Payer: Self-pay | Admitting: Physical Therapy

## 2018-09-18 DIAGNOSIS — R279 Unspecified lack of coordination: Secondary | ICD-10-CM

## 2018-09-18 DIAGNOSIS — M6281 Muscle weakness (generalized): Secondary | ICD-10-CM

## 2018-09-18 DIAGNOSIS — M25562 Pain in left knee: Secondary | ICD-10-CM

## 2018-09-18 DIAGNOSIS — R262 Difficulty in walking, not elsewhere classified: Secondary | ICD-10-CM

## 2018-09-18 NOTE — Patient Instructions (Signed)
Access Code: [email or web address]  URL: [email or web address]  Date: 09/18/2018  Prepared by: Lavinia Sharps   Exercises  Seated Long Arc Quad - 10 reps - 2 sets - 1x daily - 7x weekly  Seated March - 10 reps - 2 sets - 1x daily - 7x weekly  Seated Heel Raise - 10 reps - 2 sets - 1x daily - 7x weekly  Seated Toe Raise - 10 reps - 2 sets - 1x daily - 7x weekly  Seated Hip Abduction - 10 reps - 3 sets - 1x daily - 7x weekly  Seated Hamstring Stretch - 3 sets - 20 hold - 1x daily - 7x weekly  Sit to Stand - 10 reps - 3 sets - 1x daily - 7x weekly  Standing Hip Abduction with Resistance at Thighs - 10 reps - 1 sets - 1x daily - 7x weekly  Standing Hip Extension Kicks - 10 reps - 1 sets - 1x daily - 7x weekly

## 2018-09-18 NOTE — Therapy (Signed)
[facility] [address], [address] [place], [place], [address] Phone: [phone]   Fax:  [phone]  Physical Therapy Treatment  Patient Details  Name: [name] MRN: [id number] Date of Birth: 09/04/1941 Referring Provider (PT): Dr. Shaune Pollackonna Gates   Encounter Date: 09/18/2018  PT End of Session - 09/18/18 1520    Visit Number  4    Date for PT Re-Evaluation  10/23/18    Authorization Type  [employer] Medicare     PT Start Time  1450    PT Stop Time  1528    PT Time Calculation (min)  38 min    Activity Tolerance  Patient tolerated treatment well       Past Medical History:  Diagnosis Date  . Abscessed tooth    STATES TOOTH PULLED RIGHT LOWER MOLAR   AND LEFT LOWER MOLAR ON3/25/13 AND PT ON ANTIBIOTIC  . Acute respiratory failure with hypoxia (HCC) 07/28/2013  . Bladder spasms   . Blood transfusion    HEMORRHAGED  1 WEEK AFTER COLONOSCOPY  -REQUIRED TRANSFUSION  . Chronic pancreatitis (HCC)   . Diverticulosis   . Dry eye syndrome   . Dry mouth   . 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 GERD (gastroesophageal reflux disease)   . GI bleed   . Glaucoma   . Glaucoma   . Hiatal hernia    LARGE HIATAL HERNIA PER CXR REPORT  . Hypertension   . IBS (irritable bowel syndrome)   . Internal hemorrhoids   . Osteoporosis   . Overactive bladder   . Pancreatitis    past history   . Pelvic floor dysfunction    and rectalprolaspe  . Thoracogenic scoliosis of thoracolumbar region    SEVERE  . 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 BAND HEMORRHOIDECTOMY    . BLADDER SUSPENSION  2002   Grapey - w/ hysty  . CATARACT EXTRACTION     bilateral  . COLONOSCOPY  02/03/2008   diverticulosis, internal hemorrhoids  . ESOPHAGOGASTRODUODENOSCOPY N/A 07/28/2013   Procedure: ESOPHAGOGASTRODUODENOSCOPY  (EGD);  Surgeon: Rachael Feeaniel P Jacobs, MD;  [facility];  Service: Endoscopy;  Laterality: N/A;  . ESOPHAGOGASTRODUODENOSCOPY N/A 07/28/2013   Procedure: ESOPHAGOGASTRODUODENOSCOPY (EGD);  Surgeon: Rachael Feeaniel P Jacobs, MD;  [facility];  Service: Endoscopy;  Laterality: N/A;  . EYE SURGERY     LASER OF BOTH EYES  FOR ELEVATED PRESSURE  . FLEXIBLE SIGMOIDOSCOPY  11/11/2008   internal hemorrhoids  . GASTROSTOMY TUBE PLACEMENT  07/30/13  . HIATAL HERNIA REPAIR    . HIATAL HERNIA REPAIR N/A 07/30/2013   Procedure: Exploratory Laparotomy, Lysis of Adhesions, Reduction of Hiatal Hernia, Repair of Esophogeal Perforations, Dor Fundal Plication, Evacuation of Gastric Bezoar, Gastrostomy Tube Insertion, Upper Endoscopy by Dr. Corliss Skainssuei;  Surgeon: Adolph Pollackodd J Rosenbower, MD;  Location: [facility];  Service: General;  Laterality: N/A;  . MOUTH SURGERY    . MOUTH SURGERY    . NISSEN FUNDOPLICATION    . STAPLE HEMORRHOIDECTOMY    . UPPER GASTROINTESTINAL ENDOSCOPY  02/12/2008   hiatal henia  . VAGINAL HYSTERECTOMY  2002   A&P repair/bladder sling    There were no vitals filed for this visit.  Subjective Assessment - 09/18/18 1450    Subjective  The patient denies pain or soreness.  I'm real pleased I can go in/out of  my back door now.      Pertinent History  osteoporosis, scoliosis    Currently in Pain?  No/denies    Pain Score  0-No pain                       [facility] Adult PT Treatment/Exercise - 09/18/18 0001      Knee/Hip Exercises: Aerobic   Nustep  seat 7 8 min L1   while discussing progress     Knee/Hip Exercises: Standing   Heel Raises  Both;1 set;10 reps    Hip Abduction  Stengthening;Right;Left;10 reps    Abduction Limitations  red band     Hip Extension  Stengthening;Right;Left;10 reps    Extension Limitations  red band     Forward Step Up  Right;Left;1 set;5 reps;Hand Hold: 2;Step Height: 6"    Other Standing Knee Exercises  step taps 10x       Knee/Hip Exercises:  Seated   Long Arc Quad  Strengthening;Right;Left;10 reps    Long Arc Quad Limitations  red band looped around feet    Clamshell with TheraBand  Red    Knee/Hip Flexion  red band looped around feet 10x each leg    Sit to Sand  10 reps;without UE support   with ball squeeze            PT Education - 09/18/18 1515    Education Details   Access Code: [email or web address]   red band hip abduction , extension; sit to stand    Person(s) Educated  Patient    Methods  Explanation;Demonstration;Handout    Comprehension  Returned demonstration;Verbalized understanding       PT Short Term Goals - 08/28/18 2026      PT SHORT TERM GOAL #1   Title  The patient will demonstrate compliance with an initial HEP for LE strengthening    Time  4    Period  Weeks    Status  New    Target Date  09/25/18      PT SHORT TERM GOAL #2   Title  The patient will have improved left knee ROM 3-120 degrees needed for improved mobility with sit to stand and other ADLs    Time  4    Period  Weeks    Status  New      PT SHORT TERM GOAL #3   Title  The patient will demonstrate improved gait distance to 200 feet with cane needed for short distance community ambulation     Time  4    Period  Weeks    Status  New      PT SHORT TERM GOAL #4   Title  BERG balance test improved to 38/56 indicating improved balance and decreased risk of falls    Time  4    Period  Weeks    Status  New      PT SHORT TERM GOAL #5   Title  Gait speed with Timed up and Go improved to 13 sec indicating improved gait safety    Time  4    Period  Weeks    Status  New        PT Long Term Goals - 09/12/18 1148      PT LONG TERM GOAL #1   Title  The patient will be independent in safe self progression of HEP needed for further improvements in strength and balance    Time  8    Period  Weeks    Status  New      PT LONG TERM GOAL #2   Title  The patient will be able to ambulate 300 feet with cane needed for community ambulation     Time  8    Period  Weeks    Status  New      PT LONG TERM GOAL #3   Title  The patient will have improved LE strength bilaterally to at least 4-/5 to 4/5 for greater ease and safety with ascending and descend stairs at her son's group home    Time  8    Period  Weeks    Status  New      PT LONG TERM GOAL #4   Title  Improved gait speed with 10 meter walk to 9 sec indicating improved gait safety     Time  8    Period  Weeks    Status  New      PT LONG TERM GOAL #5   Title  BERG balance test to 41/56 indicating improved dynamic balance and safety    Time  8    Period  Weeks    Status  New      Additional Long Term Goals   Additional Long Term Goals  Yes      PT LONG TERM GOAL #6   Title  Pt will be able to ambulate greater than 1300 ft on to reflect improvements in endurance and stability with her daily walks.    Time  8    Period  Weeks    Status  New            Plan - 09/18/18 1459    Clinical Impression Statement  The patient continues to progress with LE strengthening and is able to add resistance to both sitting and standing ex's.  She denies knee pain.  Verbal cues to hold her head up.  She does report muscular fatigue in her calf and thigh muscles post treatment session.  She would like to try the resistive hip ex's with band at home while holding to her kitchen counter.  Therapist closely monitoring response and providing close supervision for safety secondary risk of falls.      Rehab Potential  Good    PT Frequency  2x / week    PT Treatment/Interventions  ADLs/Self Care Home Management;Cryotherapy;Functional mobility training;Gait training;Stair training;Therapeutic activities;Therapeutic exercise;Patient/family education;Neuromuscular re-education;Balance training;Manual techniques;Taping    PT Next Visit Plan   recheck progress toward STGs next visit;  seated band for LE resistive exs; progress LE strength and flexibility; balance activity Nu-step; ongoing  practice on steps/curbs    PT Home Exercise Plan   Access Code: [email or web address]        Patient will benefit from skilled therapeutic intervention in order to improve the following deficits and impairments:  Pain, Difficulty walking, Impaired perceived functional ability, Decreased range of motion, Decreased strength, Decreased activity tolerance, Decreased balance, Postural dysfunction  Visit Diagnosis: Muscle weakness (generalized)  Difficulty in walking, not elsewhere classified  Unspecified lack of coordination  Acute pain of left knee     Problem List Patient Active Problem List   Diagnosis Date Noted  . HH (hiatus hernia): RECURRENT s/p repair 08/04/2013  . Protein calorie malnutrition (HCC) 07/29/2013  . Hemorrhoids grade 3 prolapsing 10/02/2011  . Chronic pancreatitis (HCC) 02/17/2009  . Irritable bowel syndrome 02/17/2009  . OSTEOPOROSIS 06/08/2008  . GLAUCOMA 02/25/2008  . GERD 02/25/2008  .  THORACOLUMBAR SCOLIOSIS, MILD 02/25/2008   Lavinia SharpsStacy Meshell Abdulaziz, PT 09/18/18 5:16 PM Phone: [phone] Fax: [phone] Vivien PrestoSimpson, Cacie Gaskins C 09/18/2018, 5:15 PM  [facility] [address], [address] [place], [place], [address] Phone: [phone]   Fax:  [phone]  Name: [name] MRN: [id number] Date of Birth: 08/11/1941

## 2018-09-23 ENCOUNTER — Ambulatory Visit: Payer: Medicare Other | Admitting: Physical Therapy

## 2018-09-23 ENCOUNTER — Encounter: Payer: Self-pay | Admitting: Physical Therapy

## 2018-09-23 DIAGNOSIS — M25562 Pain in left knee: Secondary | ICD-10-CM

## 2018-09-23 DIAGNOSIS — R279 Unspecified lack of coordination: Secondary | ICD-10-CM

## 2018-09-23 DIAGNOSIS — R262 Difficulty in walking, not elsewhere classified: Secondary | ICD-10-CM

## 2018-09-23 DIAGNOSIS — M6281 Muscle weakness (generalized): Secondary | ICD-10-CM | POA: Diagnosis not present

## 2018-09-23 NOTE — Therapy (Signed)
[facility] [address], [address] [place], [place], [address] Phone: [phone]   Fax:  [phone]  Physical Therapy Treatment  Patient Details  Name: [name] MRN: [id number] Date of Birth: 12/10/1940 Referring Provider (PT): Dr. Shaune Pollackonna Gates   Encounter Date: 09/23/2018  PT End of Session - 09/23/18 1511    Visit Number  5    Date for PT Re-Evaluation  10/23/18    Authorization Type  [employer] Medicare     PT Start Time  1454   pt arrived late    PT Stop Time  1527    PT Time Calculation (min)  33 min    Activity Tolerance  Patient tolerated treatment well;No increased pain    Behavior During Therapy  WFL for tasks assessed/performed       Past Medical History:  Diagnosis Date  . Abscessed tooth    STATES TOOTH PULLED RIGHT LOWER MOLAR   AND LEFT LOWER MOLAR ON3/25/13 AND PT ON ANTIBIOTIC  . Acute respiratory failure with hypoxia (HCC) 07/28/2013  . Bladder spasms   . Blood transfusion    HEMORRHAGED  1 WEEK AFTER COLONOSCOPY  -REQUIRED TRANSFUSION  . Chronic pancreatitis (HCC)   . Diverticulosis   . Dry eye syndrome   . Dry mouth   . 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 GERD (gastroesophageal reflux disease)   . GI bleed   . Glaucoma   . Glaucoma   . Hiatal hernia    LARGE HIATAL HERNIA PER CXR REPORT  . Hypertension   . IBS (irritable bowel syndrome)   . Internal hemorrhoids   . Osteoporosis   . Overactive bladder   . Pancreatitis    past history   . Pelvic floor dysfunction    and rectalprolaspe  . Thoracogenic scoliosis of thoracolumbar region    SEVERE  . 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 BAND HEMORRHOIDECTOMY    . BLADDER SUSPENSION  2002   Grapey - w/ hysty  . CATARACT EXTRACTION     bilateral  . COLONOSCOPY  02/03/2008   diverticulosis, internal  hemorrhoids  . ESOPHAGOGASTRODUODENOSCOPY N/A 07/28/2013   Procedure: ESOPHAGOGASTRODUODENOSCOPY (EGD);  Surgeon: Rachael Feeaniel P Jacobs, MD;  [facility];  Service: Endoscopy;  Laterality: N/A;  . ESOPHAGOGASTRODUODENOSCOPY N/A 07/28/2013   Procedure: ESOPHAGOGASTRODUODENOSCOPY (EGD);  Surgeon: Rachael Feeaniel P Jacobs, MD;  [facility];  Service: Endoscopy;  Laterality: N/A;  . EYE SURGERY     LASER OF BOTH EYES  FOR ELEVATED PRESSURE  . FLEXIBLE SIGMOIDOSCOPY  11/11/2008   internal hemorrhoids  . GASTROSTOMY TUBE PLACEMENT  07/30/13  . HIATAL HERNIA REPAIR    . HIATAL HERNIA REPAIR N/A 07/30/2013   Procedure: Exploratory Laparotomy, Lysis of Adhesions, Reduction of Hiatal Hernia, Repair of Esophogeal Perforations, Dor Fundal Plication, Evacuation of Gastric Bezoar, Gastrostomy Tube Insertion, Upper Endoscopy by Dr. Corliss Skainssuei;  Surgeon: Adolph Pollackodd J Rosenbower, MD;  Location: [facility];  Service: General;  Laterality: N/A;  . MOUTH SURGERY    . MOUTH SURGERY    . NISSEN FUNDOPLICATION    . STAPLE HEMORRHOIDECTOMY    . UPPER GASTROINTESTINAL ENDOSCOPY  02/12/2008   hiatal henia  . VAGINAL HYSTERECTOMY  2002   A&P repair/bladder sling    There were no vitals filed for this visit.  Subjective Assessment - 09/23/18 1456  Subjective  Pt reports that things are going well. No soreness after her last session.     Pertinent History  osteoporosis, scoliosis    Currently in Pain?  No/denies                       [facility] Adult PT Treatment/Exercise - 09/23/18 0001      Knee/Hip Exercises: Standing   Heel Raises  Both;1 set;15 reps    Hip Abduction  Stengthening;Right;Left;2 sets;15 reps    Abduction Limitations  red TB around ankles     Hip Extension  Stengthening;Right;Left;10 reps;2 sets    Extension Limitations  red TB around ankles     Forward Step Up  Right;Left;1 set;10 reps;Hand Hold: 2    Forward Step Up Limitations  6" box     Other Standing Knee Exercises  step taps 10x        Knee/Hip Exercises: Seated   Long Arc Quad  Strengthening;Right;Left;10 reps;1 set    [employer] Weight  3 lbs.    Sit to Sand  10 reps;without UE support   adductor ball squeeze            PT Education - 09/23/18 1516    Education Details  updated HEP; technique with therex    Person(s) Educated  Patient    Methods  Explanation;Handout    Comprehension  Verbalized understanding;Returned demonstration       PT Short Term Goals - 08/28/18 2026      PT SHORT TERM GOAL #1   Title  The patient will demonstrate compliance with an initial HEP for LE strengthening    Time  4    Period  Weeks    Status  New    Target Date  09/25/18      PT SHORT TERM GOAL #2   Title  The patient will have improved left knee ROM 3-120 degrees needed for improved mobility with sit to stand and other ADLs    Time  4    Period  Weeks    Status  New      PT SHORT TERM GOAL #3   Title  The patient will demonstrate improved gait distance to 200 feet with cane needed for short distance community ambulation     Time  4    Period  Weeks    Status  New      PT SHORT TERM GOAL #4   Title  BERG balance test improved to 38/56 indicating improved balance and decreased risk of falls    Time  4    Period  Weeks    Status  New      PT SHORT TERM GOAL #5   Title  Gait speed with Timed up and Go improved to 13 sec indicating improved gait safety    Time  4    Period  Weeks    Status  New        PT Long Term Goals - 09/12/18 1148      PT LONG TERM GOAL #1   Title  The patient will be independent in safe self progression of HEP needed for further improvements in strength and balance    Time  8    Period  Weeks    Status  New      PT LONG TERM GOAL #2   Title  The patient will be able to ambulate 300 feet with cane needed for community ambulation  Time  8    Period  Weeks    Status  New      PT LONG TERM GOAL #3   Title  The patient will have improved LE strength bilaterally to  at least 4-/5 to 4/5 for greater ease and safety with ascending and descend stairs at her son's group home    Time  8    Period  Weeks    Status  New      PT LONG TERM GOAL #4   Title  Improved gait speed with 10 meter walk to 9 sec indicating improved gait safety     Time  8    Period  Weeks    Status  New      PT LONG TERM GOAL #5   Title  BERG balance test to 41/56 indicating improved dynamic balance and safety    Time  8    Period  Weeks    Status  New      Additional Long Term Goals   Additional Long Term Goals  Yes      PT LONG TERM GOAL #6   Title  Pt will be able to ambulate greater than 1300 ft on to reflect improvements in endurance and stability with her daily walks.    Time  8    Period  Weeks    Status  New            Plan - 09/23/18 1517    Clinical Impression Statement  Pt continues to complete HEP without difficulty. She denied soreness following last session so therapist increased sets and resistance with several standing exercises. Pt took intermittent rest breaks, but denied any increase in pain or muscle fatigue. Pt's balance remains an area of concern as she relies heavily on UE support during her standing activities. Will continue with current POC.    Rehab Potential  Good    PT Frequency  2x / week    PT Treatment/Interventions  ADLs/Self Care Home Management;Cryotherapy;Functional mobility training;Gait training;Stair training;Therapeutic activities;Therapeutic exercise;Patient/family education;Neuromuscular re-education;Balance training;Manual techniques;Taping    PT Next Visit Plan  review goals; possible green TB for HEP; progress LE strength and flexibility; balance activity Nu-step; ongoing practice on steps/curbs    PT Home Exercise Plan   Access Code: [email or web address]        Patient will benefit from skilled therapeutic intervention in order to improve the following deficits and impairments:  Pain, Difficulty walking, Impaired perceived  functional ability, Decreased range of motion, Decreased strength, Decreased activity tolerance, Decreased balance, Postural dysfunction  Visit Diagnosis: Muscle weakness (generalized)  Difficulty in walking, not elsewhere classified  Unspecified lack of coordination  Acute pain of left knee     Problem List Patient Active Problem List   Diagnosis Date Noted  . HH (hiatus hernia): RECURRENT s/p repair 08/04/2013  . Protein calorie malnutrition (HCC) 07/29/2013  . Hemorrhoids grade 3 prolapsing 10/02/2011  . Chronic pancreatitis (HCC) 02/17/2009  . Irritable bowel syndrome 02/17/2009  . OSTEOPOROSIS 06/08/2008  . GLAUCOMA 02/25/2008  . GERD 02/25/2008  . THORACOLUMBAR SCOLIOSIS, MILD 02/25/2008    4:21 PM,09/23/18 Donita Brooks PT, DPT [facility]  [phone]  [facility] [address], [address] [place], [place], [address] Phone: [phone]   Fax:  [phone]  Name: [name] MRN: [id number] Date of Birth: 12/05/1940

## 2018-09-23 NOTE — Patient Instructions (Addendum)
Access Code: [email or web address]  URL: [email or web address]  Date: 09/23/2018  Prepared by: Donita Brooks   Exercises  Seated Long Arc Quad - 10 reps - 2 sets - 1x daily - 7x weekly  Seated March - 10 reps - 2 sets - 1x daily - 7x weekly  Seated Hip Abduction - 10 reps - 3 sets - 1x daily - 7x weekly  Seated Hamstring Stretch - 3 sets - 20 hold - 1x daily - 7x weekly  Sit to Stand - 10 reps - 3 sets - 1x daily - 7x weekly  Heel rises with counter support - 10 reps - 3 sets - 1x daily - 7x weekly  Standing Hip Abduction with Resistance at Thighs - 10 reps - 1 sets - 1x daily - 7x weekly  Standing Hip Extension Kicks - 10 reps - 1 sets - 1x daily - 7x weekly     [facility] [address], [address] [place], [place] [address] Phone # [phone] Fax [phone]

## 2018-09-25 ENCOUNTER — Ambulatory Visit: Payer: Medicare Other | Admitting: Physical Therapy

## 2018-09-25 ENCOUNTER — Encounter: Payer: Self-pay | Admitting: Physical Therapy

## 2018-09-25 DIAGNOSIS — M6281 Muscle weakness (generalized): Secondary | ICD-10-CM | POA: Diagnosis not present

## 2018-09-25 DIAGNOSIS — M25562 Pain in left knee: Secondary | ICD-10-CM

## 2018-09-25 DIAGNOSIS — R262 Difficulty in walking, not elsewhere classified: Secondary | ICD-10-CM

## 2018-09-25 DIAGNOSIS — R279 Unspecified lack of coordination: Secondary | ICD-10-CM

## 2018-09-25 NOTE — Therapy (Signed)
[facility] [address], [address] [place], [place], [address] Phone: [phone]   Fax:  [phone]  Physical Therapy Treatment  Patient Details  Name: [name] MRN: [id number] Date of Birth: 06/14/1941 Referring Provider (PT): Dr. Shaune Pollackonna Gates   Encounter Date: 09/25/2018  PT End of Session - 09/25/18 1522    Visit Number  6    Date for PT Re-Evaluation  10/23/18    Authorization Type  [employer]HC Medicare     PT Start Time  1454   Pt arrived late   PT Stop Time  1528    PT Time Calculation (min)  34 min    Activity Tolerance  Patient tolerated treatment well;No increased pain    Behavior During Therapy  WFL for tasks assessed/performed       Past Medical History:  Diagnosis Date  . Abscessed tooth    STATES TOOTH PULLED RIGHT LOWER MOLAR   AND LEFT LOWER MOLAR ON3/25/13 AND PT ON ANTIBIOTIC  . Acute respiratory failure with hypoxia (HCC) 07/28/2013  . Bladder spasms   . Blood transfusion    HEMORRHAGED  1 WEEK AFTER COLONOSCOPY  -REQUIRED TRANSFUSION  . Chronic pancreatitis (HCC)   . Diverticulosis   . Dry eye syndrome   . Dry mouth   . 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 GERD (gastroesophageal reflux disease)   . GI bleed   . Glaucoma   . Glaucoma   . Hiatal hernia    LARGE HIATAL HERNIA PER CXR REPORT  . Hypertension   . IBS (irritable bowel syndrome)   . Internal hemorrhoids   . Osteoporosis   . Overactive bladder   . Pancreatitis    past history   . Pelvic floor dysfunction    and rectalprolaspe  . Thoracogenic scoliosis of thoracolumbar region    SEVERE  . 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 BAND HEMORRHOIDECTOMY    . BLADDER SUSPENSION  2002   Grapey - w/ hysty  . CATARACT EXTRACTION     bilateral  . COLONOSCOPY  02/03/2008   diverticulosis, internal  hemorrhoids  . ESOPHAGOGASTRODUODENOSCOPY N/A 07/28/2013   Procedure: ESOPHAGOGASTRODUODENOSCOPY (EGD);  Surgeon: Rachael Feeaniel P Jacobs, MD;  [facility];  Service: Endoscopy;  Laterality: N/A;  . ESOPHAGOGASTRODUODENOSCOPY N/A 07/28/2013   Procedure: ESOPHAGOGASTRODUODENOSCOPY (EGD);  Surgeon: Rachael Feeaniel P Jacobs, MD;  [facility];  Service: Endoscopy;  Laterality: N/A;  . EYE SURGERY     LASER OF BOTH EYES  FOR ELEVATED PRESSURE  . FLEXIBLE SIGMOIDOSCOPY  11/11/2008   internal hemorrhoids  . GASTROSTOMY TUBE PLACEMENT  07/30/13  . HIATAL HERNIA REPAIR    . HIATAL HERNIA REPAIR N/A 07/30/2013   Procedure: Exploratory Laparotomy, Lysis of Adhesions, Reduction of Hiatal Hernia, Repair of Esophogeal Perforations, Dor Fundal Plication, Evacuation of Gastric Bezoar, Gastrostomy Tube Insertion, Upper Endoscopy by Dr. Corliss Skainssuei;  Surgeon: Adolph Pollackodd J Rosenbower, MD;  Location: [facility];  Service: General;  Laterality: N/A;  . MOUTH SURGERY    . MOUTH SURGERY    . NISSEN FUNDOPLICATION    . STAPLE HEMORRHOIDECTOMY    . UPPER GASTROINTESTINAL ENDOSCOPY  02/12/2008   hiatal henia  . VAGINAL HYSTERECTOMY  2002   A&P repair/bladder sling    There were no vitals filed for this visit.  Subjective Assessment - 09/25/18 1457  Subjective  Pt states that she was not sore after her last session.     Pertinent History  osteoporosis, scoliosis    Currently in Pain?  No/denies                       [facility] Adult PT Treatment/Exercise - 09/25/18 0001      Knee/Hip Exercises: Standing   Hip Abduction  Stengthening;2 sets;10 reps;Knee straight    Abduction Limitations  green TB around ankles     Forward Step Up  Both;1 set;10 reps;Hand Hold: 1;Step Height: 6"    Other Standing Knee Exercises  alternating 6" step taps with 1 UE support      Knee/Hip Exercises: Seated   Long Arc Quad  Strengthening;Both;2 sets;10 reps    Long Arc Quad Weight  4 lbs.    Clamshell with Carman Ching    2x15 reps          Balance Exercises - 09/25/18 1515      Balance Exercises: Standing   Standing Eyes Opened  Narrow base of support (BOS);2 reps;30 secs;Foam/compliant surface    Standing Eyes Closed  Narrow base of support (BOS);Foam/compliant surface;2 reps;20 secs        PT Education - 09/25/18 1528    Education Details  updated HEP    Person(s) Educated  Patient    Methods  Explanation;Handout    Comprehension  Verbalized understanding;Returned demonstration       PT Short Term Goals - 09/25/18 1509      PT SHORT TERM GOAL #1   Title  The patient will demonstrate compliance with an initial HEP for LE strengthening    Time  4    Period  Weeks    Status  Achieved      PT SHORT TERM GOAL #2   Title  The patient will have improved left knee ROM 3-120 degrees needed for improved mobility with sit to stand and other ADLs    Time  4    Period  Weeks    Status  New      PT SHORT TERM GOAL #3   Title  The patient will demonstrate improved gait distance to 200 feet with cane needed for short distance community ambulation     Time  4    Period  Weeks    Status  Achieved      PT SHORT TERM GOAL #4   Title  BERG balance test improved to 38/56 indicating improved balance and decreased risk of falls    Time  4    Period  Weeks    Status  New      PT SHORT TERM GOAL #5   Title  Gait speed with Timed up and Go improved to 13 sec indicating improved gait safety    Baseline  13 sec holding SPC     Time  4    Period  Weeks    Status  Achieved        PT Long Term Goals - 09/25/18 1510      PT LONG TERM GOAL #1   Title  The patient will be independent in safe self progression of HEP needed for further improvements in strength and balance    Time  8    Period  Weeks    Status  New      PT LONG TERM GOAL #2   Title  The patient will be able to ambulate 300  feet with cane needed for community ambulation    Time  8    Period  Weeks    Status  New      PT LONG  TERM GOAL #3   Title  The patient will have improved LE strength bilaterally to at least 4-/5 to 4/5 for greater ease and safety with ascending and descend stairs at her son's group home    Time  8    Period  Weeks    Status  New      PT LONG TERM GOAL #4   Title  Improved gait speed with 10 meter walk to 9 sec indicating improved gait safety     Time  8    Period  Weeks    Status  New      PT LONG TERM GOAL #5   Title  BERG balance test to 41/56 indicating improved dynamic balance and safety    Time  8    Period  Weeks    Status  New      PT LONG TERM GOAL #6   Title  Pt will be able to ambulate greater than 1300 ft on to reflect improvements in endurance and stability with her daily walks.    Time  8    Period  Weeks    Status  New            Plan - 09/25/18 1528    Clinical Impression Statement  Pt continues to make progress towards her goals. She was able to complete TUG in 13 sec, minimally using her SPC. She denies any knee pain and notes that she is now able to use the stairs at her backdoor without any difficulty. Pt continues to work diligently on her HEP and denies soreness despite therapist progressions in resistance and sets/reps over the past couple of sessions. Therapist updated HEP to reflect improvements in LE strength. Will continue to progress proprioception and LE strength/endurance moving forward.     Rehab Potential  Good    PT Frequency  2x / week    PT Treatment/Interventions  ADLs/Self Care Home Management;Cryotherapy;Functional mobility training;Gait training;Stair training;Therapeutic activities;Therapeutic exercise;Patient/family education;Neuromuscular re-education;Balance training;Manual techniques;Taping    PT Next Visit Plan  review goals; possible green TB for HEP; progress LE strength and flexibility; balance activity Nu-step; ongoing practice on steps/curbs    PT Home Exercise Plan   Access Code: [email or web address]        Patient will benefit from  skilled therapeutic intervention in order to improve the following deficits and impairments:  Pain, Difficulty walking, Impaired perceived functional ability, Decreased range of motion, Decreased strength, Decreased activity tolerance, Decreased balance, Postural dysfunction  Visit Diagnosis: Difficulty in walking, not elsewhere classified  Muscle weakness (generalized)  Unspecified lack of coordination  Acute pain of left knee     Problem List Patient Active Problem List   Diagnosis Date Noted  . HH (hiatus hernia): RECURRENT s/p repair 08/04/2013  . Protein calorie malnutrition (HCC) 07/29/2013  . Hemorrhoids grade 3 prolapsing 10/02/2011  . Chronic pancreatitis (HCC) 02/17/2009  . Irritable bowel syndrome 02/17/2009  . OSTEOPOROSIS 06/08/2008  . GLAUCOMA 02/25/2008  . GERD 02/25/2008  . THORACOLUMBAR SCOLIOSIS, MILD 02/25/2008   4:01 PM,09/25/18 Donita Brooks PT, DPT [facility]  [phone]  [facility] [address] [place], [place], [address] Phone: [phone]   Fax:  [phone]  Name: [name] MRN: [id number]  Date of Birth: 1940/09/11

## 2018-09-25 NOTE — Patient Instructions (Signed)
Access Code: [email or web address]  URL: [email or web address]  Date: 09/25/2018  Prepared by: Donita Brooks   Exercises  Seated Long Arc Quad - 10 reps - 2 sets - 1x daily - 7x weekly  Seated March - 10 reps - 2 sets - 1x daily - 7x weekly  Seated Hip Abduction - 10 reps - 3 sets - 1x daily - 7x weekly  Seated Hamstring Stretch - 3 sets - 20 hold - 1x daily - 7x weekly  Sit to Stand - 10 reps - 3 sets - 1x daily - 7x weekly  Heel rises with counter support - 10 reps - 3 sets - 1x daily - 7x weekly  Standing Hip Abduction with Resistance at Thighs - 10 reps - 1 sets - 1x daily - 7x weekly  Standing Hip Extension Kicks - 10 reps - 1 sets - 1x daily - 7x weekly    [facility] [address] [place], [place] [address] Phone # [phone] Fax [phone]

## 2018-09-30 ENCOUNTER — Encounter: Payer: Self-pay | Admitting: Physical Therapy

## 2018-09-30 ENCOUNTER — Ambulatory Visit: Payer: Medicare Other | Admitting: Physical Therapy

## 2018-09-30 DIAGNOSIS — R262 Difficulty in walking, not elsewhere classified: Secondary | ICD-10-CM

## 2018-09-30 DIAGNOSIS — M6281 Muscle weakness (generalized): Secondary | ICD-10-CM | POA: Diagnosis not present

## 2018-09-30 DIAGNOSIS — R279 Unspecified lack of coordination: Secondary | ICD-10-CM

## 2018-09-30 DIAGNOSIS — M25562 Pain in left knee: Secondary | ICD-10-CM

## 2018-09-30 NOTE — Therapy (Signed)
[facility] [address] [place], [place], [address] Phone: [phone]   Fax:  [phone]  Physical Therapy Treatment  Patient Details  Name: [name] MRN: [id number] Date of Birth: 08-Aug-1941 Referring Provider (PT): Dr. Darcus Austin   Encounter Date: 09/30/2018  PT End of Session - 09/30/18 1542    Visit Number  7    Date for PT Re-Evaluation  10/23/18    Authorization Type  [employer] Medicare     PT Start Time  8786    PT Stop Time  1530    PT Time Calculation (min)  38 min    Activity Tolerance  Patient tolerated treatment well       Past Medical History:  Diagnosis Date  . Abscessed tooth    STATES TOOTH PULLED RIGHT LOWER MOLAR   AND LEFT LOWER MOLAR ON3/25/13 AND PT ON ANTIBIOTIC  . Acute respiratory failure with hypoxia ([place]) 07/28/2013  . Bladder spasms   . Blood transfusion    HEMORRHAGED  1 WEEK AFTER COLONOSCOPY  -REQUIRED TRANSFUSION  . Chronic pancreatitis ([place])   . Diverticulosis   . Dry eye syndrome   . Dry mouth   . 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 GERD (gastroesophageal reflux disease)   . GI bleed   . Glaucoma   . Glaucoma   . Hiatal hernia    LARGE HIATAL HERNIA PER CXR REPORT  . Hypertension   . IBS (irritable bowel syndrome)   . Internal hemorrhoids   . Osteoporosis   . Overactive bladder   . Pancreatitis    past history   . Pelvic floor dysfunction    and rectalprolaspe  . Thoracogenic scoliosis of thoracolumbar region    SEVERE  . Volvulus ([place]) 08/04/2013    Past Surgical History:  Procedure Laterality Date  . BAND HEMORRHOIDECTOMY    . BLADDER SUSPENSION  2002   Grapey - w/ hysty  . CATARACT EXTRACTION     bilateral  . COLONOSCOPY  02/03/2008   diverticulosis, internal hemorrhoids  . ESOPHAGOGASTRODUODENOSCOPY N/A 07/28/2013   Procedure: ESOPHAGOGASTRODUODENOSCOPY  (EGD);  Surgeon: Milus Banister, MD;  Location: [facility];  Service: Endoscopy;  Laterality: N/A;  . ESOPHAGOGASTRODUODENOSCOPY N/A 07/28/2013   Procedure: ESOPHAGOGASTRODUODENOSCOPY (EGD);  Surgeon: Milus Banister, MD;  Location: [facility];  Service: Endoscopy;  Laterality: N/A;  . EYE SURGERY     LASER OF BOTH EYES  FOR ELEVATED PRESSURE  . FLEXIBLE SIGMOIDOSCOPY  11/11/2008   internal hemorrhoids  . GASTROSTOMY TUBE PLACEMENT  07/30/13  . HIATAL HERNIA REPAIR    . HIATAL HERNIA REPAIR N/A 07/30/2013   Procedure: Exploratory Laparotomy, Lysis of Adhesions, Reduction of Hiatal Hernia, Repair of Esophogeal Perforations, Dor Fundal Plication, Evacuation of Gastric Bezoar, Gastrostomy Tube Insertion, Upper Endoscopy by Dr. Georgette Dover;  Surgeon: Odis Hollingshead, MD;  Location: [facility];  Service: General;  Laterality: N/A;  . MOUTH SURGERY    . MOUTH SURGERY    . NISSEN FUNDOPLICATION    . STAPLE HEMORRHOIDECTOMY    . UPPER GASTROINTESTINAL ENDOSCOPY  02/12/2008   hiatal henia  . VAGINAL HYSTERECTOMY  2002   A&P repair/bladder sling    There were no vitals filed for this visit.  Subjective Assessment - 09/30/18 1455    Subjective  Things are going good.   States she slept until noon.  I ain't got  time for no pain.  I think my balance is going good.  I can go up and down the steps now like normal.  States she uses her cane full time.      Pertinent History  osteoporosis, scoliosis   Goes by "[name]"    Patient Stated Goals  Get my knee so I can go without a cane    Currently in Pain?  No/denies    Pain Score  0-No pain         [facility] PT Assessment - 09/30/18 0001      AROM   Left Knee Extension  3    Left Knee Flexion  130      Strength   Right Hip Flexion  4-/5    Right Hip ABduction  4-/5    Left Hip Flexion  4-/5    Left Hip ABduction  4-/5    Right Knee Flexion  4-/5    Right Knee Extension  4-/5    Left Knee Flexion  4-/5    Left Knee Extension  4-/5    Right Ankle  Dorsiflexion  4-/5    Right Ankle Plantar Flexion  4-/5    Left Ankle Dorsiflexion  4-/5    Left Ankle Plantar Flexion  4-/5      Berg Balance Test   Sit to Stand  Able to stand without using hands and stabilize independently    Standing Unsupported  Able to stand safely 2 minutes    Sitting with Back Unsupported but Feet Supported on Floor or Stool  Able to sit safely and securely 2 minutes    Stand to Sit  Sits safely with minimal use of hands    Transfers  Able to transfer safely, minor use of hands    Standing Unsupported with Eyes Closed  Able to stand 10 seconds with supervision    Standing Ubsupported with Feet Together  Able to place feet together independently and stand for 1 minute with supervision    From Standing, Reach Forward with Outstretched Arm  Can reach forward >5 cm safely (2")    From Standing Position, Pick up Object from Floor  Able to pick up shoe safely and easily    From Standing Position, Turn to Look Behind Over each Shoulder  Looks behind one side only/other side shows less weight shift    Turn 360 Degrees  Able to turn 360 degrees safely but slowly    Standing Unsupported, Alternately Place Feet on Step/Stool  Able to complete 4 steps without aid or supervision    Standing Unsupported, One Foot in Front  Able to plae foot ahead of the other independently and hold 30 seconds    Standing on One Leg  Tries to lift leg/unable to hold 3 seconds but remains standing independently    Total Score  43                   [facility] Adult PT Treatment/Exercise - 09/30/18 0001      Knee/Hip Exercises: Aerobic   Nustep  seat 7 8 min L2   while discussing progress     Knee/Hip Exercises: Standing   Forward Step Up  Right;Left;1 set;5 reps;Hand Hold: 2;Step Height: 6"    Rebounder  weight shifting 3 ways and marching 1 minute each     Walking with Sports Cord  green band resisted backward walk 10x     Other Standing Knee Exercises  stepping up and over 4 inch  step 10x      Knee/Hip Exercises: Seated   Long Arc Quad  Strengthening;Both;2 sets;10 reps    Long Arc Quad Weight  4 lbs.               PT Short Term Goals - 09/30/18 1512      PT SHORT TERM GOAL #1   Title  The patient will demonstrate compliance with an initial HEP for LE strengthening    Status  Achieved      PT SHORT TERM GOAL #2   Title  The patient will have improved left knee ROM 3-120 degrees needed for improved mobility with sit to stand and other ADLs    Status  Achieved      PT SHORT TERM GOAL #3   Title  The patient will demonstrate improved gait distance to 200 feet with cane needed for short distance community ambulation     Status  Achieved      PT SHORT TERM GOAL #4   Title  BERG balance test improved to 38/56 indicating improved balance and decreased risk of falls    Status  Achieved      PT SHORT TERM GOAL #5   Title  Gait speed with Timed up and Go improved to 13 sec indicating improved gait safety    Status  Achieved        PT Long Term Goals - 09/30/18 1545      PT LONG TERM GOAL #1   Title  The patient will be independent in safe self progression of HEP needed for further improvements in strength and balance    Time  8    Period  Weeks    Status  On-going      PT LONG TERM GOAL #2   Title  The patient will be able to ambulate 300 feet with cane needed for community ambulation    Time  8    Period  Weeks    Status  On-going      PT LONG TERM GOAL #3   Title  The patient will have improved LE strength bilaterally to at least 4-/5 to 4/5 for greater ease and safety with ascending and descend stairs at her son's group home    Time  8    Period  Weeks    Status  On-going      PT LONG TERM GOAL #4   Title  Improved gait speed with 10 meter walk to 9 sec indicating improved gait safety     Time  8    Period  Weeks    Status  On-going      PT LONG TERM GOAL #5   Title  BERG balance test to 41/56 indicating improved dynamic balance  and safety    Status  Achieved      PT LONG TERM GOAL #6   Title  Pt will be able to ambulate greater than 1300 ft on 6MWT to reflect improvements in endurance and stability with her daily walks.    Time  8    Period  Weeks    Status  On-going            Plan - 09/30/18 1542    Clinical Impression Statement  The patient has made good improvements in left knee ROM and strength.  Her BERG balance score has improved 9 points in the past month.  Fewer verbal cues needed to avoid looking down at her feet with standing and  walking.  She denies pain during treatment session.  Therapist providing close supervision for safety but no major losses of balance.  All STGS met.      Rehab Potential  Good    PT Frequency  2x / week    PT Duration  8 weeks    PT Treatment/Interventions  ADLs/Self Care Home Management;Cryotherapy;Functional mobility training;Gait training;Stair training;Therapeutic activities;Therapeutic exercise;Patient/family education;Neuromuscular re-education;Balance training;Manual techniques;Taping    PT Next Visit Plan   possible green TB for HEP; progress LE strength and flexibility; balance activity Nu-step; ongoing practice on steps/curbs;  check 80mwalk test and 6 min walk test next week    PT Home Exercise Plan   Access Code: [email or web address]       Patient will benefit from skilled therapeutic intervention in order to improve the following deficits and impairments:  Pain, Difficulty walking, Impaired perceived functional ability, Decreased range of motion, Decreased strength, Decreased activity tolerance, Decreased balance, Postural dysfunction  Visit Diagnosis: Difficulty in walking, not elsewhere classified  Muscle weakness (generalized)  Unspecified lack of coordination  Acute pain of left knee     Problem List Patient Active Problem List   Diagnosis Date Noted  . HH (hiatus hernia): RECURRENT s/p repair 08/04/2013  . Protein calorie [place] ([place] 07/29/2013   . Hemorrhoids grade 3 prolapsing 10/02/2011  . [place] pancreatitis ([place] 02/17/2009  . Irritable bowel syndrome 02/17/2009  . OSTEOPOROSIS 06/08/2008  . GLAUCOMA 02/25/2008  . GERD 02/25/2008  . THORACOLUMBAR SCOLIOSIS, MILD 02/25/2008   SRuben Im PT 09/30/18 3:47 PM Phone: [phone]: [phone] Singh1/21/2020, 3:47 PM  [facility] [address] [place] [address]Phone: [phone]  Fax:  [phone] Name: Angela ELTRINGHAMMRN: [id number] of Birth: 9Feb 02, 1942

## 2018-10-03 ENCOUNTER — Encounter: Payer: Self-pay | Admitting: Physical Therapy

## 2018-10-03 ENCOUNTER — Ambulatory Visit: Payer: Medicare Other | Admitting: Physical Therapy

## 2018-10-03 DIAGNOSIS — M6281 Muscle weakness (generalized): Secondary | ICD-10-CM | POA: Diagnosis not present

## 2018-10-03 DIAGNOSIS — M25562 Pain in left knee: Secondary | ICD-10-CM

## 2018-10-03 DIAGNOSIS — R262 Difficulty in walking, not elsewhere classified: Secondary | ICD-10-CM

## 2018-10-03 DIAGNOSIS — R279 Unspecified lack of coordination: Secondary | ICD-10-CM

## 2018-10-03 NOTE — Therapy (Signed)
[facility] [address] [place], [place], [address] Phone: [phone]   Fax:  [phone]  Physical Therapy Treatment  Patient Details  Name: [name] MRN: [id number] Date of Birth: 01/04/1941 Referring Provider (PT): Dr. [name]   Encounter Date: 10/03/2018  PT End of Session - 10/03/18 1033    Visit Number  8    Date for PT Re-Evaluation  10/23/18    Authorization Type  [employer]HC Medicare     PT Start Time  1027   pt late   PT Stop Time  1105    PT Time Calculation (min)  38 min    Activity Tolerance  Patient tolerated treatment well       Past Medical History:  Diagnosis Date  . Abscessed tooth    STATES TOOTH PULLED RIGHT LOWER MOLAR   AND LEFT LOWER MOLAR ON3/25/13 AND PT ON ANTIBIOTIC  . Acute respiratory failure with hypoxia ([facility]) 07/28/2013  . Bladder spasms   . Blood transfusion    HEMORRHAGED  1 WEEK AFTER COLONOSCOPY  -REQUIRED TRANSFUSION  . Chronic pancreatitis ([facility])   . Diverticulosis   . Dry eye syndrome   . Dry mouth   . 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 GERD (gastroesophageal reflux disease)   . GI bleed   . Glaucoma   . Glaucoma   . Hiatal hernia    LARGE HIATAL HERNIA PER CXR REPORT  . Hypertension   . IBS (irritable bowel syndrome)   . Internal hemorrhoids   . Osteoporosis   . Overactive bladder   . Pancreatitis    past history   . Pelvic floor dysfunction    and rectalprolaspe  . Thoracogenic scoliosis of thoracolumbar region    SEVERE  . 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 BAND HEMORRHOIDECTOMY    . BLADDER SUSPENSION  2002   Grapey - w/ hysty  . CATARACT EXTRACTION     bilateral  . COLONOSCOPY  02/03/2008   diverticulosis, internal hemorrhoids  . ESOPHAGOGASTRODUODENOSCOPY N/A 07/28/2013   Procedure:  ESOPHAGOGASTRODUODENOSCOPY (EGD);  Surgeon: Rachael Fee, MD;  Location: [facility];  Service: Endoscopy;  Laterality: N/A;  . ESOPHAGOGASTRODUODENOSCOPY N/A 07/28/2013   Procedure: ESOPHAGOGASTRODUODENOSCOPY (EGD);  Surgeon: Rachael Fee, MD;  Location: [facility];  Service: Endoscopy;  Laterality: N/A;  . EYE SURGERY     LASER OF BOTH EYES  FOR ELEVATED PRESSURE  . FLEXIBLE SIGMOIDOSCOPY  11/11/2008   internal hemorrhoids  . GASTROSTOMY TUBE PLACEMENT  07/30/13  . HIATAL HERNIA REPAIR    . HIATAL HERNIA REPAIR N/A 07/30/2013   Procedure: Exploratory Laparotomy, Lysis of Adhesions, Reduction of Hiatal Hernia, Repair of Esophogeal Perforations, Dor Fundal Plication, Evacuation of Gastric Bezoar, Gastrostomy Tube Insertion, Upper Endoscopy by Dr. [name];  Surgeon: Adolph Pollack, MD;  Location: [facility];  Service: General;  Laterality: N/A;  . MOUTH SURGERY    . MOUTH SURGERY    . NISSEN FUNDOPLICATION    . STAPLE HEMORRHOIDECTOMY    . UPPER GASTROINTESTINAL ENDOSCOPY  02/12/2008   hiatal henia  . VAGINAL HYSTERECTOMY  2002   A&P repair/bladder sling    There were no vitals filed for this visit.  Subjective Assessment - 10/03/18 1032    Subjective  Patient arrives late.  My balance has been good even with rushing  to get here.      Pertinent History  osteoporosis, scoliosis   Goes by "[name]"    Currently in Pain?  No/denies    Pain Score  0-No pain         [facility] PT Assessment - 10/03/18 0001      Standardized Balance Assessment   10 Meter Walk  9.16                   [facility] Adult PT Treatment/Exercise - 10/03/18 0001      Therapeutic Activites    Therapeutic Activities  Other Therapeutic Activities    Other Therapeutic Activities  up and down steps 2 hands 1x;  up and down steps with 1 railing only       Neuro Re-ed    Neuro Re-ed Details   sidestepping across the line to the side and backward ; balance on rebounder with UE elevations forward and overhead on  unstable surface      Knee/Hip Exercises: Aerobic   Nustep  seat 7 10 min L2   while discussing progress     Knee/Hip Exercises: Standing   Heel Raises  Both;1 set;10 reps    Hip Abduction  AROM;Right;Left;10 reps    Hip Extension  Stengthening;Right;Left;10 reps    Extension Limitations  red band     Forward Step Up  Right;Left;1 set;5 reps;Hand Hold: 2;Step Height: 6"    Rebounder  weight shifting 3 ways and marching 1 minute each     Walking with Sports Cord  green band resisted backward walk 10x       Knee/Hip Exercises: Seated   Long Arc Quad  Strengthening;Both;2 sets;10 reps    Long Arc Quad Weight  4 lbs.    Sit to Sand  10 reps;without UE support               PT Short Term Goals - 09/30/18 1512      PT SHORT TERM GOAL #1   Title  The patient will demonstrate compliance with an initial HEP for LE strengthening    Status  Achieved      PT SHORT TERM GOAL #2   Title  The patient will have improved left knee ROM 3-120 degrees needed for improved mobility with sit to stand and other ADLs    Status  Achieved      PT SHORT TERM GOAL #3   Title  The patient will demonstrate improved gait distance to 200 feet with cane needed for short distance community ambulation     Status  Achieved      PT SHORT TERM GOAL #4   Title  BERG balance test improved to 38/56 indicating improved balance and decreased risk of falls    Status  Achieved      PT SHORT TERM GOAL #5   Title  Gait speed with Timed up and Go improved to 13 sec indicating improved gait safety    Status  Achieved        PT Long Term Goals - 10/03/18 1048      PT LONG TERM GOAL #1   Title  The patient will be independent in safe self progression of HEP needed for further improvements in strength and balance    Period  Weeks    Status  On-going      PT LONG TERM GOAL #2   Title  The patient will be able to ambulate 300 feet with cane needed for community ambulation  Time  8    Period  Weeks     Status  On-going      PT LONG TERM GOAL #3   Title  The patient will have improved LE strength bilaterally to at least 4-/5 to 4/5 for greater ease and safety with ascending and descend stairs at her son's group home    Time  8    Period  Weeks    Status  On-going      PT LONG TERM GOAL #4   Title  Improved gait speed with 10 meter walk to 9 sec indicating improved gait safety     Status  Achieved      PT LONG TERM GOAL #5   Title  BERG balance test to 41/56 indicating improved dynamic balance and safety    Status  Achieved      PT LONG TERM GOAL #6   Title  Pt will be able to ambulate greater than 1300 ft on to reflect improvements in endurance and stability with her daily walks.    Time  8    Period  Weeks    Status  On-going            Plan - 10/03/18 1034    Clinical Impression Statement  Much improved gait speed with 10 m walk test.  She is able to rise sit to stand from a standard chair without UE assist with ease.  She is able to go up and down steps with light UE resting on railing for safety.  She is progressing with rehab goals and should meet remaining LTGs in 2 more visits.  Therapist providing intermittent CGA for safety with balance challenges.  No complaints of pain before, during or after treatment session.      Rehab Potential  Good    PT Frequency  2x / week    PT Duration  8 weeks    PT Treatment/Interventions  ADLs/Self Care Home Management;Cryotherapy;Functional mobility training;Gait training;Stair training;Therapeutic activities;Therapeutic exercise;Patient/family education;Neuromuscular re-education;Balance training;Manual techniques;Taping    PT Next Visit Plan  progress LE strength and flexibility; balance activity Nu-step; ongoing practice on steps/curbs;  check 8m walk test and 6 min walk test next week    PT Home Exercise Plan   Access Code: [email or web address]        Patient will benefit from skilled therapeutic intervention in order to improve the  following deficits and impairments:  Pain, Difficulty walking, Impaired perceived functional ability, Decreased range of motion, Decreased strength, Decreased activity tolerance, Decreased balance, Postural dysfunction  Visit Diagnosis: Difficulty in walking, not elsewhere classified  Muscle weakness (generalized)  Unspecified lack of coordination  Acute pain of left knee     Problem List Patient Active Problem List   Diagnosis Date Noted  . HH (hiatus hernia): RECURRENT s/p repair 08/04/2013  . Protein calorie malnutrition ([facility]) 07/29/2013  . Hemorrhoids grade 3 prolapsing 10/02/2011  . Chronic pancreatitis ([facility]) 02/17/2009  . Irritable bowel syndrome 02/17/2009  . OSTEOPOROSIS 06/08/2008  . GLAUCOMA 02/25/2008  . GERD 02/25/2008  . THORACOLUMBAR SCOLIOSIS, MILD 02/25/2008   Lavinia Sharps, PT 10/03/18 11:17 AM Phone: [phone] Fax: [phone] Vivien Presto 10/03/2018, 11:17 AM  [facility] [address] [place], [place], [address] Phone: [phone]   Fax:  [phone]  Name: NADEIGE GOOSBY MRN: [id number] Date of Birth: [birth year]

## 2018-10-06 ENCOUNTER — Other Ambulatory Visit: Payer: Self-pay | Admitting: Family Medicine

## 2018-10-06 DIAGNOSIS — M7989 Other specified soft tissue disorders: Secondary | ICD-10-CM

## 2018-10-07 ENCOUNTER — Ambulatory Visit
Admission: RE | Admit: 2018-10-07 | Discharge: 2018-10-07 | Disposition: A | Discharge status: Home / Self Care | Payer: Medicare Other | Source: Ambulatory Visit | Attending: Family Medicine | Admitting: Family Medicine

## 2018-10-07 ENCOUNTER — Ambulatory Visit: Payer: Medicare Other | Admitting: Physical Therapy

## 2018-10-07 ENCOUNTER — Other Ambulatory Visit: Payer: Self-pay | Admitting: Family Medicine

## 2018-10-07 ENCOUNTER — Encounter: Payer: Self-pay | Admitting: Physical Therapy

## 2018-10-07 DIAGNOSIS — M7989 Other specified soft tissue disorders: Secondary | ICD-10-CM

## 2018-10-07 DIAGNOSIS — M25562 Pain in left knee: Secondary | ICD-10-CM

## 2018-10-07 DIAGNOSIS — M6281 Muscle weakness (generalized): Secondary | ICD-10-CM | POA: Diagnosis not present

## 2018-10-07 DIAGNOSIS — R279 Unspecified lack of coordination: Secondary | ICD-10-CM

## 2018-10-07 DIAGNOSIS — R262 Difficulty in walking, not elsewhere classified: Secondary | ICD-10-CM

## 2018-10-07 NOTE — Therapy (Signed)
[facility] [address], [address] [place], [place], [address] Phone: [phone]   Fax:  [phone]  Physical Therapy Treatment  Patient Details  Name: [name] MRN: [id number] Date of Birth: 05/13/1941 Referring Provider (PT): Dr. Shaune Pollackonna Gates   Encounter Date: 10/07/2018  PT End of Session - 10/07/18 1517    Visit Number  9    Date for PT Re-Evaluation  10/23/18    Authorization Type  UHC Medicare     PT Start Time  1444    PT Stop Time  1522    PT Time Calculation (min)  38 min    Activity Tolerance  Patient tolerated treatment well       Past Medical History:  Diagnosis Date  . Abscessed tooth    STATES TOOTH PULLED RIGHT LOWER MOLAR   AND LEFT LOWER MOLAR ON3/25/13 AND PT ON ANTIBIOTIC  . Acute respiratory failure with hypoxia (HCC) 07/28/2013  . Bladder spasms   . Blood transfusion    HEMORRHAGED  1 WEEK AFTER COLONOSCOPY  -REQUIRED TRANSFUSION  . Chronic pancreatitis (HCC)   . Diverticulosis   . Dry eye syndrome   . Dry mouth   . 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 GERD (gastroesophageal reflux disease)   . GI bleed   . Glaucoma   . Glaucoma   . Hiatal hernia    LARGE HIATAL HERNIA PER CXR REPORT  . Hypertension   . IBS (irritable bowel syndrome)   . Internal hemorrhoids   . Osteoporosis   . Overactive bladder   . Pancreatitis    past history   . Pelvic floor dysfunction    and rectalprolaspe  . Thoracogenic scoliosis of thoracolumbar region    SEVERE  . 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 BAND HEMORRHOIDECTOMY    . BLADDER SUSPENSION  2002   Grapey - w/ hysty  . CATARACT EXTRACTION     bilateral  . COLONOSCOPY  02/03/2008   diverticulosis, internal hemorrhoids  . ESOPHAGOGASTRODUODENOSCOPY N/A 07/28/2013   Procedure: ESOPHAGOGASTRODUODENOSCOPY  (EGD);  Surgeon: Rachael Feeaniel P Jacobs, MD;  [facility];  Service: Endoscopy;  Laterality: N/A;  . ESOPHAGOGASTRODUODENOSCOPY N/A 07/28/2013   Procedure: ESOPHAGOGASTRODUODENOSCOPY (EGD);  Surgeon: Rachael Feeaniel P Jacobs, MD;  [facility];  Service: Endoscopy;  Laterality: N/A;  . EYE SURGERY     LASER OF BOTH EYES  FOR ELEVATED PRESSURE  . FLEXIBLE SIGMOIDOSCOPY  11/11/2008   internal hemorrhoids  . GASTROSTOMY TUBE PLACEMENT  07/30/13  . HIATAL HERNIA REPAIR    . HIATAL HERNIA REPAIR N/A 07/30/2013   Procedure: Exploratory Laparotomy, Lysis of Adhesions, Reduction of Hiatal Hernia, Repair of Esophogeal Perforations, Dor Fundal Plication, Evacuation of Gastric Bezoar, Gastrostomy Tube Insertion, Upper Endoscopy by Dr. Corliss Skainssuei;  Surgeon: Adolph Pollackodd J Rosenbower, MD;  Location: [facility];  Service: General;  Laterality: N/A;  . MOUTH SURGERY    . MOUTH SURGERY    . NISSEN FUNDOPLICATION    . STAPLE HEMORRHOIDECTOMY    . UPPER GASTROINTESTINAL ENDOSCOPY  02/12/2008   hiatal henia  . VAGINAL HYSTERECTOMY  2002   A&P repair/bladder sling    There were no vitals filed for this visit.  Subjective Assessment - 10/07/18 1447    Subjective  Had an U/S this morning on her leg b/c of 2 week history of redness  and itching.  I'm getting around really good at home.      Pertinent History  osteoporosis, scoliosis   Goes by "[name]"    Currently in Pain?  No/denies    Pain Score  0-No pain                       [facility] Adult PT Treatment/Exercise - 10/07/18 0001      Therapeutic Activites    Other Therapeutic Activities  up and down steps 2 hands 1x;  up and down steps with 1 railing only       Knee/Hip Exercises: Aerobic   Nustep  seat 7 10 min L2   while discussing progress     Knee/Hip Exercises: Standing   Heel Raises  Both;1 set;10 reps    Forward Step Up  Right;Left;10 reps;Hand Hold: 1;Step Height: 4"    SLS with Vectors  3 ways 10x right/left 1 hand support    Rebounder   weight shifting 3 ways and marching 1 minute each     Walking with Sports Cord  green band resisted backward walk 10x       Knee/Hip Exercises: Seated   Long Arc Quad  Strengthening;Right;Left;10 reps    Long Arc Quad Weight  5 lbs.    Clamshell with TheraBand  Blue   double and single 10x each    Other Seated Knee/Hip Exercises  5# hip flexion 10x right/left     Sit to Sand  2 sets;5 reps;without UE support   blue band around thighs              PT Short Term Goals - 09/30/18 1512      PT SHORT TERM GOAL #1   Title  The patient will demonstrate compliance with an initial HEP for LE strengthening    Status  Achieved      PT SHORT TERM GOAL #2   Title  The patient will have improved left knee ROM 3-120 degrees needed for improved mobility with sit to stand and other ADLs    Status  Achieved      PT SHORT TERM GOAL #3   Title  The patient will demonstrate improved gait distance to 200 feet with cane needed for short distance community ambulation     Status  Achieved      PT SHORT TERM GOAL #4   Title  BERG balance test improved to 38/56 indicating improved balance and decreased risk of falls    Status  Achieved      PT SHORT TERM GOAL #5   Title  Gait speed with Timed up and Go improved to 13 sec indicating improved gait safety    Status  Achieved        PT Long Term Goals - 10/03/18 1048      PT LONG TERM GOAL #1   Title  The patient will be independent in safe self progression of HEP needed for further improvements in strength and balance    Period  Weeks    Status  On-going      PT LONG TERM GOAL #2   Title  The patient will be able to ambulate 300 feet with cane needed for community ambulation    Time  8    Period  Weeks    Status  On-going      PT LONG TERM GOAL #3   Title  The patient will have improved LE strength bilaterally to at  least 4-/5 to 4/5 for greater ease and safety with ascending and descend stairs at her son's group home    Time  8     Period  Weeks    Status  On-going      PT LONG TERM GOAL #4   Title  Improved gait speed with 10 meter walk to 9 sec indicating improved gait safety     Status  Achieved      PT LONG TERM GOAL #5   Title  BERG balance test to 41/56 indicating improved dynamic balance and safety    Status  Achieved      PT LONG TERM GOAL #6   Title  Pt will be able to ambulate greater than 1300 ft on to reflect improvements in endurance and stability with her daily walks.    Time  8    Period  Weeks    Status  On-going            Plan - 10/07/18 1525    Clinical Impression Statement  The patient is able to progress LE strengthening ex's with increased resistance and intensity.  She has no loss of balance.  She is able to ascend and descend steps reciprocally with only light UE support on railing.  Therapist closely monitoring response with all interventions and providing supervision for safety.      Rehab Potential  Good    PT Frequency  2x / week    PT Duration  8 weeks    PT Treatment/Interventions  ADLs/Self Care Home Management;Cryotherapy;Functional mobility training;Gait training;Stair training;Therapeutic activities;Therapeutic exercise;Patient/family education;Neuromuscular re-education;Balance training;Manual techniques;Taping    PT Next Visit Plan   plan for discharge next visit;  10th visit prog note;   check 41m walk test and 6 min walk test and remaining STGS    PT Home Exercise Plan   Access Code: [email or web address]        Patient will benefit from skilled therapeutic intervention in order to improve the following deficits and impairments:  Pain, Difficulty walking, Impaired perceived functional ability, Decreased range of motion, Decreased strength, Decreased activity tolerance, Decreased balance, Postural dysfunction  Visit Diagnosis: Difficulty in walking, not elsewhere classified  Muscle weakness (generalized)  Unspecified lack of coordination  Acute pain of left  knee     Problem List Patient Active Problem List   Diagnosis Date Noted  . HH (hiatus hernia): RECURRENT s/p repair 08/04/2013  . Protein calorie malnutrition (HCC) 07/29/2013  . Hemorrhoids grade 3 prolapsing 10/02/2011  . Chronic pancreatitis (HCC) 02/17/2009  . Irritable bowel syndrome 02/17/2009  . OSTEOPOROSIS 06/08/2008  . GLAUCOMA 02/25/2008  . GERD 02/25/2008  . THORACOLUMBAR SCOLIOSIS, MILD 02/25/2008   Lavinia Sharps, PT 10/07/18 7:08 PM Phone: [phone] Fax: [phone] Vivien Presto 10/07/2018, 7:07 PM  [facility] [address], [address] [place], [place], [address] Phone: [phone]   Fax:  [phone]  Name: [name] MRN: [id number] Date of Birth: 1940-12-01

## 2018-10-09 ENCOUNTER — Ambulatory Visit: Payer: Medicare Other | Admitting: Physical Therapy

## 2018-10-09 DIAGNOSIS — M25562 Pain in left knee: Secondary | ICD-10-CM

## 2018-10-09 DIAGNOSIS — M6281 Muscle weakness (generalized): Secondary | ICD-10-CM

## 2018-10-09 DIAGNOSIS — R262 Difficulty in walking, not elsewhere classified: Secondary | ICD-10-CM

## 2018-10-09 DIAGNOSIS — R279 Unspecified lack of coordination: Secondary | ICD-10-CM

## 2018-10-09 NOTE — Therapy (Signed)
[facility] [address], [place], [place], [address] Phone: [phone]   Fax:  [phone]  Physical Therapy Treatment/Discharge Summary   Patient Details  Name: [name] MRN: [id number] Date of Birth: May 03, 1941 Referring Provider (PT): Dr. Darcus Austin  Progress Note Reporting Period 08/28/18 to 10/09/18  See note below for Objective Data and Assessment of Progress/Goals.       Encounter Date: 10/09/2018  PT End of Session - 10/09/18 1520    Visit Number  10    Date for PT Re-Evaluation  10/23/18    Authorization Type  UHC Medicare     PT Start Time  3646   pt late   PT Stop Time  1534    PT Time Calculation (min)  29 min    Activity Tolerance  Patient tolerated treatment well       Past Medical History:  Diagnosis Date  . Abscessed tooth    STATES TOOTH PULLED RIGHT LOWER MOLAR   AND LEFT LOWER MOLAR ON3/25/13 AND PT ON ANTIBIOTIC  . Acute respiratory failure with hypoxia ([place]) 07/28/2013  . Bladder spasms   . Blood transfusion    HEMORRHAGED  1 WEEK AFTER COLONOSCOPY  -REQUIRED TRANSFUSION  . Chronic pancreatitis ([place])   . Diverticulosis   . Dry eye syndrome   . Dry mouth   . 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 GERD (gastroesophageal reflux disease)   . GI bleed   . Glaucoma   . Glaucoma   . Hiatal hernia    LARGE HIATAL HERNIA PER CXR REPORT  . Hypertension   . IBS (irritable bowel syndrome)   . Internal hemorrhoids   . Osteoporosis   . Overactive bladder   . Pancreatitis    past history   . Pelvic floor dysfunction    and rectalprolaspe  . Thoracogenic scoliosis of thoracolumbar region    SEVERE  . Volvulus ([place]) 08/04/2013    Past Surgical History:  Procedure Laterality Date  . BAND HEMORRHOIDECTOMY    . BLADDER SUSPENSION  2002   Grapey - w/ hysty  . CATARACT EXTRACTION      bilateral  . COLONOSCOPY  02/03/2008   diverticulosis, internal hemorrhoids  . ESOPHAGOGASTRODUODENOSCOPY N/A 07/28/2013   Procedure: ESOPHAGOGASTRODUODENOSCOPY (EGD);  Surgeon: Milus Banister, MD;  Location: [facility];  Service: Endoscopy;  Laterality: N/A;  . ESOPHAGOGASTRODUODENOSCOPY N/A 07/28/2013   Procedure: ESOPHAGOGASTRODUODENOSCOPY (EGD);  Surgeon: Milus Banister, MD;  Location: [facility];  Service: Endoscopy;  Laterality: N/A;  . EYE SURGERY     LASER OF BOTH EYES  FOR ELEVATED PRESSURE  . FLEXIBLE SIGMOIDOSCOPY  11/11/2008   internal hemorrhoids  . GASTROSTOMY TUBE PLACEMENT  07/30/13  . HIATAL HERNIA REPAIR    . HIATAL HERNIA REPAIR N/A 07/30/2013   Procedure: Exploratory Laparotomy, Lysis of Adhesions, Reduction of Hiatal Hernia, Repair of Esophogeal Perforations, Dor Fundal Plication, Evacuation of Gastric Bezoar, Gastrostomy Tube Insertion, Upper Endoscopy by Dr. Georgette Dover;  Surgeon: Odis Hollingshead, MD;  Location: [facility];  Service: General;  Laterality: N/A;  . MOUTH SURGERY    . MOUTH SURGERY    . NISSEN FUNDOPLICATION    . STAPLE HEMORRHOIDECTOMY    . UPPER GASTROINTESTINAL ENDOSCOPY  02/12/2008   hiatal henia  . VAGINAL HYSTERECTOMY  2002   A&P repair/bladder sling    There were no vitals filed for  this visit.  Subjective Assessment - 10/09/18 1505    Subjective  I'm ill b/c I'm late.  She denies any recent falls or feeling unsteady.      How long can you walk comfortably?  20-25 minutes     Currently in Pain?  No/denies    Pain Score  0-No pain         [facility] PT Assessment - 10/09/18 0001      AROM   Right Knee Extension  0    Right Knee Flexion  130    Left Knee Extension  3    Left Knee Flexion  125   sitting     Strength   Right Hip Flexion  4/5    Right Hip ABduction  4/5    Left Hip Flexion  4/5    Left Hip ABduction  4/5    Right Knee Flexion  4/5    Right Knee Extension  4/5    Left Knee Flexion  4/5    Left Knee Extension  4/5     Right Ankle Dorsiflexion  4/5    Right Ankle Plantar Flexion  4/5    Left Ankle Dorsiflexion  4/5    Left Ankle Plantar Flexion  4/5      6 minute walk test results    Endurance additional comments  6 MWT: 1056 ft, pt ended at 56 sec remaining       Standardized Balance Assessment   10 Meter Walk  9.16      Berg Balance Test   Sit to Stand  Able to stand without using hands and stabilize independently    Standing Unsupported  Able to stand safely 2 minutes    Sitting with Back Unsupported but Feet Supported on Floor or Stool  Able to sit safely and securely 2 minutes    Stand to Sit  Sits safely with minimal use of hands    Transfers  Able to transfer safely, minor use of hands    Standing Unsupported with Eyes Closed  Able to stand 10 seconds with supervision    Standing Ubsupported with Feet Together  Able to place feet together independently and stand 1 minute safely    From Standing, Reach Forward with Outstretched Arm  Can reach forward >5 cm safely (2")    From Standing Position, Pick up Object from Floor  Able to pick up shoe safely and easily    From Standing Position, Turn to Look Behind Over each Shoulder  Looks behind from both sides and weight shifts well    Turn 360 Degrees  Able to turn 360 degrees safely one side only in 4 seconds or less    Standing Unsupported, Alternately Place Feet on Step/Stool  Able to stand independently and complete 8 steps >20 seconds    Standing Unsupported, One Foot in Front  Able to plae foot ahead of the other independently and hold 30 seconds    Standing on One Leg  Able to lift leg independently and hold equal to or more than 3 seconds    Total Score  48      Timed Up and Go Test   Normal TUG (seconds)  10.68    TUG Comments  without cane. no UEs                    [facility] Adult PT Treatment/Exercise - 10/09/18 0001      Knee/Hip Exercises: Aerobic   Nustep  seat  7 L3 10 min    while discussing progress and status      Knee/Hip Exercises: Seated   Other Seated Knee/Hip Exercises  review of HEP using resistive bands    Sit to Sand  5 reps               PT Short Term Goals - 10/09/18 1519      PT SHORT TERM GOAL #1   Title  The patient will demonstrate compliance with an initial HEP for LE strengthening    Status  Achieved      PT SHORT TERM GOAL #2   Title  The patient will have improved left knee ROM 3-120 degrees needed for improved mobility with sit to stand and other ADLs    Status  Achieved      PT SHORT TERM GOAL #3   Title  The patient will demonstrate improved gait distance to 200 feet with cane needed for short distance community ambulation     Status  Achieved      PT SHORT TERM GOAL #4   Title  BERG balance test improved to 38/56 indicating improved balance and decreased risk of falls    Status  Achieved      PT SHORT TERM GOAL #5   Title  Gait speed with Timed up and Go improved to 13 sec indicating improved gait safety    Status  Achieved        PT Long Term Goals - 10/09/18 1518      PT LONG TERM GOAL #1   Title  The patient will be independent in safe self progression of HEP needed for further improvements in strength and balance    Status  Achieved      PT LONG TERM GOAL #2   Title  The patient will be able to ambulate 300 feet with cane needed for community ambulation    Status  Achieved      PT LONG TERM GOAL #3   Title  The patient will have improved LE strength bilaterally to at least 4-/5 to 4/5 for greater ease and safety with ascending and descend stairs at her son's group home    Status  Achieved      PT LONG TERM GOAL #4   Title  Improved gait speed with 10 meter walk to 9 sec indicating improved gait safety     Status  Achieved      PT LONG TERM GOAL #5   Title  BERG balance test to 41/56 indicating improved dynamic balance and safety    Status  Achieved            Plan - 10/09/18 1526    Clinical Impression Statement  The patient has met  all short term and long term rehab goals.  Her BERG balance score has significantly improved from a 34/56 to 48/56 indicating low risk of falls.  We have discussed that she continue to use her [facility] for safety.  Her gait speed with Timed up and Go and 10 meter walk have significantly improved and are WNLs for age.  Her LE strength is grossly 4/5 to 4+/5 and she is able to rise from a chair without UE use.  She has been instructed in a HEP.  The patient is in agreement with discharge to her home program at this time.         Patient will benefit from skilled therapeutic intervention in order to improve the following deficits  and impairments:     Visit Diagnosis: Difficulty in walking, not elsewhere classified  Muscle weakness (generalized)  Unspecified lack of coordination  Acute pain of left knee    PHYSICAL THERAPY DISCHARGE SUMMARY  Visits from Start of Care: 10  Current functional level related to goals / functional outcomes: See clinical impressions above   Remaining deficits: As above   Education / Equipment: HEP Plan: Patient agrees to discharge.  Patient goals were met. Patient is being discharged due to meeting the stated rehab goals.  ?????         Problem List Patient Active Problem List   Diagnosis Date Noted  . HH (hiatus hernia): RECURRENT s/p repair 08/04/2013  . Protein calorie malnutrition ([place]) 07/29/2013  . Hemorrhoids grade 3 prolapsing 10/02/2011  . Chronic pancreatitis ([place]) 02/17/2009  . Irritable bowel syndrome 02/17/2009  . OSTEOPOROSIS 06/08/2008  . GLAUCOMA 02/25/2008  . GERD 02/25/2008  . THORACOLUMBAR SCOLIOSIS, MILD 02/25/2008   Ruben Im, PT 10/09/18 3:43 PM Phone: [phone] Fax: [phone] Alvera Singh 10/09/2018, 3:35 PM  [facility] [address] [place], [place], [address] Phone: [phone]   Fax:  [phone]  Name: JANEAL ABADI MRN:  [id number] Date of Birth: 11/14/1940

## 2018-10-20 ENCOUNTER — Encounter (HOSPITAL_COMMUNITY): Payer: Self-pay

## 2018-10-20 ENCOUNTER — Other Ambulatory Visit: Payer: Self-pay

## 2018-10-20 ENCOUNTER — Inpatient Hospital Stay (HOSPITAL_COMMUNITY)
Admission: EM | Admit: 2018-10-20 | Discharge: 2018-10-22 | DRG: 603 | Disposition: A | Discharge status: Home / Self Care | Payer: Medicare Other | Attending: Family Medicine | Admitting: Family Medicine

## 2018-10-20 DIAGNOSIS — H04129 Dry eye syndrome of unspecified lacrimal gland: Secondary | ICD-10-CM | POA: Diagnosis present

## 2018-10-20 DIAGNOSIS — Z881 Allergy status to other antibiotic agents status: Secondary | ICD-10-CM

## 2018-10-20 DIAGNOSIS — L03115 Cellulitis of right lower limb: Secondary | ICD-10-CM

## 2018-10-20 DIAGNOSIS — N3289 Other specified disorders of bladder: Secondary | ICD-10-CM | POA: Diagnosis present

## 2018-10-20 DIAGNOSIS — Z7982 Long term (current) use of aspirin: Secondary | ICD-10-CM

## 2018-10-20 DIAGNOSIS — L03116 Cellulitis of left lower limb: Principal | ICD-10-CM | POA: Diagnosis present

## 2018-10-20 DIAGNOSIS — K861 Other chronic pancreatitis: Secondary | ICD-10-CM | POA: Diagnosis not present

## 2018-10-20 DIAGNOSIS — M810 Age-related osteoporosis without current pathological fracture: Secondary | ICD-10-CM | POA: Diagnosis present

## 2018-10-20 DIAGNOSIS — L0390 Cellulitis, unspecified: Secondary | ICD-10-CM | POA: Diagnosis present

## 2018-10-20 DIAGNOSIS — Z8249 Family history of ischemic heart disease and other diseases of the circulatory system: Secondary | ICD-10-CM

## 2018-10-20 DIAGNOSIS — I10 Essential (primary) hypertension: Secondary | ICD-10-CM | POA: Diagnosis not present

## 2018-10-20 DIAGNOSIS — Z823 Family history of stroke: Secondary | ICD-10-CM

## 2018-10-20 DIAGNOSIS — R682 Dry mouth, unspecified: Secondary | ICD-10-CM | POA: Diagnosis present

## 2018-10-20 DIAGNOSIS — Z87891 Personal history of nicotine dependence: Secondary | ICD-10-CM

## 2018-10-20 DIAGNOSIS — Z800 Family history of malignant neoplasm of digestive organs: Secondary | ICD-10-CM

## 2018-10-20 DIAGNOSIS — E785 Hyperlipidemia, unspecified: Secondary | ICD-10-CM | POA: Diagnosis present

## 2018-10-20 DIAGNOSIS — K8681 Exocrine pancreatic insufficiency: Secondary | ICD-10-CM | POA: Diagnosis present

## 2018-10-20 DIAGNOSIS — H409 Unspecified glaucoma: Secondary | ICD-10-CM | POA: Diagnosis present

## 2018-10-20 DIAGNOSIS — Z801 Family history of malignant neoplasm of trachea, bronchus and lung: Secondary | ICD-10-CM

## 2018-10-20 DIAGNOSIS — K589 Irritable bowel syndrome without diarrhea: Secondary | ICD-10-CM | POA: Diagnosis present

## 2018-10-20 DIAGNOSIS — K219 Gastro-esophageal reflux disease without esophagitis: Secondary | ICD-10-CM | POA: Diagnosis present

## 2018-10-20 DIAGNOSIS — Z841 Family history of disorders of kidney and ureter: Secondary | ICD-10-CM

## 2018-10-20 DIAGNOSIS — Z808 Family history of malignant neoplasm of other organs or systems: Secondary | ICD-10-CM

## 2018-10-20 DIAGNOSIS — N3281 Overactive bladder: Secondary | ICD-10-CM | POA: Diagnosis present

## 2018-10-20 DIAGNOSIS — Z79899 Other long term (current) drug therapy: Secondary | ICD-10-CM

## 2018-10-20 LAB — COMPREHENSIVE METABOLIC PANEL
ALT: 18 U/L (ref 0–44)
AST: 24 U/L (ref 15–41)
Albumin: 4.1 g/dL (ref 3.5–5.0)
Alkaline Phosphatase: 78 U/L (ref 38–126)
Anion gap: 8 (ref 5–15)
BUN: 19 mg/dL (ref 8–23)
CHLORIDE: 102 mmol/L (ref 98–111)
CO2: 30 mmol/L (ref 22–32)
Calcium: 9 mg/dL (ref 8.9–10.3)
Creatinine, Ser: 0.5 mg/dL (ref 0.44–1.00)
GFR calc non Af Amer: >60 mL/min (ref >60)
GLUCOSE: 101 mg/dL — AB (ref 70–99)
POTASSIUM: 3.8 mmol/L (ref 3.5–5.1)
SODIUM: 140 mmol/L (ref 135–145)
TOTAL PROTEIN: 7.1 g/dL (ref 6.5–8.1)
Total Bilirubin: 0.6 mg/dL (ref 0.3–1.2)

## 2018-10-20 LAB — CBC WITH DIFFERENTIAL/PLATELET
Abs Immature Granulocytes: 0.03 10*3/uL (ref 0.00–0.07)
BASOS PCT: 0 %
Basophils Absolute: 0 10*3/uL (ref 0.0–0.1)
Eosinophils Absolute: 0.7 10*3/uL — ABNORMAL HIGH (ref 0.0–0.5)
Eosinophils Relative: 8 %
HCT: 42.1 % (ref 36.0–46.0)
Hemoglobin: 13.2 g/dL (ref 12.0–15.0)
Immature Granulocytes: 0 %
Lymphocytes Relative: 11 %
Lymphs Abs: 0.9 10*3/uL (ref 0.7–4.0)
MCH: 29.1 pg (ref 26.0–34.0)
MCHC: 31.4 g/dL (ref 30.0–36.0)
MCV: 92.7 fL (ref 80.0–100.0)
MONO ABS: 0.9 10*3/uL (ref 0.1–1.0)
Monocytes Relative: 11 %
NEUTROS ABS: 5.9 10*3/uL (ref 1.7–7.7)
Neutrophils Relative %: 70 %
PLATELETS: 247 10*3/uL (ref 150–400)
RBC: 4.54 MIL/uL (ref 3.87–5.11)
RDW: 11.8 % (ref 11.5–15.5)
WBC: 8.5 10*3/uL (ref 4.0–10.5)
nRBC: 0 % (ref 0.0–0.2)

## 2018-10-20 MED ORDER — SODIUM CHLORIDE 0.9 % IV SOLN
INTRAVENOUS | Status: AC
  Administered 2018-10-21: 01:00:00 via INTRAVENOUS

## 2018-10-20 MED ORDER — ASPIRIN EC 81 MG PO TBEC
81.0000 mg | DELAYED_RELEASE_TABLET | Freq: Every day | ORAL | Status: DC
  Administered 2018-10-21 – 2018-10-22 (×2): 81 mg via ORAL
  Filled 2018-10-20 (×2): qty 1

## 2018-10-20 MED ORDER — AMLODIPINE BESYLATE 5 MG PO TABS
5.0000 mg | ORAL_TABLET | Freq: Every day | ORAL | Status: DC
  Administered 2018-10-21 – 2018-10-22 (×2): 5 mg via ORAL
  Filled 2018-10-20 (×2): qty 1

## 2018-10-20 MED ORDER — TIMOLOL MALEATE 0.5 % OP SOLN
1.0000 [drp] | Freq: Two times a day (BID) | OPHTHALMIC | Status: DC
  Administered 2018-10-21 (×3): 1 [drp] via OPHTHALMIC
  Filled 2018-10-20: qty 5

## 2018-10-20 MED ORDER — LATANOPROST 0.005 % OP SOLN
1.0000 [drp] | Freq: Every day | OPHTHALMIC | Status: DC
  Administered 2018-10-21 (×2): 1 [drp] via OPHTHALMIC
  Filled 2018-10-20: qty 2.5

## 2018-10-20 MED ORDER — ACETAMINOPHEN 325 MG PO TABS
650.0000 mg | ORAL_TABLET | Freq: Four times a day (QID) | ORAL | Status: DC | PRN
  Administered 2018-10-21: 650 mg via ORAL
  Filled 2018-10-20: qty 2

## 2018-10-20 MED ORDER — CETAPHIL MOISTURIZING EX LOTN
1.0000 "application " | TOPICAL_LOTION | Freq: Two times a day (BID) | CUTANEOUS | Status: DC
  Administered 2018-10-21 – 2018-10-22 (×4): 1 via TOPICAL
  Filled 2018-10-20: qty 473

## 2018-10-20 MED ORDER — PANCRELIPASE (LIP-PROT-AMYL) 12000-38000 UNITS PO CPEP
24000.0000 [IU] | ORAL_CAPSULE | Freq: Three times a day (TID) | ORAL | Status: DC
  Administered 2018-10-21 – 2018-10-22 (×4): 24000 [IU] via ORAL
  Filled 2018-10-20 (×4): qty 2

## 2018-10-20 MED ORDER — PRAVASTATIN SODIUM 20 MG PO TABS
20.0000 mg | ORAL_TABLET | Freq: Every day | ORAL | Status: DC
  Administered 2018-10-21: 20 mg via ORAL
  Filled 2018-10-20: qty 1

## 2018-10-20 MED ORDER — PANTOPRAZOLE SODIUM 40 MG PO TBEC
40.0000 mg | DELAYED_RELEASE_TABLET | Freq: Every day | ORAL | Status: DC
  Administered 2018-10-21 – 2018-10-22 (×2): 40 mg via ORAL
  Filled 2018-10-20 (×2): qty 1

## 2018-10-20 MED ORDER — POLYETHYLENE GLYCOL 3350 17 G PO PACK: 17.0000 g | PACK | Freq: Every day | ORAL | Status: DC | PRN

## 2018-10-20 MED ORDER — VANCOMYCIN HCL IN DEXTROSE 1-5 GM/200ML-% IV SOLN
1000.0000 mg | Freq: Once | INTRAVENOUS | Status: AC
  Administered 2018-10-20: 1000 mg via INTRAVENOUS
  Filled 2018-10-20: qty 200

## 2018-10-20 MED ORDER — ONDANSETRON HCL 4 MG PO TABS
4.0000 mg | ORAL_TABLET | Freq: Four times a day (QID) | ORAL | Status: DC | PRN
  Administered 2018-10-22: 4 mg via ORAL
  Filled 2018-10-20: qty 1

## 2018-10-20 MED ORDER — BENAZEPRIL HCL 20 MG PO TABS
20.0000 mg | ORAL_TABLET | Freq: Every day | ORAL | Status: DC
  Administered 2018-10-21 – 2018-10-22 (×2): 20 mg via ORAL
  Filled 2018-10-20 (×2): qty 1

## 2018-10-20 MED ORDER — ENOXAPARIN SODIUM 40 MG/0.4ML ~~LOC~~ SOLN
40.0000 mg | Freq: Every day | SUBCUTANEOUS | Status: DC
  Administered 2018-10-21: 40 mg via SUBCUTANEOUS
  Filled 2018-10-20: qty 0.4

## 2018-10-20 MED ORDER — ACETAMINOPHEN 650 MG RE SUPP: 650.0000 mg | Freq: Four times a day (QID) | RECTAL | Status: DC | PRN

## 2018-10-20 MED ORDER — HYDROCODONE-ACETAMINOPHEN 5-325 MG PO TABS
1.0000 | ORAL_TABLET | ORAL | Status: DC | PRN
  Administered 2018-10-22: 2 via ORAL
  Filled 2018-10-20: qty 2

## 2018-10-20 MED ORDER — ONDANSETRON HCL 4 MG/2ML IJ SOLN: 4.0000 mg | Freq: Four times a day (QID) | INTRAMUSCULAR | Status: DC | PRN

## 2018-10-20 MED ORDER — POLYVINYL ALCOHOL 1.4 % OP SOLN
1.0000 [drp] | Freq: Four times a day (QID) | OPHTHALMIC | Status: DC
  Administered 2018-10-21 (×3): 1 [drp] via OPHTHALMIC
  Filled 2018-10-20: qty 15

## 2018-10-20 NOTE — ED Notes (Signed)
ED TO INPATIENT HANDOFF REPORT  Name/Age/Gender [name] 78 y.o. female  Code Status Code Status History    Date Active Date Inactive Code Status Order ID Comments User Context   07/28/2013 1301 08/15/2013 1429 Full Code [id number]  Catarina Hartshorn, MD Inpatient      Home/SNF/Other Home  Chief Complaint Left leg pain   Level of Care/Admitting Diagnosis ED Disposition    ED Disposition Condition Comment   Admit  Hospital Area: [facility] [[id number]]  Level of Care: Med-Surg [16]  Diagnosis: Left leg cellulitis [379432]  Admitting Physician: Briscoe Deutscher [[id number]]  Attending Physician: Briscoe Deutscher [[id number]]  PT Class (Do Not Modify): Observation [104]  PT Acc Code (Do Not Modify): Observation [10022]       Medical History Past Medical History:  Diagnosis Date  . Abscessed tooth    STATES TOOTH PULLED RIGHT LOWER MOLAR   AND LEFT LOWER MOLAR ON3/25/13 AND PT ON ANTIBIOTIC  . Acute respiratory failure with hypoxia (HCC) 07/28/2013  . Bladder spasms   . Blood transfusion    HEMORRHAGED  1 WEEK AFTER COLONOSCOPY  -REQUIRED TRANSFUSION  . Chronic pancreatitis (HCC)   . Diverticulosis   . Dry eye syndrome   . Dry mouth   . 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 GERD (gastroesophageal reflux disease)   . GI bleed   . Glaucoma   . Glaucoma   . Hiatal hernia    LARGE HIATAL HERNIA PER CXR REPORT  . Hypertension   . IBS (irritable bowel syndrome)   . Internal hemorrhoids   . Osteoporosis   . Overactive bladder   . Pancreatitis    past history   . Pelvic floor dysfunction    and rectalprolaspe  . Thoracogenic scoliosis of thoracolumbar region    SEVERE  . Volvulus (HCC) 08/04/2013    Allergies Allergies  Allergen Reactions  . Codeine     REACTION: Dlizzy  . Doxycycline Itching    IV Location/Drains/Wounds Patient Lines/Drains/Airways Status    Active Line/Drains/Airways    Name:   Placement date:   Placement time:   Site:   Days:   Gastrostomy/Enterostomy Gastrostomy 24 Fr. LUQ   07/30/13    1422    LUQ   1908   Incision 07/30/13 Abdomen Other (Comment)   07/30/13    1503     1908          Labs/Imaging Results for orders placed or performed during the hospital encounter of 10/20/18 (from the past 48 hour(s))  CBC with Differential     Status: Abnormal   Collection Time: 10/20/18  8:11 PM  Result Value Ref Range   WBC 8.5 4.0 - 10.5 K/uL   RBC 4.54 3.87 - 5.11 MIL/uL   Hemoglobin 13.2 12.0 - 15.0 g/dL   HCT 40.3 70.9 - 64.3 %   MCV 92.7 80.0 - 100.0 fL   MCH 29.1 26.0 - 34.0 pg   MCHC 31.4 30.0 - 36.0 g/dL   RDW 83.8 18.4 - 03.7 %   Platelets 247 150 - 400 K/uL   nRBC 0.0 0.0 - 0.2 %   Neutrophils Relative % 70 %   Neutro Abs 5.9 1.7 - 7.7 K/uL   Lymphocytes Relative 11 %   Lymphs Abs 0.9 0.7 - 4.0 K/uL   Monocytes Relative 11 %   Monocytes Absolute 0.9 0.1 -  1.0 K/uL   Eosinophils Relative 8 %   Eosinophils Absolute 0.7 (H) 0.0 - 0.5 K/uL   Basophils Relative 0 %   Basophils Absolute 0.0 0.0 - 0.1 K/uL   Immature Granulocytes 0 %   Abs Immature Granulocytes 0.03 0.00 - 0.07 K/uL    Comment: Performed at [facility], [address] [place], [place] [address]  Comprehensive metabolic panel     Status: Abnormal   Collection Time: 10/20/18  8:11 PM  Result Value Ref Range   Sodium 140 135 - 145 mmol/L   Potassium 3.8 3.5 - 5.1 mmol/L   Chloride 102 98 - 111 mmol/L   CO2 30 22 - 32 mmol/L   Glucose, Bld 101 (H) 70 - 99 mg/dL   BUN 19 8 - 23 mg/dL   Creatinine, Ser 4.01 0.44 - 1.00 mg/dL   Calcium 9.0 8.9 - 02.7 mg/dL   Total Protein 7.1 6.5 - 8.1 g/dL   Albumin 4.1 3.5 - 5.0 g/dL   AST 24 15 - 41 U/L   ALT 18 0 - 44 U/L   Alkaline Phosphatase 78 38 - 126 U/L   Total Bilirubin 0.6 0.3 - 1.2 mg/dL   GFR calc non Af Amer >60 >60 mL/min   GFR calc Af Amer >60 >60 mL/min   Anion gap 8 5  - 15    Comment: Performed at [facility], [address] [place], [place] [address]   No results found. None  Pending Labs Unresulted Labs (From admission, onward)    Start     Ordered   10/20/18 2156  Culture, blood (routine x 2)  BLOOD CULTURE X 2,   STAT     10/20/18 2156          Vitals/Pain Today's Vitals   10/20/18 1814 10/20/18 1816  BP:  126/86  Pulse:  (!) 109  Resp:  16  Temp:  98 F (36.7 C)  TempSrc:  Oral  SpO2:  98%  Weight:  50.3 kg  Height:  4\' 6"  (1.372 m)  PainSc: 9      Isolation Precautions No active isolations  Medications Medications  vancomycin (VANCOCIN) IVPB 1000 mg/200 mL premix (has no administration in time range)    Mobility walks

## 2018-10-20 NOTE — ED Provider Notes (Signed)
[facility] Provider Note   CSN: [id number] Arrival date & time: 10/20/18  1807     History   Chief Complaint Chief Complaint  Patient presents with  . Cellulitis    HPI [name] is a 78 y.o. female with history as below presenting to emergency department with chief complaint of cellulitis of left leg x2 weeks.  Patient reports when she first noticed the redness and swelling in her leg it was located only at her ankle.  She went to see her PCP who started her on doxycycline.  After taking 2 days of doxycycline patient had itching and her PCP switched her to Keflex.  Patient thinks she has taken 3 days of Keflex, but her infection seems to be worsening.  The redness has spread up to her thigh.  Patient reports she had a DVT study when this first started and it was negative.  Patient denies any associated pain. Denies fever, chills, decreased sensation in her legs, chest pain, nausea, abdominal pain, vomiting, drainage from the leg.  Past Medical History:  Diagnosis Date  . Abscessed tooth    STATES TOOTH PULLED RIGHT LOWER MOLAR   AND LEFT LOWER MOLAR ON3/25/13 AND PT ON ANTIBIOTIC  . Acute respiratory failure with hypoxia (HCC) 07/28/2013  . Bladder spasms   . Blood transfusion    HEMORRHAGED  1 WEEK AFTER COLONOSCOPY  -REQUIRED TRANSFUSION  . Chronic pancreatitis (HCC)   . Diverticulosis   . Dry eye syndrome   . Dry mouth   . 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 GERD (gastroesophageal reflux disease)   . GI bleed   . Glaucoma   . Glaucoma   . Hiatal hernia    LARGE HIATAL HERNIA PER CXR REPORT  . Hypertension   . IBS (irritable bowel syndrome)   . Internal hemorrhoids   . Osteoporosis   . Overactive bladder   . Pancreatitis    past history   . Pelvic floor dysfunction    and rectalprolaspe  . Thoracogenic scoliosis of thoracolumbar  region    SEVERE  . 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 Left leg cellulitis 10/20/2018  . Hypertension 10/20/2018  . HH (hiatus hernia): RECURRENT s/p repair 08/04/2013  . Protein calorie malnutrition (HCC) 07/29/2013  . Hemorrhoids grade 3 prolapsing 10/02/2011  . Chronic pancreatitis (HCC) 02/17/2009  . Irritable bowel syndrome 02/17/2009  . OSTEOPOROSIS 06/08/2008  . GLAUCOMA 02/25/2008  . GERD 02/25/2008  . THORACOLUMBAR SCOLIOSIS, MILD 02/25/2008    Past Surgical History:  Procedure Laterality Date  . BAND HEMORRHOIDECTOMY    . BLADDER SUSPENSION  2002   Grapey - w/ hysty  . CATARACT EXTRACTION     bilateral  . COLONOSCOPY  02/03/2008   diverticulosis, internal hemorrhoids  . ESOPHAGOGASTRODUODENOSCOPY N/A 07/28/2013   Procedure: ESOPHAGOGASTRODUODENOSCOPY (EGD);  Surgeon: Rachael Fee, MD;  Location: [facility];  Service: Endoscopy;  Laterality: N/A;  . ESOPHAGOGASTRODUODENOSCOPY N/A 07/28/2013   Procedure: ESOPHAGOGASTRODUODENOSCOPY (EGD);  Surgeon: Rachael Fee, MD;  Location: [facility];  Service: Endoscopy;  Laterality: N/A;  . EYE SURGERY     LASER OF BOTH EYES  FOR ELEVATED PRESSURE  . FLEXIBLE SIGMOIDOSCOPY  11/11/2008   internal hemorrhoids  . GASTROSTOMY TUBE PLACEMENT  07/30/13  . HIATAL HERNIA REPAIR    . HIATAL HERNIA REPAIR  N/A 07/30/2013   Procedure: Exploratory Laparotomy, Lysis of Adhesions, Reduction of Hiatal Hernia, Repair of Esophogeal Perforations, Dor Fundal Plication, Evacuation of Gastric Bezoar, Gastrostomy Tube Insertion, Upper Endoscopy by Dr. Corliss Skainssuei;  Surgeon: Adolph Pollackodd J Rosenbower, MD;  Location: [facility];  Service: General;  Laterality: N/A;  . MOUTH SURGERY    . MOUTH SURGERY    . NISSEN FUNDOPLICATION    . STAPLE HEMORRHOIDECTOMY    . UPPER GASTROINTESTINAL ENDOSCOPY  02/12/2008   hiatal henia  . VAGINAL HYSTERECTOMY  2002   A&P repair/bladder sling     OB History   No obstetric  history on file.      Home Medications    Prior to Admission medications   Medication Sig Start Date End Date Taking? Authorizing Provider  acetaminophen (TYLENOL) 500 MG tablet Take 500-1,000 mg by mouth every 6 (six) hours as needed for moderate pain or headache.   Yes [provider]  alosetron (LOTRONEX) 1 MG tablet TAKE 1 TABLET BY MOUTH TWICE A DAY Patient taking differently: Take 1 mg by mouth 2 (two) times daily.  08/13/18  Yes Iva BoopGessner, Carl E, MD  amLODipine-benazepril (LOTREL) 5-20 MG per capsule Take 1 capsule by mouth daily after breakfast. Patient took at home prior to arrival to Short Stay   Yes [provider]  aspirin 81 MG tablet Take 81 mg by mouth daily after breakfast.    Yes [provider]  carboxymethylcellulose (REFRESH PLUS) 0.5 % SOLN Place 1 drop into both eyes 4 (four) times daily.    Yes [provider]  cholecalciferol (VITAMIN D) 400 UNITS TABS tablet Take 400 Units by mouth daily.   Yes [provider]  CREON 24000-76000 units CPEP TAKE 3 CAPSULES BY MOUTH 3 TIMES A DAY WITH MEALS Patient taking differently: Take 3 capsules by mouth See admin instructions. Take 3 capsules by mouth with each meal and 1 capsule with each snack 08/21/17  Yes Iva BoopGessner, Carl E, MD  denosumab (PROLIA) 60 MG/ML SOLN injection Inject 60 mg into the skin every 6 (six) months. Administer in upper arm, thigh, or abdomen   Yes [provider]  loperamide (IMODIUM) 2 MG capsule Take 2 mg by mouth every morning.  08/14/13  Yes Sherrie GeorgeJennings, Willard, PA-C  Multiple Vitamins-Minerals (CENTRUM ULTRA WOMENS) TABS Take 1 tablet by mouth daily.   Yes [provider]  omeprazole (PRILOSEC) 20 MG capsule TAKE 1 CAPSULE BY MOUTH EVERY DAY Patient taking differently: Take 20 mg by mouth daily.  09/01/18  Yes Iva BoopGessner, Carl E, MD  pravastatin (PRAVACHOL) 20 MG tablet Take 20 mg by mouth daily.   Yes [provider]  Tafluprost (ZIOPTAN)  0.0015 % SOLN Apply 1 drop to eye at bedtime.    Yes [provider]  timolol (BETIMOL) 0.5 % ophthalmic solution Place 1 drop into both eyes 3 (three) times daily.    Yes [provider]  Wheat Dextrin (BENEFIBER) TABS Take 2 tablets by mouth 2 (two) times daily.   Yes [provider]  CREON 24000-76000 units CPEP TAKE 3 CAPSULES (72,000 UNITS TOTAL) BY MOUTH 3 (THREE) TIMES DAILY WITH MEALS. 1 W/ SNACK Patient not taking: Reported on 10/20/2018 08/04/18   Iva BoopGessner, Carl E, MD    Family History Family History  Problem Relation Age of Onset  . Stroke Father   . Kidney disease Mother   . Heart disease Sister   . Liver cancer Sister   . Liver cancer Sister   .  Brain cancer Sister   . Lung cancer Brother   . Heart disease Brother        Atrial fibrillation  . Colon cancer Neg Hx     Social History Social History   Tobacco Use  . Smoking status: Former Smoker    Types: Cigarettes  . Smokeless tobacco: Never Used  . Tobacco comment: ONLY SMOKED AS A TEEN  Substance Use Topics  . Alcohol use: No  . Drug use: No     Allergies   Codeine and Doxycycline   Review of Systems Review of Systems  Constitutional: Negative for chills and fever.  HENT: Negative for congestion, sinus pressure and sore throat.   Eyes: Negative for pain and visual disturbance.  Respiratory: Negative for chest tightness and shortness of breath.   Cardiovascular: Negative for chest pain and palpitations.  Gastrointestinal: Negative for abdominal pain, diarrhea, nausea and vomiting.  Genitourinary: Negative for difficulty urinating and hematuria.  Musculoskeletal: Negative for back pain and neck pain.  Skin: Positive for color change and wound. Negative for rash.  Neurological: Negative for syncope and headaches.     Physical Exam Updated Vital Signs BP 126/86 (BP Location: Right Arm)   Pulse (!) 109   Temp 98 F (36.7 C) (Oral)   Resp 16   Ht 4\' 6"  (1.372 m)   Wt 50.3  kg   SpO2 98%   BMI 26.76 kg/m   Physical Exam Vitals signs and nursing note reviewed.  Constitutional:      Appearance: She is not ill-appearing or toxic-appearing.  HENT:     Head: Normocephalic and atraumatic.     Nose: Nose normal.     Mouth/Throat:     Mouth: Mucous membranes are moist.     Pharynx: Oropharynx is clear.  Eyes:     Conjunctiva/sclera: Conjunctivae normal.  Neck:     Musculoskeletal: Normal range of motion.  Cardiovascular:     Rate and Rhythm: Normal rate and regular rhythm.     Pulses: Normal pulses.     Heart sounds: Normal heart sounds.  Pulmonary:     Effort: Pulmonary effort is normal.     Breath sounds: Normal breath sounds.  Abdominal:     General: There is no distension.     Palpations: Abdomen is soft.     Tenderness: There is no abdominal tenderness. There is no guarding or rebound.  Musculoskeletal: Normal range of motion.  Skin:    General: Skin is warm and dry.     Capillary Refill: Capillary refill takes less than 2 seconds.     Findings: Erythema present.     Comments: Left leg with erythema from ankle up to her thigh.  The leg is edematous and warm with skin peeling, as pictured   Neurological:     Mental Status: She is alert. Mental status is at baseline.     Motor: No weakness.  Psychiatric:        Behavior: Behavior normal.            ED Treatments / Results  Labs (all labs ordered are listed, but only abnormal results are displayed) Labs Reviewed  CBC WITH DIFFERENTIAL/PLATELET - Abnormal; Notable for the following components:      Result Value   Eosinophils Absolute 0.7 (*)    All other components within normal limits  COMPREHENSIVE METABOLIC PANEL - Abnormal; Notable for the following components:   Glucose, Bld 101 (*)    All other components within normal  limits  CULTURE, BLOOD (ROUTINE X 2)  CULTURE, BLOOD (ROUTINE X 2)  BASIC METABOLIC PANEL  CBC WITH DIFFERENTIAL/PLATELET    EKG None  Radiology No  results found.  Procedures Procedures (including critical care time)  Medications Ordered in ED Medications  vancomycin (VANCOCIN) IVPB 1000 mg/200 mL premix (has no administration in time range)  lipase/protease/amylase (CREON) capsule 24,000 Units (has no administration in time range)  pantoprazole (PROTONIX) EC tablet 40 mg (has no administration in time range)  carboxymethylcellulose (REFRESH PLUS) 0.5 % ophthalmic solution 1 drop (has no administration in time range)  latanoprost (XALATAN) 0.005 % ophthalmic solution 1 drop (has no administration in time range)  timolol (BETIMOL) 0.5 % ophthalmic solution 1 drop (has no administration in time range)  pravastatin (PRAVACHOL) tablet 20 mg (has no administration in time range)  amLODipine (NORVASC) tablet 5 mg (has no administration in time range)  benazepril (LOTENSIN) tablet 20 mg (has no administration in time range)  aspirin EC tablet 81 mg (has no administration in time range)  enoxaparin (LOVENOX) injection 40 mg (has no administration in time range)  0.9 %  sodium chloride infusion (has no administration in time range)  acetaminophen (TYLENOL) tablet 650 mg (has no administration in time range)    Or  acetaminophen (TYLENOL) suppository 650 mg (has no administration in time range)  HYDROcodone-acetaminophen (NORCO/VICODIN) 5-325 MG per tablet 1-2 tablet (has no administration in time range)  polyethylene glycol (MIRALAX / GLYCOLAX) packet 17 g (has no administration in time range)  ondansetron (ZOFRAN) tablet 4 mg (has no administration in time range)    Or  ondansetron (ZOFRAN) injection 4 mg (has no administration in time range)  cetaphil lotion (has no administration in time range)     Initial Impression / Assessment and Plan / ED Course  I have reviewed the triage vital signs and the nursing notes.  Pertinent labs & imaging results that were available during my care of the patient were reviewed by me and considered in  my medical decision making (see chart for details).   Patient is afebrile, nontoxic-appearing.  She is tachycardic in triage at 109.  Her CBC and CMP are unremarkable.  Although patient is well-appearing, she has a worsening cellulitis infection that started on her ankle and is now spread to her thigh with lymphangitis.  Patient has failed outpatient treatment and requires IV antibiotics and observation.  We will start her on IV vancomycin, draw blood cultures, and admit to the hospitalist.  Pt case discussed with Dr. Criss Alvine who agrees with my plan.     Final Clinical Impressions(s) / ED Diagnoses   Final diagnoses:  Cellulitis of right leg    ED Discharge Orders    None       Sherene Sires, PA-C 10/20/18 2255    Pricilla Loveless, MD 10/20/18 [phone]

## 2018-10-20 NOTE — ED Triage Notes (Signed)
Pt sent over by [facility]. Pt has had cellulitis in her left leg (up to thigh) which she has been taken abx for without relief.

## 2018-10-20 NOTE — H&P (Signed)
History and Physical    Angela FredericksonJean F [name] ZOX:[id number] DOB: 04/09/1941 DOA: 10/20/2018  PCP: Angela PollackGates, Donna, MD (Inactive)   Patient coming from: Home   Chief Complaint: Left leg redness and pain   HPI: [name] is a 78 y.o. female with medical history significant for hypertension, glaucoma, and chronic pancreatitis, now presenting to emergency department for evaluation of redness and pain involving her lower left leg.  Patient reports that she developed some redness near her left ankle approximately 2 weeks ago, saw her PCP and was started on doxycycline, experienced some itching with doxycycline and was changed to Keflex.  Despite the antibiotics, the erythema has progressed proximally up past the knee.  She had venous ultrasound that was negative for DVT early in the course.  She has not noted any fevers or chills, denies any other rashes or wounds, and denies any drainage from the leg.  ED Course: Upon arrival to the ED, patient is found to be afebrile, saturating well on room air, slightly tachycardic, and with stable blood pressure.  Chemistry panel and CBC are unremarkable.  Blood cultures were ordered, not yet collected, and vancomycin was ordered.  Patient remained stable and will be observed for further evaluation and management of left lower leg cellulitis that continues to progress despite outpatient antibiotics.  Review of Systems:  All other systems reviewed and apart from HPI, are negative.  Past Medical History:  Diagnosis Date  . Abscessed tooth    STATES TOOTH PULLED RIGHT LOWER MOLAR   AND LEFT LOWER MOLAR ON3/25/13 AND PT ON ANTIBIOTIC  . Acute respiratory failure with hypoxia (HCC) 07/28/2013  . Bladder spasms   . Blood transfusion    HEMORRHAGED  1 WEEK AFTER COLONOSCOPY  -REQUIRED TRANSFUSION  . Chronic pancreatitis (HCC)   . Diverticulosis   . Dry eye syndrome   . Dry mouth   . 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 GERD (gastroesophageal reflux disease)   . GI bleed   . Glaucoma   . Glaucoma   . Hiatal hernia    LARGE HIATAL HERNIA PER CXR REPORT  . Hypertension   . IBS (irritable bowel syndrome)   . Internal hemorrhoids   . Osteoporosis   . Overactive bladder   . Pancreatitis    past history   . Pelvic floor dysfunction    and rectalprolaspe  . Thoracogenic scoliosis of thoracolumbar region    SEVERE  . 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 BAND HEMORRHOIDECTOMY    . BLADDER SUSPENSION  2002   Grapey - w/ hysty  . CATARACT EXTRACTION     bilateral  . COLONOSCOPY  02/03/2008   diverticulosis, internal hemorrhoids  . ESOPHAGOGASTRODUODENOSCOPY N/A 07/28/2013   Procedure: ESOPHAGOGASTRODUODENOSCOPY (EGD);  Surgeon: Angela Feeaniel P Jacobs, MD;  [facility];  Service: Endoscopy;  Laterality: N/A;  . ESOPHAGOGASTRODUODENOSCOPY N/A 07/28/2013   Procedure: ESOPHAGOGASTRODUODENOSCOPY (EGD);  Surgeon: Angela Feeaniel P Jacobs, MD;  [facility];  Service: Endoscopy;  Laterality: N/A;  . EYE SURGERY     LASER OF BOTH EYES  FOR ELEVATED PRESSURE  . FLEXIBLE SIGMOIDOSCOPY  11/11/2008   internal hemorrhoids  . GASTROSTOMY TUBE PLACEMENT  07/30/13  . HIATAL HERNIA REPAIR    . HIATAL HERNIA REPAIR N/A 07/30/2013   Procedure: Exploratory Laparotomy, Lysis of Adhesions, Reduction of Hiatal Hernia, Repair of Esophogeal  Perforations, Dor Fundal Plication, Evacuation of Gastric Bezoar, Gastrostomy Tube Insertion, Upper Endoscopy by Dr. Corliss [name];  Surgeon: Angela Pollack, MD;  Location: [facility];  Service: General;  Laterality: N/A;  . MOUTH SURGERY    . MOUTH SURGERY    . NISSEN FUNDOPLICATION    . STAPLE HEMORRHOIDECTOMY    . UPPER GASTROINTESTINAL ENDOSCOPY  02/12/2008   hiatal henia  . VAGINAL HYSTERECTOMY  2002   A&P repair/bladder sling     reports that she has quit smoking. Her smoking use  included cigarettes. She has never used smokeless tobacco. She reports that she does not drink alcohol or use drugs.  Allergies  Allergen Reactions  . Codeine     REACTION: Dlizzy  . Doxycycline Itching    Family History  Problem Relation Age of Onset  . Stroke Father   . Kidney disease Mother   . Heart disease Sister   . Liver cancer Sister   . Liver cancer Sister   . Brain cancer Sister   . Lung cancer Brother   . Heart disease Brother        Atrial fibrillation  . Colon cancer Neg Hx      Prior to Admission medications   Medication Sig Start Date End Date Taking? Authorizing Provider  CREON 24000-76000 units CPEP TAKE 3 CAPSULES BY MOUTH 3 TIMES A DAY WITH MEALS 08/21/17  Yes Angela Boop, MD  omeprazole (PRILOSEC) 20 MG capsule TAKE 1 CAPSULE BY MOUTH EVERY DAY 09/01/18  Yes Angela Boop, MD  acetaminophen (TYLENOL) 500 MG tablet Take 500-1,000 mg by mouth every 6 (six) hours as needed for moderate pain or headache.    [provider]  alosetron (LOTRONEX) 1 MG tablet TAKE 1 TABLET BY MOUTH TWICE A DAY 08/13/18   Angela Boop, MD  amLODipine-benazepril (LOTREL) 5-20 MG per capsule Take 1 capsule by mouth daily after breakfast. Patient took at home prior to arrival to Short Stay    [provider]  aspirin 81 MG tablet Take 81 mg by mouth daily after breakfast.     [provider]  carboxymethylcellulose (REFRESH PLUS) 0.5 % SOLN Place 1 drop into both eyes 4 (four) times daily.     [provider]  cholecalciferol (VITAMIN D) 400 UNITS TABS tablet Take 400 Units by mouth daily.    [provider]  CREON 24000-76000 units CPEP TAKE 3 CAPSULES (72,000 UNITS TOTAL) BY MOUTH 3 (THREE) TIMES DAILY WITH MEALS. 1 W/ SNACK 08/04/18   Angela Boop, MD  denosumab (PROLIA) 60 MG/ML SOLN injection Inject 60 mg into the skin every 6 (six) months. Administer in upper arm, thigh, or abdomen    [provider]  loperamide  (IMODIUM) 2 MG capsule Take 2 mg by mouth every morning.  08/14/13   Angela George, PA-C  Multiple Vitamins-Minerals (CENTRUM ULTRA WOMENS) TABS Take 1 tablet by mouth daily.    [provider]  pravastatin (PRAVACHOL) 20 MG tablet Take 20 mg by mouth daily.    [provider]  Tafluprost (ZIOPTAN) 0.0015 % SOLN Apply 1 drop to eye at bedtime.     [provider]  timolol (BETIMOL) 0.5 % ophthalmic solution Place 1 drop into both eyes 2 (two) times daily.    [provider]  Vitamin D, Ergocalciferol, (DRISDOL) 50000 UNITS CAPS Take 50,000 Units by mouth every 14 (fourteen) days.     [provider]  Wheat Dextrin (BENEFIBER) TABS Take 2 tablets  by mouth 2 (two) times daily.    [provider]    Physical Exam: Vitals:   10/20/18 1816  BP: 126/86  Pulse: (!) 109  Resp: 16  Temp: 98 F (36.7 C)  TempSrc: Oral  SpO2: 98%  Weight: 50.3 kg  Height: 4\' 6"  (1.372 m)    Constitutional: NAD, calm, comfortable Eyes: PERTLA, lids and conjunctivae normal ENMT: Mucous membranes are moist. Posterior pharynx clear of any exudate or lesions.   Neck: normal, supple, no masses, no thyromegaly Respiratory: clear to auscultation bilaterally, no wheezing, no crackles. Normal respiratory effort.    Cardiovascular: Rate ~110 and regular. No significant JVD. Abdomen: No distension, no tenderness, soft. Bowel sounds active.  Musculoskeletal: no clubbing / cyanosis. No joint deformity upper and lower extremities.    Skin: Erythema, edema, heat, and tenderness involving lower left leg without fluctuance or drainage. Xerosis. Dishydrosis b/l hands. Warm, dry, well-perfused. Neurologic: CN 2-12 grossly intact. Sensation intact. Strength 5/5 in all 4 limbs.  Psychiatric: Alert and oriented x 3. Calm, cooperative.    Labs on Admission: I have personally reviewed following labs and imaging studies  CBC: Recent Labs  Lab 10/20/18 2011  WBC 8.5    NEUTROABS 5.9  HGB 13.2  HCT 42.1  MCV 92.7  PLT 247   Basic Metabolic Panel: Recent Labs  Lab 10/20/18 2011  NA 140  K 3.8  CL 102  CO2 30  GLUCOSE 101*  BUN 19  CREATININE 0.50  CALCIUM 9.0   GFR: Estimated Creatinine Clearance: 36.4 mL/min (by C-G formula based on SCr of 0.5 mg/dL). Liver Function Tests: Recent Labs  Lab 10/20/18 2011  AST 24  ALT 18  ALKPHOS 78  BILITOT 0.6  PROT 7.1  ALBUMIN 4.1   No results for input(s): LIPASE, AMYLASE in the last 168 hours. No results for input(s): AMMONIA in the last 168 hours. Coagulation Profile: No results for input(s): INR, PROTIME in the last 168 hours. Cardiac Enzymes: No results for input(s): CKTOTAL, CKMB, CKMBINDEX, TROPONINI in the last 168 hours. BNP (last 3 results) No results for input(s): PROBNP in the last 8760 hours. HbA1C: No results for input(s): HGBA1C in the last 72 hours. CBG: No results for input(s): GLUCAP in the last 168 hours. Lipid Profile: No results for input(s): CHOL, HDL, LDLCALC, TRIG, CHOLHDL, LDLDIRECT in the last 72 hours. Thyroid Function Tests: No results for input(s): TSH, T4TOTAL, FREET4, T3FREE, THYROIDAB in the last 72 hours. Anemia Panel: No results for input(s): VITAMINB12, FOLATE, FERRITIN, TIBC, IRON, RETICCTPCT in the last 72 hours. Urine analysis:    Component Value Date/Time   COLORURINE YELLOW 08/08/2013 1548   APPEARANCEUR CLEAR 08/08/2013 1548   LABSPEC 1.011 08/08/2013 1548   PHURINE 6.5 08/08/2013 1548   GLUCOSEU NEGATIVE 08/08/2013 1548   HGBUR NEGATIVE 08/08/2013 1548   BILIRUBINUR NEGATIVE 08/08/2013 1548   KETONESUR NEGATIVE 08/08/2013 1548   PROTEINUR NEGATIVE 08/08/2013 1548   UROBILINOGEN 0.2 08/08/2013 1548   NITRITE NEGATIVE 08/08/2013 1548   LEUKOCYTESUR NEGATIVE 08/08/2013 1548   Sepsis Labs: @LABRCNTIP (procalcitonin:4,lacticidven:4) )No results found for this or any previous visit (from the past 240 hour(s)).   Radiological Exams on  Admission: No results found.  EKG: Not performed.   Assessment/Plan   1. Left leg cellulitis  - Presents with erythema, edema, and tenderness involving lower left leg that has progressed despite outpatient antibiotics  - DVT was ruled-out with outpatient venous US early in the course  - Blood cultures and vancomycin ordered  from ED  - Continue empiric antibiotic and supportive care, follow culture and clinical course   2. Hypertension  - BP at goal  - Continue amlodipine and benazepril as tolerated    3. Chronic pancreatitis  - Stable, continue pancreatitic enzymes     DVT prophylaxis: Lovenox  Code Status: Full  Family Communication: Discussed with patient  Consults called: None Admission status: Observation     Briscoe Deutscherimothy S Malillany Kazlauskas, MD [facility] Pager [phone]  If 7PM-7AM, please contact night-coverage [email or web address] Password [email or web address]  10/20/2018, 10:23 PM

## 2018-10-21 ENCOUNTER — Encounter (HOSPITAL_COMMUNITY): Payer: Self-pay

## 2018-10-21 ENCOUNTER — Other Ambulatory Visit: Payer: Self-pay

## 2018-10-21 DIAGNOSIS — K861 Other chronic pancreatitis: Secondary | ICD-10-CM | POA: Diagnosis present

## 2018-10-21 DIAGNOSIS — K219 Gastro-esophageal reflux disease without esophagitis: Secondary | ICD-10-CM | POA: Diagnosis present

## 2018-10-21 DIAGNOSIS — K8681 Exocrine pancreatic insufficiency: Secondary | ICD-10-CM | POA: Diagnosis present

## 2018-10-21 DIAGNOSIS — Z7982 Long term (current) use of aspirin: Secondary | ICD-10-CM | POA: Diagnosis not present

## 2018-10-21 DIAGNOSIS — H04129 Dry eye syndrome of unspecified lacrimal gland: Secondary | ICD-10-CM | POA: Diagnosis present

## 2018-10-21 DIAGNOSIS — K589 Irritable bowel syndrome without diarrhea: Secondary | ICD-10-CM | POA: Diagnosis present

## 2018-10-21 DIAGNOSIS — M810 Age-related osteoporosis without current pathological fracture: Secondary | ICD-10-CM | POA: Diagnosis present

## 2018-10-21 DIAGNOSIS — N3289 Other specified disorders of bladder: Secondary | ICD-10-CM | POA: Diagnosis present

## 2018-10-21 DIAGNOSIS — I10 Essential (primary) hypertension: Secondary | ICD-10-CM | POA: Diagnosis present

## 2018-10-21 DIAGNOSIS — N3281 Overactive bladder: Secondary | ICD-10-CM | POA: Diagnosis present

## 2018-10-21 DIAGNOSIS — Z841 Family history of disorders of kidney and ureter: Secondary | ICD-10-CM | POA: Diagnosis not present

## 2018-10-21 DIAGNOSIS — Z823 Family history of stroke: Secondary | ICD-10-CM | POA: Diagnosis not present

## 2018-10-21 DIAGNOSIS — L03116 Cellulitis of left lower limb: Secondary | ICD-10-CM | POA: Diagnosis not present

## 2018-10-21 DIAGNOSIS — Z881 Allergy status to other antibiotic agents status: Secondary | ICD-10-CM | POA: Diagnosis not present

## 2018-10-21 DIAGNOSIS — Z801 Family history of malignant neoplasm of trachea, bronchus and lung: Secondary | ICD-10-CM | POA: Diagnosis not present

## 2018-10-21 DIAGNOSIS — Z808 Family history of malignant neoplasm of other organs or systems: Secondary | ICD-10-CM | POA: Diagnosis not present

## 2018-10-21 DIAGNOSIS — Z79899 Other long term (current) drug therapy: Secondary | ICD-10-CM | POA: Diagnosis not present

## 2018-10-21 DIAGNOSIS — L0390 Cellulitis, unspecified: Secondary | ICD-10-CM | POA: Diagnosis present

## 2018-10-21 DIAGNOSIS — H409 Unspecified glaucoma: Secondary | ICD-10-CM | POA: Diagnosis present

## 2018-10-21 DIAGNOSIS — Z8249 Family history of ischemic heart disease and other diseases of the circulatory system: Secondary | ICD-10-CM | POA: Diagnosis not present

## 2018-10-21 DIAGNOSIS — Z87891 Personal history of nicotine dependence: Secondary | ICD-10-CM | POA: Diagnosis not present

## 2018-10-21 DIAGNOSIS — Z800 Family history of malignant neoplasm of digestive organs: Secondary | ICD-10-CM | POA: Diagnosis not present

## 2018-10-21 DIAGNOSIS — E785 Hyperlipidemia, unspecified: Secondary | ICD-10-CM | POA: Diagnosis present

## 2018-10-21 DIAGNOSIS — R682 Dry mouth, unspecified: Secondary | ICD-10-CM | POA: Diagnosis present

## 2018-10-21 LAB — BASIC METABOLIC PANEL
ANION GAP: 7 (ref 5–15)
BUN: 19 mg/dL (ref 8–23)
CALCIUM: 8.3 mg/dL — AB (ref 8.9–10.3)
CO2: 26 mmol/L (ref 22–32)
Chloride: 106 mmol/L (ref 98–111)
Creatinine, Ser: 0.46 mg/dL (ref 0.44–1.00)
GFR calc Af Amer: >60 mL/min (ref >60)
GFR calc non Af Amer: >60 mL/min (ref >60)
Glucose, Bld: 122 mg/dL — ABNORMAL HIGH (ref 70–99)
Potassium: 3.6 mmol/L (ref 3.5–5.1)
Sodium: 139 mmol/L (ref 135–145)

## 2018-10-21 LAB — CBC WITH DIFFERENTIAL/PLATELET
Abs Immature Granulocytes: 0.02 10*3/uL (ref 0.00–0.07)
Basophils Absolute: 0 10*3/uL (ref 0.0–0.1)
Basophils Relative: 0 %
Eosinophils Absolute: 0.7 10*3/uL — ABNORMAL HIGH (ref 0.0–0.5)
Eosinophils Relative: 10 %
HCT: 36.6 % (ref 36.0–46.0)
Hemoglobin: 11.4 g/dL — ABNORMAL LOW (ref 12.0–15.0)
Immature Granulocytes: 0 %
Lymphocytes Relative: 14 %
Lymphs Abs: 0.9 10*3/uL (ref 0.7–4.0)
MCH: 29.2 pg (ref 26.0–34.0)
MCHC: 31.1 g/dL (ref 30.0–36.0)
MCV: 93.8 fL (ref 80.0–100.0)
Monocytes Absolute: 0.8 10*3/uL (ref 0.1–1.0)
Monocytes Relative: 13 %
Neutro Abs: 4.2 10*3/uL (ref 1.7–7.7)
Neutrophils Relative %: 63 %
Platelets: 216 10*3/uL (ref 150–400)
RBC: 3.9 MIL/uL (ref 3.87–5.11)
RDW: 11.9 % (ref 11.5–15.5)
WBC: 6.6 10*3/uL (ref 4.0–10.5)
nRBC: 0 % (ref 0.0–0.2)

## 2018-10-21 MED ORDER — VANCOMYCIN HCL IN DEXTROSE 750-5 MG/150ML-% IV SOLN: 750.0000 mg | INTRAVENOUS | Status: DC

## 2018-10-21 MED ORDER — LORAZEPAM 2 MG/ML IJ SOLN: 2.0000 mg | Freq: Once | INTRAMUSCULAR | Status: DC

## 2018-10-21 MED ORDER — SODIUM CHLORIDE 0.9 % IV SOLN
1.0000 g | INTRAVENOUS | Status: DC
  Administered 2018-10-21: 1 g via INTRAVENOUS
  Filled 2018-10-21: qty 1
  Filled 2018-10-21 (×2): qty 10

## 2018-10-21 NOTE — Care Management Obs Status (Signed)
MEDICARE OBSERVATION STATUS NOTIFICATION   Patient Details  Name: MAESA PAINE MRN: [id number] Date of Birth: May 06, 1941   Medicare Observation Status Notification Given:  Yes    Golda Acre, RN 10/21/2018, 9:29 AM

## 2018-10-21 NOTE — Progress Notes (Signed)
Pharmacy Antibiotic Note  [name] is a 78 y.o. female admitted on 10/20/2018 with cellulitis.  Pharmacy has been consulted for Vancomycin dosing.  Plan:  Vancomycin 1gm iv x1, then 750mg  iv q36hr Vancomycin 750 mg IV Q 36 hrs. Goal AUC 400-550. Expected AUC: 478 SCr used: 0.8 (adjusted from 0.4)   Height: 4\' 6"  (137.2 cm) Weight: 111 lb (50.3 kg) IBW/kg (Calculated) : 31.7  Temp (24hrs), Avg:98 F (36.7 C), Min:97.3 F (36.3 C), Max:98.6 F (37 C)  Recent Labs  Lab 10/20/18 2011  WBC 8.5  CREATININE 0.50    Estimated Creatinine Clearance: 36.4 mL/min (by C-G formula based on SCr of 0.5 mg/dL).    Allergies  Allergen Reactions  . Codeine     REACTION: Dlizzy  . Doxycycline Itching    Antimicrobials this admission: Vancomycin 10/21/2018 >>    Dose adjustments this admission: -  Microbiology results: -  Thank you for allowing pharmacy to be a part of this patient's care.  Aleene Davidson Crowford 10/21/2018 4:18 AM

## 2018-10-21 NOTE — Progress Notes (Signed)
PROGRESS NOTE    [name]  JKD:[id number] DOB: May 09, 1941 DOA: 10/20/2018 PCP: [name], MD (Inactive)   Brief Narrative:  78 year old with past medical history of essential hypertension, chronic pancreatitis, glaucoma came to the hospital for evaluation of left lower extremity erythema and swelling.  Outpatient she was diagnosed with cellulitis and failed treatment with doxycycline and Keflex therefore came to the hospital.  Upon admission she was started on vancomycin initially but due to nonpurulent cellulitis it was changed to IV Rocephin.  Lower extremity Dopplers negative for DVT.   Assessment & Plan:   Principal Problem:   Left leg cellulitis Active Problems:   Chronic pancreatitis (HCC)   Hypertension  Left lower extremity nonpurulent cellulitis - Erythema still above her knees and quite a bit of warmth and tightness.  Negative for DVT.  No erythema noted therefore will change vancomycin to Rocephin.  Follow-up cultures -Advised nursing staff to demarcate this area so it can be followed -Provide supportive care and pain control  Essential hypertension -Resume home medications.  Amlodipine, Benzapril  Chronic pancreatitis -Continue home enzymes-Creon  Hyperlipidemia -Continue statin  GERD -PPI daily  DVT prophylaxis: Lovenox Code Status: Full code Family Communication: None at bedside Disposition Plan: Maintain inpatient stay for at least next 24 hours for IV antibiotic treatment and until we notice improvement in her cellulitis  Consultants:   None  Procedures:   None  Antimicrobials:   Vancomycin for 1 day  Rocephin starting 2/11   Subjective: Patient still reports of left lower extremity pain, erythema and swelling.  Due to this she is having some difficulty walking.  Review of Systems Otherwise negative except as per HPI, including: General: Denies fever, chills, night sweats or unintended weight loss. Resp: Denies cough, wheezing,  shortness of breath. Cardiac: Denies chest pain, palpitations, orthopnea, paroxysmal nocturnal dyspnea. GI: Denies abdominal pain, nausea, vomiting, diarrhea or constipation GU: Denies dysuria, frequency, hesitancy or incontinence MS: Denies muscle aches, joint pain or swelling Neuro: Denies headache, neurologic deficits (focal weakness, numbness, tingling), abnormal gait Psych: Denies anxiety, depression, SI/HI/AVH Skin: Denies new rashes or lesions ID: Denies sick contacts, exotic exposures, travel  Objective: Vitals:   10/21/18 0040 10/21/18 0538 10/21/18 0538 10/21/18 0538  BP: 122/76 106/61 106/71 106/71  Pulse: 96 75 76 75  Resp: 17 17    Temp: (!) 97.3 F (36.3 C) 98 F (36.7 C) 98 F (36.7 C) 98 F (36.7 C)  TempSrc: Oral Oral Oral Oral  SpO2: 99%  95% 96%  Weight:      Height:        Intake/Output Summary (Last 24 hours) at 10/21/2018 1025 Last data filed at 10/21/2018 0902 Gross per 24 hour  Intake 712.53 ml  Output 0 ml  Net 712.53 ml   Filed Weights   10/20/18 1816  Weight: 50.3 kg    Examination:  General exam: Appears calm and comfortable  Respiratory system: Clear to auscultation. Respiratory effort normal. Cardiovascular system: S1 & S2 heard, RRR. No JVD, murmurs, rubs, gallops or clicks. No pedal edema. Gastrointestinal system: Abdomen is nondistended, soft and nontender. No organomegaly or masses felt. Normal bowel sounds heard. Central nervous system: Alert and oriented. No focal neurological deficits. Extremities: Symmetric 4 x 5 power. Skin: Left lower extremity chronic skin changes with erythema, warmth and swelling all the way up to her thighs. Psychiatry: Judgement and insight appear normal. Mood & affect appropriate.     Data Reviewed:   CBC: Recent Labs  Lab  10/20/18 2011 10/21/18 0403  WBC 8.5 6.6  NEUTROABS 5.9 4.2  HGB 13.2 11.4*  HCT 42.1 36.6  MCV 92.7 93.8  PLT 247 216   Basic Metabolic Panel: Recent Labs  Lab  10/20/18 2011 10/21/18 0403  NA 140 139  K 3.8 3.6  CL 102 106  CO2 30 26  GLUCOSE 101* 122*  BUN 19 19  CREATININE 0.50 0.46  CALCIUM 9.0 8.3*   GFR: Estimated Creatinine Clearance: 36.4 mL/min (by C-G formula based on SCr of 0.46 mg/dL). Liver Function Tests: Recent Labs  Lab 10/20/18 2011  AST 24  ALT 18  ALKPHOS 78  BILITOT 0.6  PROT 7.1  ALBUMIN 4.1   No results for input(s): LIPASE, AMYLASE in the last 168 hours. No results for input(s): AMMONIA in the last 168 hours. Coagulation Profile: No results for input(s): INR, PROTIME in the last 168 hours. Cardiac Enzymes: No results for input(s): CKTOTAL, CKMB, CKMBINDEX, TROPONINI in the last 168 hours. BNP (last 3 results) No results for input(s): PROBNP in the last 8760 hours. HbA1C: No results for input(s): HGBA1C in the last 72 hours. CBG: No results for input(s): GLUCAP in the last 168 hours. Lipid Profile: No results for input(s): CHOL, HDL, LDLCALC, TRIG, CHOLHDL, LDLDIRECT in the last 72 hours. Thyroid Function Tests: No results for input(s): TSH, T4TOTAL, FREET4, T3FREE, THYROIDAB in the last 72 hours. Anemia Panel: No results for input(s): VITAMINB12, FOLATE, FERRITIN, TIBC, IRON, RETICCTPCT in the last 72 hours. Sepsis Labs: No results for input(s): PROCALCITON, LATICACIDVEN in the last 168 hours.  No results found for this or any previous visit (from the past 240 hour(s)).       Radiology Studies: No results found.      Scheduled Meds: . amLODipine  5 mg Oral Daily  . aspirin EC  81 mg Oral QPC breakfast  . benazepril  20 mg Oral Daily  . cetaphil  1 application Topical BID  . enoxaparin (LOVENOX) injection  40 mg Subcutaneous QHS  . latanoprost  1 drop Both Eyes QHS  . lipase/protease/amylase  24,000 Units Oral TID WC  . LORazepam  2 mg Intravenous Once  . pantoprazole  40 mg Oral Daily  . polyvinyl alcohol  1 drop Both Eyes QID  . pravastatin  20 mg Oral q1800  . timolol  1 drop  Both Eyes BID   Continuous Infusions: . sodium chloride 100 mL/hr at 10/21/18 0200  . [START ON 10/22/2018] vancomycin       LOS: 0 days   Time spent= 35 mins     Joline Maxcy, MD [facility]  If 7PM-7AM, please contact night-coverage [email or web address] 10/21/2018, 10:25 AM

## 2018-10-22 LAB — CBC
HCT: 37.1 % (ref 36.0–46.0)
Hemoglobin: 11.5 g/dL — ABNORMAL LOW (ref 12.0–15.0)
MCH: 28.9 pg (ref 26.0–34.0)
MCHC: 31 g/dL (ref 30.0–36.0)
MCV: 93.2 fL (ref 80.0–100.0)
Platelets: 214 10*3/uL (ref 150–400)
RBC: 3.98 MIL/uL (ref 3.87–5.11)
RDW: 11.8 % (ref 11.5–15.5)
WBC: 4.7 10*3/uL (ref 4.0–10.5)
nRBC: 0 % (ref 0.0–0.2)

## 2018-10-22 LAB — BASIC METABOLIC PANEL
Anion gap: 7 (ref 5–15)
BUN: 17 mg/dL (ref 8–23)
CALCIUM: 8.5 mg/dL — AB (ref 8.9–10.3)
CO2: 28 mmol/L (ref 22–32)
CREATININE: 0.53 mg/dL (ref 0.44–1.00)
Chloride: 105 mmol/L (ref 98–111)
GFR calc Af Amer: >60 mL/min (ref >60)
GFR calc non Af Amer: >60 mL/min (ref >60)
Glucose, Bld: 101 mg/dL — ABNORMAL HIGH (ref 70–99)
Potassium: 4 mmol/L (ref 3.5–5.1)
Sodium: 140 mmol/L (ref 135–145)

## 2018-10-22 LAB — MAGNESIUM: Magnesium: 2 mg/dL (ref 1.7–2.4)

## 2018-10-22 MED ORDER — CEPHALEXIN 500 MG PO CAPS: 500.0000 mg | ORAL_CAPSULE | Freq: Four times a day (QID) | ORAL | 0 refills | Status: AC

## 2018-10-22 MED ORDER — ALUM & MAG HYDROXIDE-SIMETH 200-200-20 MG/5ML PO SUSP
30.0000 mL | Freq: Four times a day (QID) | ORAL | Status: DC | PRN
  Filled 2018-10-22: qty 30

## 2018-10-22 NOTE — Discharge Summary (Signed)
Physician Discharge Summary  [name] [id number] DOB: 07/12/1941 DOA: 10/20/2018  PCP: Shaune PollackGates, Donna, MD (Inactive)  Admit date: 10/20/2018 Discharge date: 10/22/2018  Time spent: 25 minutes  Recommendations for Outpatient Follow-up:  1. Needs outpatient Chem-12 as well as CBC 2. Complete Keflex for 10 more days supply in the outpatient setting  Discharge Diagnoses:  Principal Problem:   Left leg cellulitis Active Problems:   Chronic pancreatitis (HCC)   Hypertension   Cellulitis   Discharge Condition: Improved  Diet recommendation: Heart healthy  Filed Weights   10/20/18 1816  Weight: 50.3 kg    History of present illness:  78 year old with past medical history of essential hypertension, chronic pancreatitis, glaucoma came to the hospital for evaluation of left lower extremity erythema and swelling.  Outpatient she was diagnosed with cellulitis and failed treatment with doxycycline and Keflex therefore came to the hospital.  Upon admission she was started on vancomycin initially but due to nonpurulent cellulitis it was changed to IV Rocephin.  Lower extremity Dopplers negative for DVT.  Hospital Course:  Left lower extremity nonpurulent cellulitis - Patient was initially on vancomycin and Rocephin and this was transitioned to Rocephin and then to Keflex on discharge.  Follow-up cultures revealed no growth of anything on her blood -Skin erythema decreased significantly and was stabilized on discharge  Essential hypertension -Resume home medications.  Amlodipine, Benzapril-no changes on discharge  Chronic pancreatitis -Continue home enzymes-Creon-I have resumed her prior to admission Imodium  Hyperlipidemia -Continue statin  GERD -PPI daily   Discharge Exam: Vitals:   10/21/18 2132 10/22/18 0539  BP: 133/85 115/65  Pulse: 98 75  Resp: 16 16  Temp: 98.1 F (36.7 C) 97.8 F (36.6 C)  SpO2: 98% 96%    General: Awake alert pleasant some pain in the  right shoulder no distress no fever no chills no nausea Cardiovascular: S1-S2 no murmur rub or gallop no rales Respiratory: No rales no rhonchi no added sound Lower extremities are soft on the right side on left side slight tenseness and swelling of the left calf but erythema has receded from above the calf area and is localized only in calf and pretibial area  Discharge Instructions   Discharge Instructions    Diet - low sodium heart healthy   Complete by:  As directed    Discharge instructions   Complete by:  As directed    Please complete all of the antibiotics that we have called in for you and go see your regular physician in about 1 week's time You may require lab work in about 1 week and you may require some testing for your right shoulder however I would only take Tylenol for pain Continue your regular meds without change   Increase activity slowly   Complete by:  As directed      Allergies as of 10/22/2018      Reactions   Codeine    REACTION: Dlizzy   Doxycycline Itching      Medication List    TAKE these medications   acetaminophen 500 MG tablet Commonly known as:  TYLENOL Take 500-1,000 mg by mouth every 6 (six) hours as needed for moderate pain or headache.   alosetron 1 MG tablet Commonly known as:  LOTRONEX TAKE 1 TABLET BY MOUTH TWICE A DAY   amLODipine-benazepril 5-20 MG capsule Commonly known as:  LOTREL Take 1 capsule by mouth daily after breakfast. Patient took at home prior to arrival to Short Stay   aspirin 81  MG tablet Take 81 mg by mouth daily after breakfast.   BENEFIBER Tabs Take 2 tablets by mouth 2 (two) times daily.   carboxymethylcellulose 0.5 % Soln Commonly known as:  REFRESH PLUS Place 1 drop into both eyes 4 (four) times daily.   CENTRUM ULTRA WOMENS Tabs Take 1 tablet by mouth daily.   cephALEXin 500 MG capsule Commonly known as:  KEFLEX Take 1 capsule (500 mg total) by mouth 4 (four) times daily for 10 days.    cholecalciferol 10 MCG (400 UNIT) Tabs tablet Commonly known as:  VITAMIN D3 Take 400 Units by mouth daily.   CREON 24000-76000 units Cpep Generic drug:  Pancrelipase (Lip-Prot-Amyl) TAKE 3 CAPSULES BY MOUTH 3 TIMES A DAY WITH MEALS What changed:  See the new instructions.   CREON 24000-76000 units Cpep Generic drug:  Pancrelipase (Lip-Prot-Amyl) TAKE 3 CAPSULES (72,000 UNITS TOTAL) BY MOUTH 3 (THREE) TIMES DAILY WITH MEALS. 1 W/ SNACK What changed:  Another medication with the same name was changed. Make sure you understand how and when to take each.   denosumab 60 MG/ML Soln injection Commonly known as:  PROLIA Inject 60 mg into the skin every 6 (six) months. Administer in upper arm, thigh, or abdomen   loperamide 2 MG capsule Commonly known as:  IMODIUM Take 2 mg by mouth every morning.   omeprazole 20 MG capsule Commonly known as:  PRILOSEC TAKE 1 CAPSULE BY MOUTH EVERY DAY What changed:  how much to take   pravastatin 20 MG tablet Commonly known as:  PRAVACHOL Take 20 mg by mouth daily.   timolol 0.5 % ophthalmic solution Commonly known as:  BETIMOL Place 1 drop into both eyes 3 (three) times daily.   ZIOPTAN 0.0015 % Soln Generic drug:  Tafluprost Apply 1 drop to eye at bedtime.      Allergies  Allergen Reactions  . Codeine     REACTION: Dlizzy  . Doxycycline Itching      The results of significant diagnostics from this hospitalization (including imaging, microbiology, ancillary and laboratory) are listed below for reference.    Significant Diagnostic Studies: [place] Venous Img Lower Unilateral Left  Result Date: 10/07/2018 CLINICAL DATA:  Left lower extremity pain and edema for the past 2 weeks. Evaluate for DVT. EXAM: LEFT LOWER EXTREMITY VENOUS DOPPLER ULTRASOUND TECHNIQUE: Gray-scale sonography with graded compression, as well as color Doppler and duplex ultrasound were performed to evaluate the lower extremity deep venous systems from the level of the  common femoral vein and including the common femoral, femoral, profunda femoral, popliteal and calf veins including the posterior tibial, peroneal and gastrocnemius veins when visible. The superficial great saphenous vein was also interrogated. Spectral Doppler was utilized to evaluate flow at rest and with distal augmentation maneuvers in the common femoral, femoral and popliteal veins. COMPARISON:  None. FINDINGS: Contralateral Common Femoral Vein: Respiratory phasicity is normal and symmetric with the symptomatic side. No evidence of thrombus. Normal compressibility. Common Femoral Vein: No evidence of thrombus. Normal compressibility, respiratory phasicity and response to augmentation. Saphenofemoral Junction: No evidence of thrombus. Normal compressibility and flow on color Doppler imaging. Profunda Femoral Vein: No evidence of thrombus. Normal compressibility and flow on color Doppler imaging. Femoral Vein: No evidence of thrombus. Normal compressibility, respiratory phasicity and response to augmentation. Popliteal Vein: No evidence of thrombus. Normal compressibility, respiratory phasicity and response to augmentation. Calf Veins: No evidence of thrombus. Normal compressibility and flow on color Doppler imaging. Superficial Great Saphenous Vein: No evidence of thrombus.  Normal compressibility. Venous Reflux:  None. Other Findings: A minimal amount of subcutaneous edema is noted at the level of the left calf. IMPRESSION: No evidence of DVT within the left lower extremity. Electronically Signed   By: Simonne Come M.D.   On: 10/07/2018 12:51    Microbiology: Recent Results (from the past 240 hour(s))  Culture, blood (routine x 2)     Status: None (Preliminary result)   Collection Time: 10/20/18 10:01 PM  Result Value Ref Range Status   Specimen Description   Final    BLOOD LEFT ANTECUBITAL Performed at [facility], [address], [place], [place] [address]    Special Requests    Final    BOTTLES DRAWN AEROBIC AND ANAEROBIC Blood Culture results may not be optimal due to an excessive volume of blood received in culture bottles Performed at [facility], [address] [place], [place] [address]    Culture   Final    NO GROWTH 1 DAY Performed at [facility], [address] [place], [place] [address]    Report Status PENDING  Incomplete  Culture, blood (routine x 2)     Status: None (Preliminary result)   Collection Time: 10/20/18 11:11 PM  Result Value Ref Range Status   Specimen Description   Final    BLOOD BLOOD LEFT FOREARM Performed at [facility], [address], [place], [place] [address]    Special Requests   Final    BOTTLES DRAWN AEROBIC AND ANAEROBIC Blood Culture adequate volume Performed at [facility], [address] [place], [place] [address]    Culture   Final    NO GROWTH 1 DAY Performed at [facility], [address], [place], [place] [address]    Report Status PENDING  Incomplete     Labs: Basic Metabolic Panel: Recent Labs  Lab 10/20/18 2011 10/21/18 0403 10/22/18 0532  NA 140 139 140  K 3.8 3.6 4.0  CL 102 106 105  CO2 30 26 28   GLUCOSE 101* 122* 101*  BUN 19 19 17   CREATININE 0.50 0.46 0.53  CALCIUM 9.0 8.3* 8.5*  MG  --   --  2.0   Liver Function Tests: Recent Labs  Lab 10/20/18 2011  AST 24  ALT 18  ALKPHOS 78  BILITOT 0.6  PROT 7.1  ALBUMIN 4.1   No results for input(s): LIPASE, AMYLASE in the last 168 hours. No results for input(s): AMMONIA in the last 168 hours. CBC: Recent Labs  Lab 10/20/18 2011 10/21/18 0403 10/22/18 0532  WBC 8.5 6.6 4.7  NEUTROABS 5.9 4.2  --   HGB 13.2 11.4* 11.5*  HCT 42.1 36.6 37.1  MCV 92.7 93.8 93.2  PLT 247 216 214   Cardiac Enzymes: No results for input(s): CKTOTAL, CKMB, CKMBINDEX, TROPONINI in the last 168 hours. BNP: BNP (last 3 results) No results for input(s): BNP in the last 8760  hours.  ProBNP (last 3 results) No results for input(s): PROBNP in the last 8760 hours.  CBG: No results for input(s): GLUCAP in the last 168 hours.     Signed:  Rhetta Mura MD   [facility] 10/22/2018, 9:03 AM

## 2018-10-22 NOTE — Progress Notes (Signed)
Patient ambulated with a cane 328ft with nurse tech. Patient tolerated well.

## 2018-10-26 LAB — CULTURE, BLOOD (ROUTINE X 2)
Culture: NO GROWTH
Culture: NO GROWTH
Special Requests: ADEQUATE

## 2019-08-10 ENCOUNTER — Telehealth: Payer: Self-pay

## 2019-08-10 NOTE — Telephone Encounter (Signed)
The alosetron has been approved thru 02/07/2020. [facility] informed.

## 2019-08-10 NOTE — Telephone Encounter (Signed)
I have started a re-authorization for patient's alosetron, dx:K58.9, IBS with diarrhea thru [place].

## 2019-08-25 ENCOUNTER — Other Ambulatory Visit: Payer: Self-pay

## 2019-08-25 MED ORDER — ALOSETRON HCL 1 MG PO TABS: 1.0000 mg | ORAL_TABLET | Freq: Two times a day (BID) | ORAL | 2 refills | Status: DC

## 2019-10-13 ENCOUNTER — Other Ambulatory Visit: Payer: Self-pay

## 2019-10-13 ENCOUNTER — Telehealth: Payer: Self-pay

## 2019-10-13 MED ORDER — CREON 24000-76000 UNITS PO CPEP: ORAL_CAPSULE | ORAL | 0 refills | Status: DC

## 2019-10-13 MED ORDER — OMEPRAZOLE 20 MG PO CPDR: 20.0000 mg | DELAYED_RELEASE_CAPSULE | Freq: Every day | ORAL | 0 refills | Status: DC

## 2019-10-13 NOTE — Telephone Encounter (Signed)
Refill request faxed from pharmacy for Creon. 30 days sent to pharmacy patient needs a follow up for further refills.

## 2019-11-10 ENCOUNTER — Other Ambulatory Visit: Payer: Self-pay | Admitting: Internal Medicine

## 2019-12-03 ENCOUNTER — Other Ambulatory Visit: Payer: Self-pay | Admitting: Internal Medicine

## 2019-12-04 ENCOUNTER — Other Ambulatory Visit: Payer: Self-pay | Admitting: Internal Medicine

## 2019-12-22 DIAGNOSIS — M25511 Pain in right shoulder: Secondary | ICD-10-CM | POA: Diagnosis not present

## 2019-12-25 ENCOUNTER — Other Ambulatory Visit: Payer: Self-pay | Admitting: Internal Medicine

## 2020-01-05 ENCOUNTER — Other Ambulatory Visit: Payer: Self-pay | Admitting: Internal Medicine

## 2020-01-07 ENCOUNTER — Other Ambulatory Visit: Payer: Self-pay | Admitting: Internal Medicine

## 2020-01-07 NOTE — Telephone Encounter (Signed)
Left message for her to call me to set up an office visit.

## 2020-01-08 ENCOUNTER — Other Ambulatory Visit: Payer: Self-pay | Admitting: Internal Medicine

## 2020-01-08 DIAGNOSIS — M25511 Pain in right shoulder: Secondary | ICD-10-CM | POA: Diagnosis not present

## 2020-01-11 ENCOUNTER — Other Ambulatory Visit: Payer: Self-pay | Admitting: Internal Medicine

## 2020-01-11 NOTE — Telephone Encounter (Signed)
Pt requested a refill for Creon.  She is scheduled for an OV 02/10/20.

## 2020-01-11 NOTE — Telephone Encounter (Signed)
Creon refilled.

## 2020-02-02 ENCOUNTER — Other Ambulatory Visit: Payer: Self-pay | Admitting: Internal Medicine

## 2020-02-04 ENCOUNTER — Other Ambulatory Visit: Payer: Self-pay | Admitting: Internal Medicine

## 2020-02-06 ENCOUNTER — Other Ambulatory Visit: Payer: Self-pay | Admitting: Internal Medicine

## 2020-02-10 ENCOUNTER — Ambulatory Visit: Payer: Medicare PPO | Admitting: Internal Medicine

## 2020-02-10 ENCOUNTER — Encounter: Payer: Self-pay | Admitting: Internal Medicine

## 2020-02-10 VITALS — BP 122/76 | HR 78 | Ht <= 58 in | Wt 102.6 lb

## 2020-02-10 DIAGNOSIS — K219 Gastro-esophageal reflux disease without esophagitis: Secondary | ICD-10-CM

## 2020-02-10 DIAGNOSIS — K861 Other chronic pancreatitis: Secondary | ICD-10-CM | POA: Diagnosis not present

## 2020-02-10 DIAGNOSIS — K580 Irritable bowel syndrome with diarrhea: Secondary | ICD-10-CM | POA: Diagnosis not present

## 2020-02-10 MED ORDER — CREON 36000-114000 UNITS PO CPEP: ORAL_CAPSULE | ORAL | 11 refills | Status: DC

## 2020-02-10 MED ORDER — OMEPRAZOLE 20 MG PO CPDR: 20.0000 mg | DELAYED_RELEASE_CAPSULE | Freq: Every day | ORAL | 11 refills | Status: DC

## 2020-02-10 MED ORDER — ALOSETRON HCL 1 MG PO TABS: 1.0000 mg | ORAL_TABLET | Freq: Two times a day (BID) | ORAL | 11 refills | Status: DC

## 2020-02-10 NOTE — Patient Instructions (Addendum)
We have sent the following medications to your pharmacy for you to pick up at your convenience: Creon, alosetron, omeprazole  Follow up with Dr Leone Payor in a year or sooner if needed.   I appreciate the opportunity to care for you. Stan Head, MD, [facility]

## 2020-02-10 NOTE — Progress Notes (Signed)
[name] 79 y.o. 1940/11/01 [id number]  Assessment & Plan:   Encounter Diagnoses  Name Primary?  . Idiopathic chronic pancreatitis (HCC) Yes  . Irritable bowel syndrome with diarrhea   . Gastroesophageal reflux disease, unspecified whether esophagitis present     I am going to see if she improves with a higher dose of Creon.  Use the 36,000 units 3 with meals and 1 with snacks.  Otherwise refill alosetron and omeprazole as previously.  Return in 1 year sooner as needed.   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  Symptoms are stable but the diarrhea with soft mushy stool still bothers her at times.  Her weight is down some but she feels well otherwise.  She is vaccinated for Covid. Allergies  Allergen Reactions  . Doxycycline Itching  . Codeine Other (See Comments)    REACTION: Dlizzy Feels "loopy" REACTION: Dlizzy Other Reaction: very unsteady Feels "loopy"    Current Meds  Medication Sig  . acetaminophen (TYLENOL) 500 MG tablet Take 500-1,000 mg by mouth every 6 (six) hours as needed for moderate pain or headache.  . alosetron (LOTRONEX) 1 MG tablet TAKE 1 TABLET BY MOUTH TWICE A DAY  . amLODipine-benazepril (LOTREL) 5-20 MG per capsule Take 1 capsule by mouth daily after breakfast. Patient took at home prior to arrival to Short Stay  . carboxymethylcellulose (REFRESH PLUS) 0.5 % SOLN Place 1 drop into both eyes 4 (four) times daily.   . cholecalciferol (VITAMIN D) 400 UNITS TABS tablet Take 400 Units by mouth daily.  Marland Kitchen CREON 24000-76000 units CPEP TAKE 3 CAPSULES BY MOUTH 3 TIMES A DAY WITH MEALS , SEE YOU AT YOUR JUNE APPOINTMENT  . denosumab (PROLIA) 60 MG/ML SOLN injection Inject 60 mg into the skin every 6 (six) months.  Administer in upper arm, thigh, or abdomen  . loperamide (IMODIUM) 2 MG capsule Take 2 mg by mouth every morning.   . Multiple Vitamins-Minerals (CENTRUM ULTRA WOMENS) TABS Take 1 tablet by mouth daily.  Marland Kitchen omeprazole (PRILOSEC) 20 MG capsule Take 1 capsule (20 mg total) by mouth daily. See you at your appointment 02/10/2020  . pravastatin (PRAVACHOL) 20 MG tablet Take 20 mg by mouth daily.  . Tafluprost (ZIOPTAN) 0.0015 % SOLN Apply 1 drop to eye at bedtime.   . timolol (BETIMOL) 0.5 % ophthalmic solution Place 1 drop into both eyes 3 (three) times daily.   . Wheat Dextrin (BENEFIBER) TABS Take 2 tablets by mouth 2 (two) times daily.   Past Medical History:  Diagnosis Date  . Abscessed tooth    STATES TOOTH PULLED RIGHT LOWER MOLAR   AND LEFT LOWER MOLAR ON3/25/13 AND PT ON ANTIBIOTIC  . Acute respiratory failure with hypoxia (HCC) 07/28/2013  . Bladder spasms   . Blood transfusion    HEMORRHAGED  1 WEEK AFTER COLONOSCOPY  -REQUIRED TRANSFUSION  . Chronic pancreatitis (HCC)   . Diverticulosis   . Dry eye syndrome   . Dry mouth   . 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 GERD (gastroesophageal reflux disease)   . GI bleed   . Glaucoma   . Glaucoma   . Hiatal hernia    LARGE HIATAL  HERNIA PER CXR REPORT  . Hypertension   . IBS (irritable bowel syndrome)   . Internal hemorrhoids   . Osteoporosis   . Overactive bladder   . Pancreatitis    past history   . Pelvic floor dysfunction    and rectalprolaspe  . Thoracogenic scoliosis of thoracolumbar region    SEVERE  . 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 BAND HEMORRHOIDECTOMY    . BLADDER SUSPENSION  2002   [place] - w/ hysty  . CATARACT EXTRACTION     bilateral  . COLONOSCOPY  02/03/2008   diverticulosis, internal hemorrhoids  . ESOPHAGOGASTRODUODENOSCOPY N/A 07/28/2013   Procedure:  ESOPHAGOGASTRODUODENOSCOPY (EGD);  Surgeon: Milus Banister, MD;  Location: [facility];  Service: Endoscopy;  Laterality: N/A;  . ESOPHAGOGASTRODUODENOSCOPY N/A 07/28/2013   Procedure: ESOPHAGOGASTRODUODENOSCOPY (EGD);  Surgeon: Milus Banister, MD;  Location: [facility];  Service: Endoscopy;  Laterality: N/A;  . EYE SURGERY     LASER OF BOTH EYES  FOR ELEVATED PRESSURE  . FLEXIBLE SIGMOIDOSCOPY  11/11/2008   internal hemorrhoids  . GASTROSTOMY TUBE PLACEMENT  07/30/13  . HIATAL HERNIA REPAIR    . HIATAL HERNIA REPAIR N/A 07/30/2013   Procedure: Exploratory Laparotomy, Lysis of Adhesions, Reduction of Hiatal Hernia, Repair of Esophogeal Perforations, Dor Fundal Plication, Evacuation of Gastric Bezoar, Gastrostomy Tube Insertion, Upper Endoscopy by Dr. Georgette Dover;  Surgeon: Odis Hollingshead, MD;  Location: [facility];  Service: General;  Laterality: N/A;  . MOUTH SURGERY    . MOUTH SURGERY    . NISSEN FUNDOPLICATION    . STAPLE HEMORRHOIDECTOMY    . UPPER GASTROINTESTINAL ENDOSCOPY  02/12/2008   hiatal henia  . VAGINAL HYSTERECTOMY  2002   A&P repair/bladder sling   Social History   Social History Narrative  . Not on file   family history includes Brain cancer in her sister; Heart disease in her brother and sister; Kidney disease in her mother; Liver cancer in her sister and sister; Lung cancer in her brother; Stroke in her father.   Review of Systems As above  Objective:   Physical Exam BP 122/76   Pulse 78   Ht 4\' 6"  (1.372 m)   Wt 102 lb 9.6 oz (46.5 kg)   SpO2 98%   BMI 24.74 kg/m  Petite Kyphotic Elderly ww NAD abd soft and NT

## 2020-02-11 DIAGNOSIS — H1132 Conjunctival hemorrhage, left eye: Secondary | ICD-10-CM | POA: Diagnosis not present

## 2020-02-28 ENCOUNTER — Other Ambulatory Visit: Payer: Self-pay | Admitting: Internal Medicine

## 2020-03-18 DIAGNOSIS — R7309 Other abnormal glucose: Secondary | ICD-10-CM | POA: Diagnosis not present

## 2020-03-18 DIAGNOSIS — I10 Essential (primary) hypertension: Secondary | ICD-10-CM | POA: Diagnosis not present

## 2020-03-18 DIAGNOSIS — M810 Age-related osteoporosis without current pathological fracture: Secondary | ICD-10-CM | POA: Diagnosis not present

## 2020-03-18 DIAGNOSIS — K219 Gastro-esophageal reflux disease without esophagitis: Secondary | ICD-10-CM | POA: Diagnosis not present

## 2020-03-18 DIAGNOSIS — K861 Other chronic pancreatitis: Secondary | ICD-10-CM | POA: Diagnosis not present

## 2020-03-18 DIAGNOSIS — E559 Vitamin D deficiency, unspecified: Secondary | ICD-10-CM | POA: Diagnosis not present

## 2020-03-18 DIAGNOSIS — E7800 Pure hypercholesterolemia, unspecified: Secondary | ICD-10-CM | POA: Diagnosis not present

## 2020-04-15 ENCOUNTER — Other Ambulatory Visit: Payer: Self-pay | Admitting: Internal Medicine

## 2020-05-11 ENCOUNTER — Telehealth: Payer: Self-pay

## 2020-05-11 NOTE — Telephone Encounter (Signed)
I have started a re-authorization for patient's alosetron for her IBS with diarrhea thru [employer]. ICD-10 code: K58.9. Will await outcome.  While typing this the approval came thru, good to 09/09/2020. Pharmacy informed.

## 2020-05-18 ENCOUNTER — Other Ambulatory Visit: Payer: Self-pay | Admitting: Internal Medicine

## 2020-05-31 DIAGNOSIS — M810 Age-related osteoporosis without current pathological fracture: Secondary | ICD-10-CM | POA: Diagnosis not present

## 2020-06-07 DIAGNOSIS — M810 Age-related osteoporosis without current pathological fracture: Secondary | ICD-10-CM | POA: Diagnosis not present

## 2020-08-09 DIAGNOSIS — Z20822 Contact with and (suspected) exposure to covid-19: Secondary | ICD-10-CM | POA: Diagnosis not present

## 2020-08-31 DIAGNOSIS — Z20822 Contact with and (suspected) exposure to covid-19: Secondary | ICD-10-CM | POA: Diagnosis not present

## 2020-08-31 DIAGNOSIS — Z03818 Encounter for observation for suspected exposure to other biological agents ruled out: Secondary | ICD-10-CM | POA: Diagnosis not present

## 2020-10-17 ENCOUNTER — Telehealth: Payer: Self-pay

## 2020-10-17 NOTE — Telephone Encounter (Signed)
Patient's Alosetron is approved thru 09/09/2021. Pharmacy informed.

## 2020-10-17 NOTE — Telephone Encounter (Signed)
I have started a prior re- authorization for patient's alosetron 1mg  tablets thru [employer] for her IBS with diarrhea K58.9. Will await outcome.

## 2020-10-28 DIAGNOSIS — E559 Vitamin D deficiency, unspecified: Secondary | ICD-10-CM | POA: Diagnosis not present

## 2020-10-28 DIAGNOSIS — R7303 Prediabetes: Secondary | ICD-10-CM | POA: Diagnosis not present

## 2020-10-28 DIAGNOSIS — L309 Dermatitis, unspecified: Secondary | ICD-10-CM | POA: Diagnosis not present

## 2020-10-28 DIAGNOSIS — Z0000 Encounter for general adult medical examination without abnormal findings: Secondary | ICD-10-CM | POA: Diagnosis not present

## 2020-10-28 DIAGNOSIS — E7800 Pure hypercholesterolemia, unspecified: Secondary | ICD-10-CM | POA: Diagnosis not present

## 2020-10-28 DIAGNOSIS — M810 Age-related osteoporosis without current pathological fracture: Secondary | ICD-10-CM | POA: Diagnosis not present

## 2020-10-28 DIAGNOSIS — Z1239 Encounter for other screening for malignant neoplasm of breast: Secondary | ICD-10-CM | POA: Diagnosis not present

## 2020-10-28 DIAGNOSIS — I10 Essential (primary) hypertension: Secondary | ICD-10-CM | POA: Diagnosis not present

## 2020-11-15 DIAGNOSIS — L820 Inflamed seborrheic keratosis: Secondary | ICD-10-CM | POA: Diagnosis not present

## 2020-11-16 DIAGNOSIS — Z1231 Encounter for screening mammogram for malignant neoplasm of breast: Secondary | ICD-10-CM | POA: Diagnosis not present

## 2021-01-27 DIAGNOSIS — M810 Age-related osteoporosis without current pathological fracture: Secondary | ICD-10-CM | POA: Diagnosis not present

## 2021-02-14 ENCOUNTER — Other Ambulatory Visit: Payer: Self-pay | Admitting: Internal Medicine

## 2021-03-03 ENCOUNTER — Other Ambulatory Visit: Payer: Self-pay | Admitting: Internal Medicine

## 2021-03-04 ENCOUNTER — Other Ambulatory Visit: Payer: Self-pay | Admitting: Internal Medicine

## 2021-03-16 DIAGNOSIS — H401131 Primary open-angle glaucoma, bilateral, mild stage: Secondary | ICD-10-CM | POA: Diagnosis not present

## 2021-04-19 DIAGNOSIS — M25511 Pain in right shoulder: Secondary | ICD-10-CM | POA: Diagnosis not present

## 2021-04-19 DIAGNOSIS — E7800 Pure hypercholesterolemia, unspecified: Secondary | ICD-10-CM | POA: Diagnosis not present

## 2021-04-19 DIAGNOSIS — I10 Essential (primary) hypertension: Secondary | ICD-10-CM | POA: Diagnosis not present

## 2021-04-19 DIAGNOSIS — K861 Other chronic pancreatitis: Secondary | ICD-10-CM | POA: Diagnosis not present

## 2021-04-19 DIAGNOSIS — R7303 Prediabetes: Secondary | ICD-10-CM | POA: Diagnosis not present

## 2021-04-28 DIAGNOSIS — M25511 Pain in right shoulder: Secondary | ICD-10-CM | POA: Diagnosis not present

## 2021-06-05 ENCOUNTER — Other Ambulatory Visit: Payer: Self-pay | Admitting: Internal Medicine

## 2021-06-05 DIAGNOSIS — M810 Age-related osteoporosis without current pathological fracture: Secondary | ICD-10-CM | POA: Diagnosis not present

## 2021-06-08 ENCOUNTER — Other Ambulatory Visit (HOSPITAL_BASED_OUTPATIENT_CLINIC_OR_DEPARTMENT_OTHER): Payer: Self-pay | Admitting: Internal Medicine

## 2021-06-08 DIAGNOSIS — I10 Essential (primary) hypertension: Secondary | ICD-10-CM | POA: Diagnosis not present

## 2021-06-08 DIAGNOSIS — M6281 Muscle weakness (generalized): Secondary | ICD-10-CM | POA: Diagnosis not present

## 2021-06-08 DIAGNOSIS — R4189 Other symptoms and signs involving cognitive functions and awareness: Secondary | ICD-10-CM

## 2021-06-08 DIAGNOSIS — R400 Somnolence: Secondary | ICD-10-CM

## 2021-06-08 DIAGNOSIS — Z79899 Other long term (current) drug therapy: Secondary | ICD-10-CM | POA: Diagnosis not present

## 2021-06-08 DIAGNOSIS — F32A Depression, unspecified: Secondary | ICD-10-CM | POA: Diagnosis not present

## 2021-06-16 ENCOUNTER — Ambulatory Visit (HOSPITAL_BASED_OUTPATIENT_CLINIC_OR_DEPARTMENT_OTHER)
Admission: RE | Admit: 2021-06-16 | Discharge: 2021-06-16 | Disposition: A | Discharge status: Home / Self Care | Payer: Medicare PPO | Source: Ambulatory Visit | Attending: Internal Medicine | Admitting: Internal Medicine

## 2021-06-16 ENCOUNTER — Other Ambulatory Visit: Payer: Self-pay

## 2021-06-16 DIAGNOSIS — R400 Somnolence: Secondary | ICD-10-CM | POA: Diagnosis not present

## 2021-06-16 DIAGNOSIS — R4189 Other symptoms and signs involving cognitive functions and awareness: Secondary | ICD-10-CM | POA: Diagnosis not present

## 2021-06-16 DIAGNOSIS — R9082 White matter disease, unspecified: Secondary | ICD-10-CM | POA: Diagnosis not present

## 2021-06-21 ENCOUNTER — Other Ambulatory Visit: Payer: Self-pay | Admitting: Internal Medicine

## 2021-06-21 NOTE — Telephone Encounter (Signed)
Refilled

## 2021-06-21 NOTE — Telephone Encounter (Signed)
I did a prior authorization awhile back for this rx and its good thru December 2022. May we refill Sir?

## 2021-06-25 ENCOUNTER — Other Ambulatory Visit: Payer: Self-pay | Admitting: Internal Medicine

## 2021-07-06 DIAGNOSIS — R269 Unspecified abnormalities of gait and mobility: Secondary | ICD-10-CM | POA: Diagnosis not present

## 2021-07-06 DIAGNOSIS — R2689 Other abnormalities of gait and mobility: Secondary | ICD-10-CM | POA: Diagnosis not present

## 2021-08-07 DIAGNOSIS — R059 Cough, unspecified: Secondary | ICD-10-CM | POA: Diagnosis not present

## 2021-08-07 DIAGNOSIS — Z20822 Contact with and (suspected) exposure to covid-19: Secondary | ICD-10-CM | POA: Diagnosis not present

## 2021-08-09 ENCOUNTER — Other Ambulatory Visit: Payer: Self-pay | Admitting: Internal Medicine

## 2021-10-01 ENCOUNTER — Other Ambulatory Visit: Payer: Self-pay | Admitting: Internal Medicine

## 2021-10-23 ENCOUNTER — Other Ambulatory Visit: Payer: Self-pay | Admitting: Internal Medicine

## 2021-11-01 ENCOUNTER — Other Ambulatory Visit: Payer: Self-pay | Admitting: Internal Medicine

## 2021-11-14 ENCOUNTER — Other Ambulatory Visit: Payer: Medicare PPO

## 2021-11-24 ENCOUNTER — Other Ambulatory Visit: Payer: Self-pay | Admitting: Internal Medicine

## 2021-11-29 DIAGNOSIS — E7800 Pure hypercholesterolemia, unspecified: Secondary | ICD-10-CM | POA: Diagnosis not present

## 2021-11-29 DIAGNOSIS — Z1239 Encounter for other screening for malignant neoplasm of breast: Secondary | ICD-10-CM | POA: Diagnosis not present

## 2021-11-29 DIAGNOSIS — R7303 Prediabetes: Secondary | ICD-10-CM | POA: Diagnosis not present

## 2021-11-29 DIAGNOSIS — M810 Age-related osteoporosis without current pathological fracture: Secondary | ICD-10-CM | POA: Diagnosis not present

## 2021-11-29 DIAGNOSIS — K861 Other chronic pancreatitis: Secondary | ICD-10-CM | POA: Diagnosis not present

## 2021-11-29 DIAGNOSIS — I10 Essential (primary) hypertension: Secondary | ICD-10-CM | POA: Diagnosis not present

## 2021-11-29 DIAGNOSIS — E559 Vitamin D deficiency, unspecified: Secondary | ICD-10-CM | POA: Diagnosis not present

## 2021-11-29 DIAGNOSIS — G4719 Other hypersomnia: Secondary | ICD-10-CM | POA: Diagnosis not present

## 2021-11-29 DIAGNOSIS — Z0000 Encounter for general adult medical examination without abnormal findings: Secondary | ICD-10-CM | POA: Diagnosis not present

## 2021-12-25 ENCOUNTER — Other Ambulatory Visit: Payer: Self-pay | Admitting: Internal Medicine

## 2021-12-28 ENCOUNTER — Other Ambulatory Visit: Payer: Self-pay | Admitting: Internal Medicine

## 2021-12-31 ENCOUNTER — Other Ambulatory Visit: Payer: Self-pay | Admitting: Internal Medicine

## 2022-01-01 ENCOUNTER — Other Ambulatory Visit: Payer: Self-pay

## 2022-01-01 MED ORDER — CREON 36000-114000 UNITS PO CPEP: ORAL_CAPSULE | ORAL | 0 refills | Status: DC

## 2022-01-01 NOTE — Telephone Encounter (Signed)
[name]'s creon refilled as [facility] requested. Note attached to call for appointment. ?

## 2022-01-25 ENCOUNTER — Other Ambulatory Visit: Payer: Self-pay | Admitting: Internal Medicine

## 2022-02-07 ENCOUNTER — Other Ambulatory Visit: Payer: Self-pay | Admitting: Internal Medicine

## 2022-02-23 ENCOUNTER — Other Ambulatory Visit: Payer: Self-pay | Admitting: Internal Medicine

## 2022-03-09 ENCOUNTER — Other Ambulatory Visit: Payer: Self-pay | Admitting: Internal Medicine

## 2022-03-26 DIAGNOSIS — M25511 Pain in right shoulder: Secondary | ICD-10-CM | POA: Diagnosis not present

## 2022-03-27 DIAGNOSIS — M25511 Pain in right shoulder: Secondary | ICD-10-CM | POA: Diagnosis not present

## 2022-03-30 ENCOUNTER — Other Ambulatory Visit: Payer: Self-pay | Admitting: Internal Medicine

## 2022-04-03 ENCOUNTER — Other Ambulatory Visit: Payer: Self-pay | Admitting: Internal Medicine

## 2022-04-04 DIAGNOSIS — G4719 Other hypersomnia: Secondary | ICD-10-CM | POA: Diagnosis not present

## 2022-04-12 DIAGNOSIS — M4005 Postural kyphosis, thoracolumbar region: Secondary | ICD-10-CM | POA: Diagnosis not present

## 2022-04-12 DIAGNOSIS — R419 Unspecified symptoms and signs involving cognitive functions and awareness: Secondary | ICD-10-CM | POA: Diagnosis not present

## 2022-04-12 DIAGNOSIS — R400 Somnolence: Secondary | ICD-10-CM | POA: Diagnosis not present

## 2022-04-26 ENCOUNTER — Other Ambulatory Visit: Payer: Self-pay | Admitting: Internal Medicine

## 2022-05-08 ENCOUNTER — Other Ambulatory Visit: Payer: Self-pay | Admitting: Internal Medicine

## 2022-05-08 NOTE — Telephone Encounter (Signed)
Pt sch'd appt for 11/2. Would like refills sent if possible. Thank you

## 2022-05-08 NOTE — Telephone Encounter (Signed)
I have left detailed messages on her home # and also her daughter's cell # to call and set up an appointment. Last seen in 2021. Then we will refill the medicine to cover her until she comes in.

## 2022-05-18 ENCOUNTER — Telehealth: Payer: Self-pay | Admitting: Internal Medicine

## 2022-05-18 MED ORDER — CREON 24000-76000 UNITS PO CPEP: ORAL_CAPSULE | ORAL | 5 refills | Status: DC

## 2022-05-18 NOTE — Telephone Encounter (Signed)
I left her daughter  Gavin Pound a detailed message that the rx has been changed.

## 2022-05-18 NOTE — Telephone Encounter (Signed)
Kim from [facility] called, states patient's creon 36000 units is on back order, wondering if something else can be call ib for the time being. Requesting a call back on [phone].

## 2022-05-18 NOTE — Telephone Encounter (Signed)
Per Sel[facility] Batten at [facility] they have creon 24,000 and zenpep 40,000 and zenpep 5,000. Please advise Sir, thank you.

## 2022-05-18 NOTE — Telephone Encounter (Signed)
I sent 24 K U in but she will take 4 of these

## 2022-05-18 NOTE — Telephone Encounter (Signed)
Find out what other strengths are available please  Also if there is a Zenpep close to that available

## 2022-05-18 NOTE — Telephone Encounter (Signed)
Please advise Sir, thank you. 

## 2022-05-19 DIAGNOSIS — M19011 Primary osteoarthritis, right shoulder: Secondary | ICD-10-CM | POA: Diagnosis not present

## 2022-05-22 ENCOUNTER — Other Ambulatory Visit: Payer: Self-pay | Admitting: Internal Medicine

## 2022-06-01 DIAGNOSIS — R7303 Prediabetes: Secondary | ICD-10-CM | POA: Diagnosis not present

## 2022-06-01 DIAGNOSIS — I10 Essential (primary) hypertension: Secondary | ICD-10-CM | POA: Diagnosis not present

## 2022-06-01 DIAGNOSIS — E559 Vitamin D deficiency, unspecified: Secondary | ICD-10-CM | POA: Diagnosis not present

## 2022-06-01 DIAGNOSIS — K861 Other chronic pancreatitis: Secondary | ICD-10-CM | POA: Diagnosis not present

## 2022-06-01 DIAGNOSIS — M25511 Pain in right shoulder: Secondary | ICD-10-CM | POA: Diagnosis not present

## 2022-06-01 DIAGNOSIS — E7800 Pure hypercholesterolemia, unspecified: Secondary | ICD-10-CM | POA: Diagnosis not present

## 2022-06-01 DIAGNOSIS — R748 Abnormal levels of other serum enzymes: Secondary | ICD-10-CM | POA: Diagnosis not present

## 2022-06-01 DIAGNOSIS — K649 Unspecified hemorrhoids: Secondary | ICD-10-CM | POA: Diagnosis not present

## 2022-06-05 DIAGNOSIS — M810 Age-related osteoporosis without current pathological fracture: Secondary | ICD-10-CM | POA: Diagnosis not present

## 2022-06-05 DIAGNOSIS — R748 Abnormal levels of other serum enzymes: Secondary | ICD-10-CM | POA: Diagnosis not present

## 2022-06-06 ENCOUNTER — Other Ambulatory Visit: Payer: Self-pay | Admitting: Internal Medicine

## 2022-06-08 DIAGNOSIS — Z780 Asymptomatic menopausal state: Secondary | ICD-10-CM | POA: Diagnosis not present

## 2022-06-08 DIAGNOSIS — M85851 Other specified disorders of bone density and structure, right thigh: Secondary | ICD-10-CM | POA: Diagnosis not present

## 2022-06-08 DIAGNOSIS — M810 Age-related osteoporosis without current pathological fracture: Secondary | ICD-10-CM | POA: Diagnosis not present

## 2022-06-14 ENCOUNTER — Other Ambulatory Visit: Payer: Self-pay | Admitting: Internal Medicine

## 2022-06-14 ENCOUNTER — Other Ambulatory Visit: Payer: Self-pay | Admitting: Family Medicine

## 2022-06-14 DIAGNOSIS — R748 Abnormal levels of other serum enzymes: Secondary | ICD-10-CM

## 2022-06-18 ENCOUNTER — Other Ambulatory Visit: Payer: Self-pay | Admitting: Internal Medicine

## 2022-06-19 DIAGNOSIS — R051 Acute cough: Secondary | ICD-10-CM | POA: Diagnosis not present

## 2022-06-19 DIAGNOSIS — U071 COVID-19: Secondary | ICD-10-CM | POA: Diagnosis not present

## 2022-06-19 DIAGNOSIS — R0682 Tachypnea, not elsewhere classified: Secondary | ICD-10-CM | POA: Diagnosis not present

## 2022-06-19 DIAGNOSIS — R7981 Abnormal blood-gas level: Secondary | ICD-10-CM | POA: Diagnosis not present

## 2022-06-19 DIAGNOSIS — R0981 Nasal congestion: Secondary | ICD-10-CM | POA: Diagnosis not present

## 2022-06-19 DIAGNOSIS — J3489 Other specified disorders of nose and nasal sinuses: Secondary | ICD-10-CM | POA: Diagnosis not present

## 2022-06-19 DIAGNOSIS — R000 Tachycardia, unspecified: Secondary | ICD-10-CM | POA: Diagnosis not present

## 2022-06-25 DIAGNOSIS — M25511 Pain in right shoulder: Secondary | ICD-10-CM | POA: Diagnosis not present

## 2022-07-02 ENCOUNTER — Ambulatory Visit (HOSPITAL_BASED_OUTPATIENT_CLINIC_OR_DEPARTMENT_OTHER)
Admission: RE | Admit: 2022-07-02 | Discharge: 2022-07-02 | Disposition: A | Discharge status: Home / Self Care | Payer: Medicare PPO | Source: Ambulatory Visit | Attending: Family Medicine | Admitting: Family Medicine

## 2022-07-02 DIAGNOSIS — R748 Abnormal levels of other serum enzymes: Secondary | ICD-10-CM | POA: Insufficient documentation

## 2022-07-12 ENCOUNTER — Ambulatory Visit (INDEPENDENT_AMBULATORY_CARE_PROVIDER_SITE_OTHER): Payer: Medicare PPO | Admitting: Internal Medicine

## 2022-07-12 ENCOUNTER — Encounter: Payer: Self-pay | Admitting: Internal Medicine

## 2022-07-12 VITALS — BP 130/90 | HR 94 | Ht 59.0 in | Wt 100.4 lb

## 2022-07-12 DIAGNOSIS — K580 Irritable bowel syndrome with diarrhea: Secondary | ICD-10-CM | POA: Diagnosis not present

## 2022-07-12 DIAGNOSIS — K219 Gastro-esophageal reflux disease without esophagitis: Secondary | ICD-10-CM | POA: Diagnosis not present

## 2022-07-12 DIAGNOSIS — K861 Other chronic pancreatitis: Secondary | ICD-10-CM | POA: Diagnosis not present

## 2022-07-12 MED ORDER — CREON 24000-76000 UNITS PO CPEP: ORAL_CAPSULE | ORAL | 5 refills | Status: DC

## 2022-07-12 MED ORDER — OMEPRAZOLE 20 MG PO CPDR: DELAYED_RELEASE_CAPSULE | ORAL | 3 refills | Status: AC

## 2022-07-12 MED ORDER — ALOSETRON HCL 1 MG PO TABS: 1.0000 mg | ORAL_TABLET | Freq: Two times a day (BID) | ORAL | 3 refills | Status: DC

## 2022-07-12 NOTE — Progress Notes (Signed)
[name] 81 y.o. 10/04/1940 [id number]  Assessment & Plan:   Encounter Diagnoses  Name Primary?   Idiopathic chronic pancreatitis (HCC) Yes   Irritable bowel syndrome with diarrhea    Gastroesophageal reflux disease, unspecified whether esophagitis present      The patient is doing well.  I have refilled Creon omeprazole and alosetron and she will return in 1 year or sooner if needed.  Subjective:   Chief Complaint: Follow-up of IBS, chronic pancreatitis with insufficiency and GERD.  HPI [name] is 73 she is asking for refills on her chronic GI medications.  She has no problems with her IBS with diarrhea and no diarrhea related to suspected chronic pancreatitis and GERD symptoms are under control. She had an abdominal ultrasound of the right upper quadrant ordered by primary care for a mildly elevated alkaline phosphatase and that was negative.  07/02/2022 Wt Readings from Last 3 Encounters:  07/12/22 100 lb 6 oz (45.5 kg)  02/10/20 102 lb 9.6 oz (46.5 kg)  10/20/18 111 lb (50.3 kg)    Allergies  Allergen Reactions   Doxycycline Itching   Codeine Other (See Comments)    REACTION: Dlizzy Feels "loopy" REACTION: Dlizzy Other Reaction: very unsteady Feels "loopy"    Current Meds  Medication Sig   acetaminophen (TYLENOL) 500 MG tablet Take 500-1,000 mg by mouth every 6 (six) hours as needed for moderate pain or headache.   alosetron (LOTRONEX) 1 MG tablet TAKE 1 TABLET BY MOUTH TWICE A DAY   amLODipine-benazepril (LOTREL) 5-20 MG per capsule Take 1 capsule by mouth daily after breakfast. Patient took at home prior to arrival to Short Stay   carboxymethylcellulose (REFRESH PLUS) 0.5 % SOLN Place 1 drop into both eyes 4 (four) times daily.   cholecalciferol (VITAMIN D) 400 UNITS TABS tablet Take 400 Units by mouth daily.   denosumab (PROLIA) 60 MG/ML SOLN injection Inject 60 mg into the skin every 6 (six) months. Administer in upper arm, thigh, or abdomen   loperamide  (IMODIUM) 2 MG capsule Take 2 mg by mouth every morning.    Multiple Vitamins-Minerals (CENTRUM ULTRA WOMENS) TABS Take 1 tablet by mouth daily.   omeprazole (PRILOSEC) 20 MG capsule TAKE 1 CAPSULE BY MOUTH EVERY DAY   Pancrelipase, Lip-Prot-Amyl, (CREON) 24000-76000 units CPEP Take 4 capsules (96,000 Units total) by mouth with breakfast, with lunch, and with evening meal AND 1 capsule (24,000 Units total) with snacks.   pravastatin (PRAVACHOL) 20 MG tablet Take 20 mg by mouth daily.   Tafluprost (ZIOPTAN) 0.0015 % SOLN Apply 1 drop to eye at bedtime.    timolol (BETIMOL) 0.5 % ophthalmic solution Place 1 drop into both eyes 3 (three) times daily.    Wheat Dextrin (BENEFIBER) TABS Take 2 tablets by mouth 2 (two) times daily.   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   calling MD for possible fistula from bladder to vagina-"having urine coming out vagina after pees"   GERD (gastroesophageal reflux disease)    GI bleed  Glaucoma    Glaucoma    Hiatal hernia    LARGE HIATAL HERNIA PER CXR REPORT   Hypertension    IBS (irritable bowel syndrome)    Internal hemorrhoids    Osteoporosis    Overactive bladder    Pancreatitis    past history    Pelvic floor dysfunction    and rectalprolaspe   Thoracogenic scoliosis of thoracolumbar region    SEVERE   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   Past Surgical History:  Procedure Laterality Date   BAND HEMORRHOIDECTOMY     BLADDER SUSPENSION  2002   Grapey - w/ hysty   CATARACT EXTRACTION     bilateral   COLONOSCOPY  02/03/2008   diverticulosis, internal hemorrhoids    ESOPHAGOGASTRODUODENOSCOPY N/A 07/28/2013   Procedure: ESOPHAGOGASTRODUODENOSCOPY (EGD);  Surgeon: Milus Banister, MD;  Location: [facility];  Service: Endoscopy;  Laterality: N/A;   ESOPHAGOGASTRODUODENOSCOPY N/A 07/28/2013   Procedure: ESOPHAGOGASTRODUODENOSCOPY (EGD);  Surgeon: Milus Banister, MD;  Location: [facility];  Service: Endoscopy;  Laterality: N/A;   EYE SURGERY     LASER OF BOTH EYES  FOR ELEVATED PRESSURE   FLEXIBLE SIGMOIDOSCOPY  11/11/2008   internal hemorrhoids   GASTROSTOMY TUBE PLACEMENT  07/30/13   HIATAL HERNIA REPAIR     HIATAL HERNIA REPAIR N/A 07/30/2013   Procedure: Exploratory Laparotomy, Lysis of Adhesions, Reduction of Hiatal Hernia, Repair of Esophogeal Perforations, Dor Fundal Plication, Evacuation of Gastric Bezoar, Gastrostomy Tube Insertion, Upper Endoscopy by Dr. 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   Review of Systems   Objective:   Physical Exam BP (!) 130/90   Pulse 94   Ht 4\' 11"  (1.499 m)   Wt 100 lb 6 oz (45.5 kg)   BMI 20.27 kg/m  Elderly woman short stature Severe kyphoscoliosis Lungs clear Normal heart sounds Abdomen nontender

## 2022-07-12 NOTE — Patient Instructions (Signed)
Due to recent changes in healthcare laws, you may see the results of your imaging and laboratory studies on MyChart before your provider has had a chance to review them.  We understand that in some cases there may be results that are confusing or concerning to you. Not all laboratory results come back in the same time frame and the provider may be waiting for multiple results in order to interpret others.  Please give [place] 48 hours in order for your provider to thoroughly review all the results before contacting the office for clarification of your results.   We have refilled your medicine.   I appreciate the opportunity to care for you. Silvano Rusk, MD, [facility]

## 2022-08-09 ENCOUNTER — Other Ambulatory Visit: Payer: Self-pay | Admitting: Internal Medicine

## 2022-09-01 DIAGNOSIS — G4733 Obstructive sleep apnea (adult) (pediatric): Secondary | ICD-10-CM | POA: Diagnosis not present

## 2022-09-26 ENCOUNTER — Other Ambulatory Visit: Payer: Self-pay | Admitting: Internal Medicine

## 2022-09-27 DIAGNOSIS — M40204 Unspecified kyphosis, thoracic region: Secondary | ICD-10-CM | POA: Diagnosis not present

## 2022-09-27 DIAGNOSIS — G4733 Obstructive sleep apnea (adult) (pediatric): Secondary | ICD-10-CM | POA: Diagnosis not present

## 2022-10-11 DIAGNOSIS — Z1231 Encounter for screening mammogram for malignant neoplasm of breast: Secondary | ICD-10-CM | POA: Diagnosis not present

## 2022-10-22 DIAGNOSIS — M40204 Unspecified kyphosis, thoracic region: Secondary | ICD-10-CM | POA: Diagnosis not present

## 2022-10-22 DIAGNOSIS — G4733 Obstructive sleep apnea (adult) (pediatric): Secondary | ICD-10-CM | POA: Diagnosis not present

## 2022-10-23 DIAGNOSIS — R600 Localized edema: Secondary | ICD-10-CM | POA: Diagnosis not present

## 2022-10-23 DIAGNOSIS — L03115 Cellulitis of right lower limb: Secondary | ICD-10-CM | POA: Diagnosis not present

## 2022-10-25 DIAGNOSIS — R419 Unspecified symptoms and signs involving cognitive functions and awareness: Secondary | ICD-10-CM | POA: Diagnosis not present

## 2022-10-25 DIAGNOSIS — R000 Tachycardia, unspecified: Secondary | ICD-10-CM | POA: Diagnosis not present

## 2022-10-25 DIAGNOSIS — G4733 Obstructive sleep apnea (adult) (pediatric): Secondary | ICD-10-CM | POA: Diagnosis not present

## 2022-10-25 DIAGNOSIS — G4730 Sleep apnea, unspecified: Secondary | ICD-10-CM | POA: Diagnosis not present

## 2022-10-25 DIAGNOSIS — K449 Diaphragmatic hernia without obstruction or gangrene: Secondary | ICD-10-CM | POA: Diagnosis not present

## 2022-10-25 DIAGNOSIS — R0902 Hypoxemia: Secondary | ICD-10-CM | POA: Diagnosis not present

## 2022-10-25 DIAGNOSIS — R269 Unspecified abnormalities of gait and mobility: Secondary | ICD-10-CM | POA: Diagnosis not present

## 2022-10-25 DIAGNOSIS — R413 Other amnesia: Secondary | ICD-10-CM | POA: Diagnosis not present

## 2022-10-25 DIAGNOSIS — L03115 Cellulitis of right lower limb: Secondary | ICD-10-CM | POA: Diagnosis not present

## 2022-11-05 DIAGNOSIS — K589 Irritable bowel syndrome without diarrhea: Secondary | ICD-10-CM | POA: Diagnosis not present

## 2022-11-05 DIAGNOSIS — L0390 Cellulitis, unspecified: Secondary | ICD-10-CM | POA: Diagnosis not present

## 2022-11-05 DIAGNOSIS — R059 Cough, unspecified: Secondary | ICD-10-CM | POA: Diagnosis not present

## 2022-11-05 DIAGNOSIS — R030 Elevated blood-pressure reading, without diagnosis of hypertension: Secondary | ICD-10-CM | POA: Diagnosis not present

## 2022-11-05 DIAGNOSIS — I10 Essential (primary) hypertension: Secondary | ICD-10-CM | POA: Diagnosis not present

## 2022-11-16 DIAGNOSIS — G4733 Obstructive sleep apnea (adult) (pediatric): Secondary | ICD-10-CM | POA: Diagnosis not present

## 2022-11-22 DIAGNOSIS — R2689 Other abnormalities of gait and mobility: Secondary | ICD-10-CM | POA: Diagnosis not present

## 2022-11-26 DIAGNOSIS — M19011 Primary osteoarthritis, right shoulder: Secondary | ICD-10-CM | POA: Diagnosis not present

## 2022-11-30 DIAGNOSIS — I10 Essential (primary) hypertension: Secondary | ICD-10-CM | POA: Diagnosis not present

## 2022-11-30 DIAGNOSIS — L0390 Cellulitis, unspecified: Secondary | ICD-10-CM | POA: Diagnosis not present

## 2022-12-04 DIAGNOSIS — H5213 Myopia, bilateral: Secondary | ICD-10-CM | POA: Diagnosis not present

## 2022-12-04 DIAGNOSIS — H401131 Primary open-angle glaucoma, bilateral, mild stage: Secondary | ICD-10-CM | POA: Diagnosis not present

## 2022-12-17 DIAGNOSIS — G4733 Obstructive sleep apnea (adult) (pediatric): Secondary | ICD-10-CM | POA: Diagnosis not present

## 2022-12-27 DIAGNOSIS — M40204 Unspecified kyphosis, thoracic region: Secondary | ICD-10-CM | POA: Diagnosis not present

## 2022-12-27 DIAGNOSIS — F0670 Mild neurocognitive disorder due to known physiological condition without behavioral disturbance: Secondary | ICD-10-CM | POA: Diagnosis not present

## 2022-12-27 DIAGNOSIS — R413 Other amnesia: Secondary | ICD-10-CM | POA: Diagnosis not present

## 2022-12-27 DIAGNOSIS — G4733 Obstructive sleep apnea (adult) (pediatric): Secondary | ICD-10-CM | POA: Diagnosis not present

## 2022-12-27 DIAGNOSIS — L03119 Cellulitis of unspecified part of limb: Secondary | ICD-10-CM | POA: Diagnosis not present

## 2023-01-07 DIAGNOSIS — R238 Other skin changes: Secondary | ICD-10-CM | POA: Diagnosis not present

## 2023-01-07 DIAGNOSIS — G4733 Obstructive sleep apnea (adult) (pediatric): Secondary | ICD-10-CM | POA: Diagnosis not present

## 2023-01-09 DIAGNOSIS — Z6823 Body mass index (BMI) 23.0-23.9, adult: Secondary | ICD-10-CM | POA: Diagnosis not present

## 2023-01-09 DIAGNOSIS — I10 Essential (primary) hypertension: Secondary | ICD-10-CM | POA: Diagnosis not present

## 2023-01-16 DIAGNOSIS — G4733 Obstructive sleep apnea (adult) (pediatric): Secondary | ICD-10-CM | POA: Diagnosis not present

## 2023-02-15 DIAGNOSIS — G4733 Obstructive sleep apnea (adult) (pediatric): Secondary | ICD-10-CM | POA: Diagnosis not present

## 2023-02-16 DIAGNOSIS — G4733 Obstructive sleep apnea (adult) (pediatric): Secondary | ICD-10-CM | POA: Diagnosis not present

## 2023-02-21 DIAGNOSIS — G4733 Obstructive sleep apnea (adult) (pediatric): Secondary | ICD-10-CM | POA: Diagnosis not present

## 2023-07-16 ENCOUNTER — Other Ambulatory Visit: Payer: Self-pay | Admitting: Internal Medicine

## 2023-08-20 ENCOUNTER — Other Ambulatory Visit: Payer: Self-pay | Admitting: Internal Medicine

## 2023-08-21 ENCOUNTER — Other Ambulatory Visit: Payer: Self-pay | Admitting: Internal Medicine

## 2023-08-21 NOTE — Telephone Encounter (Signed)
I think I already sent this request to you Sir??

## 2023-08-21 NOTE — Telephone Encounter (Signed)
I refilled it for a total of.  She does need to get into see me again before we refill it again.

## 2023-08-22 NOTE — Telephone Encounter (Signed)
I left [name] a detailed message to call us and set up an appointment to continue her refills.

## 2023-08-26 NOTE — Telephone Encounter (Signed)
Left her another message to please call us and set up an appointment.

## 2023-08-27 NOTE — Telephone Encounter (Signed)
I left her daughter Eunice Blase a detailed message to please call and help Mom get an appointment. Her # is [phone].

## 2023-09-06 ENCOUNTER — Telehealth: Payer: Self-pay | Admitting: Internal Medicine

## 2023-09-06 NOTE — Telephone Encounter (Signed)
Patient daughter called and stated that her mother is needing a refill on LOTRONEX 1MG  until her mother is able to be seen on 11/19/2023 at 3:30 PM. Please advise.

## 2023-09-06 NOTE — Telephone Encounter (Signed)
[name] has an appointment set up for March 2025.

## 2023-09-06 NOTE — Telephone Encounter (Signed)
I spoke with daughter Eunice Blase and told her we had refilled Mom's medicine 08/21/2023 and she should have enough until her appointment in March.

## 2023-09-28 ENCOUNTER — Other Ambulatory Visit: Payer: Self-pay | Admitting: Internal Medicine

## 2023-09-30 ENCOUNTER — Telehealth: Payer: Self-pay | Admitting: Internal Medicine

## 2023-09-30 ENCOUNTER — Other Ambulatory Visit: Payer: Self-pay

## 2023-09-30 NOTE — Telephone Encounter (Signed)
Patient's daughter called stating patient was out of Creon and was asking if you could expedite the refill.  She also asked if you could make it for a 90-day supply.  Thank you.

## 2023-10-01 NOTE — Telephone Encounter (Signed)
I left her a detailed message that her creon was sent in yesterday as requested and she has an appointment in March to see Dr Leone Payor.

## 2023-10-01 NOTE — Telephone Encounter (Signed)
I spoke to her daughter Gavin Pound and she said Mom has already gotten her creon.

## 2023-10-01 NOTE — Telephone Encounter (Signed)
PT returned call to discuss creon refill. Please advise

## 2023-11-09 ENCOUNTER — Other Ambulatory Visit: Payer: Self-pay | Admitting: Internal Medicine

## 2023-11-19 ENCOUNTER — Ambulatory Visit: Payer: Medicare PPO | Admitting: Internal Medicine

## 2023-12-30 ENCOUNTER — Other Ambulatory Visit (HOSPITAL_COMMUNITY): Payer: Self-pay

## 2023-12-30 ENCOUNTER — Telehealth: Payer: Self-pay

## 2023-12-30 NOTE — Telephone Encounter (Signed)
 Pharmacy Patient Advocate Encounter  Received notification from [employer] that Prior Authorization for Alosetron  HCl 1MG  tablets has been APPROVED from 12-30-2023 to 09-09-2024   PA #/Case ID/Reference #: [facility]

## 2023-12-30 NOTE — Telephone Encounter (Signed)
 Pharmacy Patient Advocate Encounter   Received notification from CoverMyMeds that prior authorization for Alosetron  HCl 1MG  tablets is required/requested.   Insurance verification completed.   The patient is insured through [employer] .   Per test claim: PA required; PA submitted to above mentioned insurance via CoverMyMeds Key/confirmation #/EOC [facility] Status is pending   **Patient has not been seen since 2023

## 2024-03-29 ENCOUNTER — Other Ambulatory Visit: Payer: Self-pay | Admitting: Internal Medicine

## 2024-07-16 LAB — COMPREHENSIVE METABOLIC PANEL WITH GFR: EGFR: 94

## 2024-07-24 ENCOUNTER — Other Ambulatory Visit (HOSPITAL_COMMUNITY): Payer: Self-pay | Admitting: Internal Medicine

## 2024-07-27 ENCOUNTER — Telehealth (HOSPITAL_COMMUNITY): Payer: Self-pay | Admitting: Pharmacy Technician

## 2024-07-27 NOTE — Telephone Encounter (Signed)
 Auth Submission: NO AUTH NEEDED Site of care: [facility] Payer: [employer] MEDICARE Medication & CPT/J Code(s) submitted: Reclast (Zolendronic acid) J3489 Diagnosis Code: M81.0 Route of submission (phone, fax, portal):  Phone # Fax # Auth type: Buy/Bill HB Units/visits requested: 5mg  x 1 dose, every 12 months Reference number:  Approval from: 07/27/2024 to 09/09/24    Dagoberto Armour, CPhT [facility] Phone: [phone] 07/27/2024

## 2024-08-20 ENCOUNTER — Inpatient Hospital Stay (HOSPITAL_COMMUNITY): Admission: RE | Admit: 2024-08-20 | Discharge: 2024-08-20 | Attending: Internal Medicine

## 2024-08-20 VITALS — BP 135/79 | HR 8 | Temp 97.5°F

## 2024-08-20 DIAGNOSIS — M810 Age-related osteoporosis without current pathological fracture: Secondary | ICD-10-CM | POA: Diagnosis present

## 2024-08-20 MED ORDER — SODIUM CHLORIDE 0.9 % IV SOLN: INTRAVENOUS | Status: DC

## 2024-08-20 MED ORDER — DIPHENHYDRAMINE HCL 25 MG PO CAPS: 25.0000 mg | ORAL_CAPSULE | Freq: Once | ORAL | Status: DC

## 2024-08-20 MED ORDER — ZOLEDRONIC ACID 5 MG/100ML IV SOLN
5.0000 mg | Freq: Once | INTRAVENOUS | Status: AC
  Administered 2024-08-20: 5 mg via INTRAVENOUS

## 2024-08-20 MED ORDER — ZOLEDRONIC ACID 5 MG/100ML IV SOLN
INTRAVENOUS | Status: AC
  Filled 2024-08-20: qty 100

## 2024-08-20 MED ORDER — ACETAMINOPHEN 325 MG PO TABS: 650.0000 mg | ORAL_TABLET | Freq: Once | ORAL | Status: DC
